# Patient Record
Sex: Male | Born: 1960 | Race: White | Hispanic: No | Marital: Married | Smoking: Never smoker
Health system: Southern US, Community
[De-identification: ages and names within clinical notes are randomized; demographics above are authoritative.]

## PROBLEM LIST (undated history)

## (undated) DIAGNOSIS — E7800 Pure hypercholesterolemia, unspecified: Secondary | ICD-10-CM

## (undated) DIAGNOSIS — I209 Angina pectoris, unspecified: Secondary | ICD-10-CM

## (undated) DIAGNOSIS — Z8619 Personal history of other infectious and parasitic diseases: Secondary | ICD-10-CM

## (undated) HISTORY — DX: Personal history of other infectious and parasitic diseases: Z86.19

---

## 2015-06-16 ENCOUNTER — Ambulatory Visit (INDEPENDENT_AMBULATORY_CARE_PROVIDER_SITE_OTHER): Payer: 59 | Admitting: Internal Medicine

## 2015-06-16 ENCOUNTER — Encounter: Payer: Self-pay | Admitting: Internal Medicine

## 2015-06-16 VITALS — BP 100/62 | HR 60 | Temp 97.7°F | Resp 18 | Ht >= 80 in | Wt 237.2 lb

## 2015-06-16 DIAGNOSIS — E7800 Pure hypercholesterolemia, unspecified: Secondary | ICD-10-CM | POA: Diagnosis not present

## 2015-06-16 DIAGNOSIS — Z0000 Encounter for general adult medical examination without abnormal findings: Secondary | ICD-10-CM

## 2015-06-16 DIAGNOSIS — Z13220 Encounter for screening for lipoid disorders: Secondary | ICD-10-CM | POA: Diagnosis not present

## 2015-06-16 DIAGNOSIS — Z23 Encounter for immunization: Secondary | ICD-10-CM

## 2015-06-16 DIAGNOSIS — Z1211 Encounter for screening for malignant neoplasm of colon: Secondary | ICD-10-CM | POA: Diagnosis not present

## 2015-06-16 DIAGNOSIS — Z125 Encounter for screening for malignant neoplasm of prostate: Secondary | ICD-10-CM | POA: Diagnosis not present

## 2015-06-16 DIAGNOSIS — Z9889 Other specified postprocedural states: Secondary | ICD-10-CM

## 2015-06-16 LAB — CBC WITH DIFFERENTIAL/PLATELET
BASOS ABS: 0 10*3/uL (ref 0.0–0.1)
BASOS PCT: 0.8 % (ref 0.0–3.0)
EOS ABS: 0.1 10*3/uL (ref 0.0–0.7)
Eosinophils Relative: 2.1 % (ref 0.0–5.0)
HEMATOCRIT: 43.1 % (ref 39.0–52.0)
Hemoglobin: 14.4 g/dL (ref 13.0–17.0)
LYMPHS ABS: 1.4 10*3/uL (ref 0.7–4.0)
LYMPHS PCT: 28.4 % (ref 12.0–46.0)
MCHC: 33.4 g/dL (ref 30.0–36.0)
MCV: 88.8 fl (ref 78.0–100.0)
MONOS PCT: 9 % (ref 3.0–12.0)
Monocytes Absolute: 0.4 10*3/uL (ref 0.1–1.0)
NEUTROS ABS: 2.9 10*3/uL (ref 1.4–7.7)
NEUTROS PCT: 59.7 % (ref 43.0–77.0)
PLATELETS: 218 10*3/uL (ref 150.0–400.0)
RBC: 4.86 Mil/uL (ref 4.22–5.81)
RDW: 12.4 % (ref 11.5–15.5)
WBC: 4.8 10*3/uL (ref 4.0–10.5)

## 2015-06-16 LAB — COMPREHENSIVE METABOLIC PANEL
ALT: 22 U/L (ref 0–53)
AST: 17 U/L (ref 0–37)
Albumin: 4.1 g/dL (ref 3.5–5.2)
Alkaline Phosphatase: 71 U/L (ref 39–117)
BUN: 18 mg/dL (ref 6–23)
CALCIUM: 8.9 mg/dL (ref 8.4–10.5)
CHLORIDE: 106 meq/L (ref 96–112)
CO2: 29 meq/L (ref 19–32)
Creatinine, Ser: 0.88 mg/dL (ref 0.40–1.50)
GFR: 95.36 mL/min (ref >60.00)
GLUCOSE: 99 mg/dL (ref 70–99)
Potassium: 4.4 mEq/L (ref 3.5–5.1)
Sodium: 141 mEq/L (ref 135–145)
Total Bilirubin: 0.7 mg/dL (ref 0.2–1.2)
Total Protein: 6.7 g/dL (ref 6.0–8.3)

## 2015-06-16 LAB — TSH: TSH: 2.03 u[IU]/mL (ref 0.35–4.50)

## 2015-06-16 LAB — LIPID PANEL
CHOL/HDL RATIO: 6
CHOLESTEROL: 212 mg/dL — AB (ref 0–200)
HDL: 33.7 mg/dL — AB (ref >39.00)
LDL CALC: 159 mg/dL — AB (ref 0–99)
NonHDL: 178.58
TRIGLYCERIDES: 97 mg/dL (ref 0.0–149.0)
VLDL: 19.4 mg/dL (ref 0.0–40.0)

## 2015-06-16 LAB — PSA: PSA: 1.46 ng/mL (ref 0.10–4.00)

## 2015-06-16 NOTE — Progress Notes (Signed)
Pre-visit discussion using our clinic review tool. No additional management support is needed unless otherwise documented below in the visit note.  

## 2015-06-16 NOTE — Progress Notes (Signed)
Patient ID: Jake Wagner, male   DOB: 02/06/1961, 55 y.o.   MRN: [id number]   Subjective:    Patient ID: Jake Wagner, male    DOB: 1960-07-26, 55 y.o.   MRN: [id number]  HPI  Patient here to establish care. He has not had a regular PCP.  He is accompanied by his wife.  History obtained from both of them.  Has been healthy.  Was previously diagnosed with pericarditis.  Was felt to be related to a virus.  S/p left meniscal tear 25-30 years ago.  Some occasional discomfort in his knee.  Overall stable.  No chest pain or tightness.  No sob.  No acid reflux.  No abdominal pain or cramping.  Bowels stable.  Exercises .  He is a Geophysicist/field seismologist.     Past Medical History  Diagnosis Date  . History of chicken pox    No past surgical history on file. Family History  Problem Relation Age of Onset  . Adopted: Yes   Social History   Social History  . Marital Status: Married    Spouse Name: N/A  . Number of Children: N/A  . Years of Education: N/A   Social History Main Topics  . Smoking status: Never Smoker   . Smokeless tobacco: Never Used  . Alcohol Use: 0.0 oz/week    0 Standard drinks or equivalent per week  . Drug Use: None  . Sexual Activity: Not Asked   Other Topics Concern  . None   Social History Narrative    No outpatient encounter prescriptions on file as of 06/16/2015.   No facility-administered encounter medications on file as of 06/16/2015.    Review of Systems  Constitutional: Negative for appetite change and unexpected weight change.  HENT: Negative for congestion and sinus pressure.   Eyes: Negative for pain and visual disturbance.  Respiratory: Negative for cough, chest tightness and shortness of breath.   Cardiovascular: Negative for chest pain, palpitations and leg swelling.  Gastrointestinal: Negative for nausea, vomiting, abdominal pain and diarrhea.  Genitourinary: Negative for dysuria and difficulty urinating.  Musculoskeletal: Negative for back pain  and joint swelling.  Skin: Negative for color change and rash.  Neurological: Negative for dizziness, light-headedness and headaches.  Hematological: Negative for adenopathy. Does not bruise/bleed easily.  Psychiatric/Behavioral: Negative for dysphoric mood and agitation.       Objective:    Physical Exam  Constitutional: He appears well-developed and well-nourished. No distress.  HENT:  Nose: Nose normal.  Mouth/Throat: Oropharynx is clear and moist.  Eyes: Conjunctivae are normal. Right eye exhibits no discharge. Left eye exhibits no discharge.  Neck: Neck supple. No thyromegaly present.  Cardiovascular: Normal rate and regular rhythm.   Pulmonary/Chest: Effort normal and breath sounds normal. No respiratory distress.  Abdominal: Soft. Bowel sounds are normal. There is no tenderness.  Musculoskeletal: He exhibits no edema or tenderness.  Lymphadenopathy:    He has no cervical adenopathy.  Skin: No rash noted. No erythema.  Psychiatric: He has a normal mood and affect. His behavior is normal.    BP 100/62 mmHg  Pulse 60  Temp(Src) 97.7 F (36.5 C) (Oral)  Resp 18  Ht 6\' 8"  (2.032 m)  Wt 237 lb 4 oz (107.616 kg)  BMI 26.06 kg/m2  SpO2 96% Wt Readings from Last 3 Encounters:  06/16/15 237 lb 4 oz (107.616 kg)         Assessment & Plan:   Problem List Items Addressed This Visit    Health  care maintenance    Will schedule him for a complete physical.  Check psa.  Check cholesterol.  Has never had colonoscopy.  Schedule appt with GI for colonoscopy.        History of knee surgery    Occasional pain, but knee overall is doing well.  Follow.        Hypercholesterolemia    Low cholesterol diet and continue exercise.  Check lipid panel.        Relevant Orders   CBC with Differential/Platelet (Completed)   Comprehensive metabolic panel (Completed)   TSH (Completed)   Lipid panel (Completed)    Other Visit Diagnoses    Colon cancer screening    -  Primary     Relevant Orders    Ambulatory referral to Gastroenterology    Screening cholesterol level        Prostate cancer screening        Relevant Orders    PSA (Completed)    Encounter for immunization            Einar Pheasant, MD

## 2015-06-17 ENCOUNTER — Encounter: Payer: Self-pay | Admitting: *Deleted

## 2015-06-22 ENCOUNTER — Encounter: Payer: Self-pay | Admitting: Internal Medicine

## 2015-06-22 DIAGNOSIS — E7800 Pure hypercholesterolemia, unspecified: Secondary | ICD-10-CM | POA: Insufficient documentation

## 2015-06-22 DIAGNOSIS — Z0000 Encounter for general adult medical examination without abnormal findings: Secondary | ICD-10-CM | POA: Insufficient documentation

## 2015-06-22 DIAGNOSIS — Z9889 Other specified postprocedural states: Secondary | ICD-10-CM | POA: Insufficient documentation

## 2015-06-22 NOTE — Assessment & Plan Note (Signed)
Low cholesterol diet and continue exercise.  Check lipid panel.

## 2015-06-22 NOTE — Assessment & Plan Note (Signed)
Will schedule him for a complete physical.  Check psa.  Check cholesterol.  Has never had colonoscopy.  Schedule appt with GI for colonoscopy.

## 2015-06-22 NOTE — Assessment & Plan Note (Signed)
Occasional pain, but knee overall is doing well.  Follow.

## 2015-08-11 LAB — HM COLONOSCOPY: HM Colonoscopy: 3

## 2015-08-19 ENCOUNTER — Encounter: Payer: Self-pay | Admitting: Internal Medicine

## 2015-08-19 DIAGNOSIS — Z86010 Personal history of colonic polyps: Secondary | ICD-10-CM | POA: Insufficient documentation

## 2015-10-14 ENCOUNTER — Ambulatory Visit (INDEPENDENT_AMBULATORY_CARE_PROVIDER_SITE_OTHER): Payer: 59 | Admitting: Internal Medicine

## 2015-10-14 ENCOUNTER — Encounter: Payer: Self-pay | Admitting: Internal Medicine

## 2015-10-14 VITALS — BP 90/60 | HR 56 | Temp 97.6°F | Resp 18 | Ht 79.0 in | Wt 233.8 lb

## 2015-10-14 DIAGNOSIS — E7800 Pure hypercholesterolemia, unspecified: Secondary | ICD-10-CM | POA: Diagnosis not present

## 2015-10-14 DIAGNOSIS — R0602 Shortness of breath: Secondary | ICD-10-CM

## 2015-10-14 DIAGNOSIS — Z0001 Encounter for general adult medical examination with abnormal findings: Secondary | ICD-10-CM

## 2015-10-14 DIAGNOSIS — Z86010 Personal history of colonic polyps: Secondary | ICD-10-CM

## 2015-10-14 DIAGNOSIS — I2510 Atherosclerotic heart disease of native coronary artery without angina pectoris: Secondary | ICD-10-CM | POA: Insufficient documentation

## 2015-10-14 DIAGNOSIS — R5383 Other fatigue: Secondary | ICD-10-CM | POA: Diagnosis not present

## 2015-10-14 DIAGNOSIS — R079 Chest pain, unspecified: Secondary | ICD-10-CM | POA: Diagnosis not present

## 2015-10-14 DIAGNOSIS — Z0000 Encounter for general adult medical examination without abnormal findings: Secondary | ICD-10-CM

## 2015-10-14 LAB — VITAMIN B12: Vitamin B-12: 378 pg/mL (ref 211–911)

## 2015-10-14 LAB — CBC WITH DIFFERENTIAL/PLATELET
Basophils Absolute: 0 10*3/uL (ref 0.0–0.1)
Basophils Relative: 0.7 % (ref 0.0–3.0)
EOS ABS: 0.2 10*3/uL (ref 0.0–0.7)
Eosinophils Relative: 3.4 % (ref 0.0–5.0)
HCT: 43.6 % (ref 39.0–52.0)
HEMOGLOBIN: 14.8 g/dL (ref 13.0–17.0)
LYMPHS ABS: 1.6 10*3/uL (ref 0.7–4.0)
Lymphocytes Relative: 30.5 % (ref 12.0–46.0)
MCHC: 33.9 g/dL (ref 30.0–36.0)
MCV: 88.1 fl (ref 78.0–100.0)
MONO ABS: 0.5 10*3/uL (ref 0.1–1.0)
Monocytes Relative: 9.4 % (ref 3.0–12.0)
NEUTROS PCT: 56 % (ref 43.0–77.0)
Neutro Abs: 3 10*3/uL (ref 1.4–7.7)
Platelets: 217 10*3/uL (ref 150.0–400.0)
RBC: 4.95 Mil/uL (ref 4.22–5.81)
RDW: 13.3 % (ref 11.5–15.5)
WBC: 5.4 10*3/uL (ref 4.0–10.5)

## 2015-10-14 LAB — COMPREHENSIVE METABOLIC PANEL
ALBUMIN: 4.2 g/dL (ref 3.5–5.2)
ALK PHOS: 78 U/L (ref 39–117)
ALT: 38 U/L (ref 0–53)
AST: 20 U/L (ref 0–37)
BILIRUBIN TOTAL: 0.7 mg/dL (ref 0.2–1.2)
BUN: 20 mg/dL (ref 6–23)
CO2: 30 mEq/L (ref 19–32)
CREATININE: 0.91 mg/dL (ref 0.40–1.50)
Calcium: 9.4 mg/dL (ref 8.4–10.5)
Chloride: 105 mEq/L (ref 96–112)
GFR: 91.63 mL/min (ref >60.00)
GLUCOSE: 95 mg/dL (ref 70–99)
Potassium: 4.4 mEq/L (ref 3.5–5.1)
SODIUM: 139 meq/L (ref 135–145)
TOTAL PROTEIN: 7 g/dL (ref 6.0–8.3)

## 2015-10-14 LAB — LIPID PANEL
CHOLESTEROL: 222 mg/dL — AB (ref 0–200)
HDL: 32.5 mg/dL — ABNORMAL LOW (ref >39.00)
LDL Cholesterol: 170 mg/dL — ABNORMAL HIGH (ref 0–99)
NONHDL: 189.22
Total CHOL/HDL Ratio: 7
Triglycerides: 94 mg/dL (ref 0.0–149.0)
VLDL: 18.8 mg/dL (ref 0.0–40.0)

## 2015-10-14 NOTE — Progress Notes (Signed)
Pre-visit discussion using our clinic review tool. No additional management support is needed unless otherwise documented below in the visit note.  

## 2015-10-14 NOTE — Patient Instructions (Signed)
Zantac (ranitidine) 150mg - take one tablet 30 minutes before breakfast.   

## 2015-10-14 NOTE — Progress Notes (Signed)
Patient ID: Jake Wagner, male   DOB: November 18, 1960, 55 y.o.   MRN: [id number]   Subjective:    Patient ID: Jake Wagner, male    DOB: [birth year], 55 y.o.   MRN: [id number]  HPI  Patient here for his physical exam.  He is accompanied by his wife.  History obtained from both of them.  He reports decreased energy.  Also reports that starting one week ago, he developed some left shoulder pain and some chest pressure.  Some constant sensation, but is intermittently worse.  The chest pressure appears to be intermittent.  Had worsening episode yesterday and took Gas X.  Helped.   No acid reflux.  No abdominal pain or cramping.  Bowels stable.  Has also noticed being more sob with stairs.  Eating and drinking.     Past Medical History  Diagnosis Date  . History of chicken pox    No past surgical history on file. Family History  Problem Relation Age of Onset  . Adopted: Yes   Social History   Social History  . Marital Status: Married    Spouse Name: N/A  . Number of Children: N/A  . Years of Education: N/A   Social History Main Topics  . Smoking status: Never Smoker   . Smokeless tobacco: Never Used  . Alcohol Use: 0.0 oz/week    0 Standard drinks or equivalent per week  . Drug Use: None  . Sexual Activity: Not Asked   Other Topics Concern  . None   Social History Narrative    No outpatient encounter prescriptions on file as of 10/14/2015.   No facility-administered encounter medications on file as of 10/14/2015.    Review of Systems  Constitutional: Negative for appetite change and unexpected weight change.  HENT: Negative for congestion and sinus pressure.   Eyes: Negative for pain and visual disturbance.  Respiratory: Positive for chest tightness. Negative for cough. Shortness of breath: has noticed with stairs.    Cardiovascular: Positive for chest pain. Negative for palpitations and leg swelling.  Gastrointestinal: Negative for nausea, vomiting, abdominal pain and  diarrhea.  Genitourinary: Negative for dysuria and difficulty urinating.  Musculoskeletal: Negative for back pain and joint swelling.  Skin: Negative for color change and rash.  Neurological: Negative for dizziness, light-headedness and headaches.  Hematological: Negative for adenopathy. Does not bruise/bleed easily.  Psychiatric/Behavioral: Negative for dysphoric mood and agitation.       Objective:     Blood pressure rechecked by me:  108/68  Physical Exam  Constitutional: He is oriented to person, place, and time. He appears well-developed and well-nourished. No distress.  HENT:  Head: Normocephalic and atraumatic.  Nose: Nose normal.  Mouth/Throat: Oropharynx is clear and moist. No oropharyngeal exudate.  Eyes: Conjunctivae are normal. Right eye exhibits no discharge. Left eye exhibits no discharge.  Neck: Neck supple. No thyromegaly present.  Cardiovascular: Normal rate and regular rhythm.   Pulmonary/Chest: Breath sounds normal. No respiratory distress. He has no wheezes.  Abdominal: Soft. Bowel sounds are normal. There is no tenderness.  Genitourinary:  Rectal exam:  No palpable prostate nodules.  Heme negative.   Musculoskeletal: He exhibits no edema or tenderness.  Lymphadenopathy:    He has no cervical adenopathy.  Neurological: He is alert and oriented to person, place, and time.  Skin: Skin is warm and dry. No rash noted. No erythema.  Psychiatric: He has a normal mood and affect. His behavior is normal.    BP 90/60 mmHg  Pulse  56  Temp(Src) 97.6 F (36.4 C) (Oral)  Resp 18  Ht _0  (2.007 m)  Wt 233 lb 12 oz (106.028 kg)  BMI 26.32 kg/m2  SpO2 97% Wt Readings from Last 3 Encounters:  10/14/15 233 lb 12 oz (106.028 kg)  06/16/15 237 lb 4 oz (107.616 kg)     Lab Results  Component Value Date   WBC 5.4 10/14/2015   HGB 14.8 10/14/2015   HCT 43.6 10/14/2015   PLT 217.0 10/14/2015   GLUCOSE 95 10/14/2015   CHOL 222* 10/14/2015   TRIG 94.0 10/14/2015     HDL 32.50* 10/14/2015   LDLCALC 170* 10/14/2015   ALT 38 10/14/2015   AST 20 10/14/2015   NA 139 10/14/2015   K 4.4 10/14/2015   CL 105 10/14/2015   CREATININE 0.91 10/14/2015   BUN 20 10/14/2015   CO2 30 10/14/2015   TSH 2.03 06/16/2015   PSA 1.46 06/16/2015       Assessment & Plan:   Problem List Items Addressed This Visit    Chest pain - Primary    Describes the chest tightness and shoulder discomfort as outlined.  No reproducible pain on exam.  Good rom.  No pain with movement of palpation.  EKG with SR and TWI in V1 and V2.  Given persistent intermittent symptoms and sob with stairs, I do feel further cardiac w/up warranted.  Refer to cardiology for evaluation.  Will also start zantac 138m q day.  Follow.       Relevant Orders   EKG 12-Lead (Completed)   Ambulatory referral to Cardiology   Fatigue    Increased fatigue.  Decreased energy.  Unclear etiology.  States sleeps well.  Recent tsh wnl.  Check met c, cbc and B12.  Will also pursue cardiac w/up as outlined.        Relevant Orders   Comprehensive metabolic panel (Completed)   CBC with Differential/Platelet (Completed)   Vitamin B12 (Completed)   Health care maintenance    Physical today 10/14/15.  PSA 06/16/15 1.46.  Colonoscopy 08/11/15 as outlined.       History of colonic polyps    Colonoscopy as outlined - 08/11/15.  States due f/u in 10 years.       Hypercholesterolemia    Cholesterol elevated last check.  Had discussed diet and exercise.  Recheck cholesterol levels today.  If persistent elevation, will need to start medication.        Relevant Orders   Lipid panel (Completed)    Other Visit Diagnoses    SOB (shortness of breath)        Relevant Orders    EKG 12-Lead (Completed)        SEinar Pheasant MD

## 2015-10-15 ENCOUNTER — Encounter: Payer: Self-pay | Admitting: *Deleted

## 2015-10-15 ENCOUNTER — Encounter: Payer: Self-pay | Admitting: Internal Medicine

## 2015-10-15 NOTE — Assessment & Plan Note (Signed)
Describes the chest tightness and shoulder discomfort as outlined.  No reproducible pain on exam.  Good rom.  No pain with movement of palpation.  EKG with SR and TWI in V1 and V2.  Given persistent intermittent symptoms and sob with stairs, I do feel further cardiac w/up warranted.  Refer to cardiology for evaluation.  Will also start zantac 150mg  q day.  Follow.

## 2015-10-15 NOTE — Assessment & Plan Note (Signed)
Colonoscopy as outlined - 08/11/15.  States due f/u in 10 years.

## 2015-10-15 NOTE — Assessment & Plan Note (Signed)
Physical today 10/14/15.  PSA 06/16/15 1.46.  Colonoscopy 08/11/15 as outlined.

## 2015-10-15 NOTE — Assessment & Plan Note (Signed)
Cholesterol elevated last check.  Had discussed diet and exercise.  Recheck cholesterol levels today.  If persistent elevation, will need to start medication.

## 2015-10-15 NOTE — Assessment & Plan Note (Signed)
Increased fatigue.  Decreased energy.  Unclear etiology.  States sleeps well.  Recent tsh wnl.  Check met c, cbc and B12.  Will also pursue cardiac w/up as outlined.

## 2015-10-16 ENCOUNTER — Other Ambulatory Visit: Payer: Self-pay | Admitting: Internal Medicine

## 2015-10-16 MED ORDER — ROSUVASTATIN CALCIUM 5 MG PO TABS: 5.0000 mg | ORAL_TABLET | Freq: Every day | ORAL | Status: DC

## 2015-12-07 ENCOUNTER — Other Ambulatory Visit: Payer: Self-pay

## 2015-12-07 MED ORDER — ROSUVASTATIN CALCIUM 5 MG PO TABS: 5.0000 mg | ORAL_TABLET | Freq: Every day | ORAL | Status: DC

## 2016-01-07 ENCOUNTER — Encounter
Admission: RE | Disposition: A | Discharge status: Home / Self Care | Payer: Self-pay | Source: Ambulatory Visit | Attending: Internal Medicine

## 2016-01-07 ENCOUNTER — Encounter: Payer: Self-pay | Admitting: *Deleted

## 2016-01-07 ENCOUNTER — Ambulatory Visit
Admission: RE | Admit: 2016-01-07 | Discharge: 2016-01-07 | Disposition: A | Discharge status: Home / Self Care | Payer: 59 | Source: Ambulatory Visit | Attending: Internal Medicine | Admitting: Internal Medicine

## 2016-01-07 DIAGNOSIS — Z79899 Other long term (current) drug therapy: Secondary | ICD-10-CM | POA: Diagnosis not present

## 2016-01-07 DIAGNOSIS — R5383 Other fatigue: Secondary | ICD-10-CM | POA: Diagnosis not present

## 2016-01-07 DIAGNOSIS — K219 Gastro-esophageal reflux disease without esophagitis: Secondary | ICD-10-CM | POA: Insufficient documentation

## 2016-01-07 DIAGNOSIS — R079 Chest pain, unspecified: Secondary | ICD-10-CM | POA: Diagnosis not present

## 2016-01-07 DIAGNOSIS — Z9889 Other specified postprocedural states: Secondary | ICD-10-CM | POA: Insufficient documentation

## 2016-01-07 DIAGNOSIS — I25110 Atherosclerotic heart disease of native coronary artery with unstable angina pectoris: Secondary | ICD-10-CM | POA: Diagnosis not present

## 2016-01-07 DIAGNOSIS — Z7982 Long term (current) use of aspirin: Secondary | ICD-10-CM | POA: Diagnosis not present

## 2016-01-07 DIAGNOSIS — I200 Unstable angina: Secondary | ICD-10-CM | POA: Diagnosis present

## 2016-01-07 DIAGNOSIS — Z881 Allergy status to other antibiotic agents status: Secondary | ICD-10-CM | POA: Diagnosis not present

## 2016-01-07 DIAGNOSIS — E785 Hyperlipidemia, unspecified: Secondary | ICD-10-CM | POA: Diagnosis not present

## 2016-01-07 HISTORY — PX: CARDIAC CATHETERIZATION: SHX172

## 2016-01-07 HISTORY — DX: Angina pectoris, unspecified: I20.9

## 2016-01-07 HISTORY — DX: Pure hypercholesterolemia, unspecified: E78.00

## 2016-01-07 SURGERY — LEFT HEART CATH AND CORONARY ANGIOGRAPHY
Anesthesia: Moderate Sedation | Laterality: Left

## 2016-01-07 SURGERY — LEFT HEART CATH AND CORONARY ANGIOGRAPHY
Anesthesia: Moderate Sedation

## 2016-01-07 MED ORDER — MIDAZOLAM HCL 2 MG/2ML IJ SOLN
INTRAMUSCULAR | Status: DC | PRN
  Administered 2016-01-07: 0.5 mg via INTRAVENOUS

## 2016-01-07 MED ORDER — SODIUM CHLORIDE 0.9% FLUSH: 3.0000 mL | Freq: Two times a day (BID) | INTRAVENOUS | Status: DC

## 2016-01-07 MED ORDER — MIDAZOLAM HCL 2 MG/2ML IJ SOLN
INTRAMUSCULAR | Status: AC
  Filled 2016-01-07: qty 2

## 2016-01-07 MED ORDER — SODIUM CHLORIDE 0.9 % WEIGHT BASED INFUSION: 1.0000 mL/kg/h | INTRAVENOUS | Status: DC

## 2016-01-07 MED ORDER — ASPIRIN 81 MG PO CHEW: 81.0000 mg | CHEWABLE_TABLET | ORAL | Status: DC

## 2016-01-07 MED ORDER — SODIUM CHLORIDE 0.9 % WEIGHT BASED INFUSION
3.0000 mL/kg/h | INTRAVENOUS | Status: DC
Start: 2016-01-08 — End: 2016-01-07

## 2016-01-07 MED ORDER — FENTANYL CITRATE (PF) 100 MCG/2ML IJ SOLN
INTRAMUSCULAR | Status: DC | PRN
  Administered 2016-01-07: 50 ug via INTRAVENOUS
  Administered 2016-01-07: 25 ug via INTRAVENOUS

## 2016-01-07 MED ORDER — ASPIRIN 81 MG PO CHEW
CHEWABLE_TABLET | ORAL | Status: AC
  Filled 2016-01-07: qty 4

## 2016-01-07 MED ORDER — TICAGRELOR 90 MG PO TABS
ORAL_TABLET | ORAL | Status: AC
  Filled 2016-01-07: qty 2

## 2016-01-07 MED ORDER — NITROGLYCERIN 5 MG/ML IV SOLN
INTRAVENOUS | Status: AC
  Filled 2016-01-07: qty 10

## 2016-01-07 MED ORDER — CLOPIDOGREL BISULFATE 75 MG PO TABS
ORAL_TABLET | ORAL | Status: AC
  Administered 2016-01-07: 600 mg via ORAL
  Filled 2016-01-07: qty 6

## 2016-01-07 MED ORDER — SODIUM CHLORIDE 0.9 % IV SOLN: 250.0000 mL | INTRAVENOUS | Status: DC | PRN

## 2016-01-07 MED ORDER — SODIUM CHLORIDE 0.9% FLUSH: 3.0000 mL | INTRAVENOUS | Status: DC | PRN

## 2016-01-07 MED ORDER — HEPARIN (PORCINE) IN NACL 2-0.9 UNIT/ML-% IJ SOLN
INTRAMUSCULAR | Status: AC
  Filled 2016-01-07: qty 500

## 2016-01-07 MED ORDER — FENTANYL CITRATE (PF) 100 MCG/2ML IJ SOLN
INTRAMUSCULAR | Status: AC
  Filled 2016-01-07: qty 2

## 2016-01-07 MED ORDER — CLOPIDOGREL BISULFATE 75 MG PO TABS
600.0000 mg | ORAL_TABLET | Freq: Once | ORAL | Status: AC
  Administered 2016-01-07: 600 mg via ORAL

## 2016-01-07 MED ORDER — BIVALIRUDIN 250 MG IV SOLR
INTRAVENOUS | Status: AC
  Filled 2016-01-07: qty 250

## 2016-01-07 SURGICAL SUPPLY — 11 items
CATH INFINITI 5FR ANG PIGTAIL (CATHETERS) ×2 IMPLANT
CATH INFINITI 5FR JL4 (CATHETERS) ×2 IMPLANT
CATH INFINITI JR4 5F (CATHETERS) ×2 IMPLANT
DEVICE CLOSURE MYNXGRIP 5F (Vascular Products) ×2 IMPLANT
DEVICE INFLAT 30 PLUS (MISCELLANEOUS) IMPLANT
KIT MANI 3VAL PERCEP (MISCELLANEOUS) ×2 IMPLANT
NEEDLE PERC 18GX7CM (NEEDLE) ×2 IMPLANT
PACK CARDIAC CATH (CUSTOM PROCEDURE TRAY) ×2 IMPLANT
SHEATH AVANTI 5FR X 11CM (SHEATH) ×2 IMPLANT
SHEATH AVANTI 6FR X 11CM (SHEATH) IMPLANT
WIRE EMERALD 3MM-J .035X150CM (WIRE) ×2 IMPLANT

## 2016-01-07 NOTE — OR Nursing (Signed)
Patient took prednisone 60mg , benadryl 50mg  and zantac 150 mg this am at [facility]  for possible contrast reaction. Patient has had 4 doses of these meds, starting at 3PM yesterday.

## 2016-01-11 ENCOUNTER — Other Ambulatory Visit: Payer: 59

## 2016-01-11 ENCOUNTER — Other Ambulatory Visit: Payer: Self-pay | Admitting: Internal Medicine

## 2016-01-11 DIAGNOSIS — E7800 Pure hypercholesterolemia, unspecified: Secondary | ICD-10-CM

## 2016-01-11 NOTE — Addendum Note (Signed)
Addended by: Frutoso Chase A on: 01/11/2016 11:00 AM   Modules accepted: Orders

## 2016-02-18 ENCOUNTER — Ambulatory Visit
Admission: RE | Admit: 2016-02-18 | Discharge: 2016-02-18 | Disposition: A | Discharge status: Home / Self Care | Payer: 59 | Source: Ambulatory Visit | Attending: Internal Medicine | Admitting: Internal Medicine

## 2016-02-18 ENCOUNTER — Ambulatory Visit (INDEPENDENT_AMBULATORY_CARE_PROVIDER_SITE_OTHER): Payer: 59 | Admitting: Internal Medicine

## 2016-02-18 ENCOUNTER — Encounter: Payer: Self-pay | Admitting: Internal Medicine

## 2016-02-18 ENCOUNTER — Other Ambulatory Visit (INDEPENDENT_AMBULATORY_CARE_PROVIDER_SITE_OTHER): Payer: 59

## 2016-02-18 DIAGNOSIS — I2510 Atherosclerotic heart disease of native coronary artery without angina pectoris: Secondary | ICD-10-CM | POA: Diagnosis not present

## 2016-02-18 DIAGNOSIS — R42 Dizziness and giddiness: Secondary | ICD-10-CM

## 2016-02-18 DIAGNOSIS — E7800 Pure hypercholesterolemia, unspecified: Secondary | ICD-10-CM

## 2016-02-18 DIAGNOSIS — M19011 Primary osteoarthritis, right shoulder: Secondary | ICD-10-CM | POA: Diagnosis not present

## 2016-02-18 DIAGNOSIS — M25511 Pain in right shoulder: Secondary | ICD-10-CM | POA: Insufficient documentation

## 2016-02-18 DIAGNOSIS — Z23 Encounter for immunization: Secondary | ICD-10-CM | POA: Diagnosis not present

## 2016-02-18 LAB — HEPATIC FUNCTION PANEL
ALK PHOS: 84 U/L (ref 39–117)
ALT: 43 U/L (ref 0–53)
AST: 25 U/L (ref 0–37)
Albumin: 4.1 g/dL (ref 3.5–5.2)
BILIRUBIN TOTAL: 0.6 mg/dL (ref 0.2–1.2)
Bilirubin, Direct: 0 mg/dL (ref 0.0–0.3)
Total Protein: 7.1 g/dL (ref 6.0–8.3)

## 2016-02-18 LAB — BASIC METABOLIC PANEL
BUN: 22 mg/dL (ref 6–23)
CALCIUM: 9 mg/dL (ref 8.4–10.5)
CO2: 31 mEq/L (ref 19–32)
Chloride: 106 mEq/L (ref 96–112)
Creatinine, Ser: 0.94 mg/dL (ref 0.40–1.50)
GFR: 88.15 mL/min (ref >60.00)
Glucose, Bld: 103 mg/dL — ABNORMAL HIGH (ref 70–99)
POTASSIUM: 4.5 meq/L (ref 3.5–5.1)
SODIUM: 139 meq/L (ref 135–145)

## 2016-02-18 LAB — LIPID PANEL
CHOL/HDL RATIO: 3
Cholesterol: 114 mg/dL (ref 0–200)
HDL: 34 mg/dL — ABNORMAL LOW (ref >39.00)
LDL CALC: 65 mg/dL (ref 0–99)
NonHDL: 79.9
Triglycerides: 73 mg/dL (ref 0.0–149.0)
VLDL: 14.6 mg/dL (ref 0.0–40.0)

## 2016-02-18 NOTE — Progress Notes (Signed)
Pre visit review using our clinic review tool, if applicable. No additional management support is needed unless otherwise documented below in the visit note. 

## 2016-02-18 NOTE — Progress Notes (Signed)
Patient ID: Jake Wagner, male   DOB: 1961/05/01, 55 y.o.   MRN: [id number]   Subjective:    Patient ID: Jake Wagner, male    DOB: 1960/08/25, 55 y.o.   MRN: [id number]  HPI  Patient here for a scheduled follow up.  With known CAD - 99% mid LAD lesion.  D/p PCI 01/19/16 - two stents placed mid LAD.  Followed by cardiology.  Last evaluated 01/21/16.  States he is doing well.  Went for his initial assessment for cardiac rehab.  No chest pain.  Breathing better.  Eating and drinking well.  Taking his medications.  Tolerating statin medication.  No nausea or vomiting.  No abdominal pain or cramping.  Groin hematoma completely resolved.  Does report some right shoulder pain.  Persistent.  Some increased discomfort with abduction of his arm and with reaching posteriorly.  Some light headedness when he stands up.     Past Medical History:  Diagnosis Date  . Anginal pain ([place])   . History of chicken pox   . Hypercholesteremia    Past Surgical History:  Procedure Laterality Date  . CARDIAC CATHETERIZATION Left 01/07/2016   Procedure: Left Heart Cath and Coronary Angiography;  Surgeon: Yolonda Kida, MD;  Location: [facility];  Service: Cardiovascular;  Laterality: Left;   Family History  Problem Relation Age of Onset  . Adopted: Yes   Social History   Social History  . Marital status: Married    Spouse name: N/A  . Number of children: N/A  . Years of education: N/A   Social History Main Topics  . Smoking status: Never Smoker  . Smokeless tobacco: Never Used  . Alcohol use 0.0 oz/week  . Drug use: Unknown  . Sexual activity: Not Asked   Other Topics Concern  . None   Social History Narrative  . None    Outpatient Encounter Prescriptions as of 02/18/2016  Medication Sig  . aspirin 81 MG EC tablet Take 81 mg by mouth daily.  . clopidogrel (PLAVIX) 75 MG tablet Take 75 mg by mouth daily.  . metoprolol succinate (TOPROL-XL) 25 MG 24 hr tablet Take 25 mg by mouth  daily.  . nitroGLYCERIN (NITROSTAT) 0.4 MG SL tablet Place under the tongue.  . rosuvastatin (CRESTOR) 5 MG tablet Take 1 tablet (5 mg total) by mouth daily. (Patient taking differently: Take 20 mg by mouth daily. )  . [DISCONTINUED] isosorbide mononitrate (IMDUR) 30 MG 24 hr tablet Take 30 mg by mouth daily.  . [DISCONTINUED] metoprolol tartrate (LOPRESSOR) 25 MG tablet Take 25 mg by mouth 2 (two) times daily.   No facility-administered encounter medications on file as of 02/18/2016.     Review of Systems  Constitutional: Negative for appetite change and unexpected weight change.  HENT: Negative for congestion and sinus pressure.   Respiratory: Negative for cough, chest tightness and shortness of breath.   Cardiovascular: Negative for chest pain, palpitations and leg swelling.  Gastrointestinal: Negative for abdominal pain, diarrhea, nausea and vomiting.  Genitourinary: Negative for difficulty urinating and dysuria.  Musculoskeletal: Negative for myalgias.       Right shoulder pain as outlined.    Skin: Negative for color change and rash.  Neurological: Positive for light-headedness. Negative for dizziness and headaches.  Psychiatric/Behavioral: Negative for agitation and dysphoric mood.       Objective:    Physical Exam  Constitutional: He appears well-developed and well-nourished. No distress.  HENT:  Nose: Nose normal.  Mouth/Throat: Oropharynx is  clear and moist.  Neck: Neck supple. No thyromegaly present.  Cardiovascular: Normal rate and regular rhythm.   Pulmonary/Chest: Effort normal and breath sounds normal. No respiratory distress.  Abdominal: Soft. Bowel sounds are normal. There is no tenderness.  Musculoskeletal: He exhibits no edema or tenderness.  Increased pain right shoulder - reaching posteriorly.   Lymphadenopathy:    He has no cervical adenopathy.  Skin: No rash noted. No erythema.  Psychiatric: He has a normal mood and affect. His behavior is normal.     BP 110/60   Pulse 75   Temp 98.2 F (36.8 C) (Oral)   Ht 6\' 7"  (2.007 m)   Wt 232 lb 6.4 oz (105.4 kg)   SpO2 96%   BMI 26.18 kg/m  Wt Readings from Last 3 Encounters:  02/18/16 232 lb 6.4 oz (105.4 kg)  01/07/16 225 lb (102.1 kg)  10/14/15 233 lb 12 oz (106 kg)     Lab Results  Component Value Date   WBC 5.4 10/14/2015   HGB 14.8 10/14/2015   HCT 43.6 10/14/2015   PLT 217.0 10/14/2015   GLUCOSE 103 (H) 02/18/2016   CHOL 114 02/18/2016   TRIG 73.0 02/18/2016   HDL 34.00 (L) 02/18/2016   LDLCALC 65 02/18/2016   ALT 43 02/18/2016   AST 25 02/18/2016   NA 139 02/18/2016   K 4.5 02/18/2016   CL 106 02/18/2016   CREATININE 0.94 02/18/2016   BUN 22 02/18/2016   CO2 31 02/18/2016   TSH 2.03 06/16/2015   PSA 1.46 06/16/2015       Assessment & Plan:   Problem List Items Addressed This Visit    CAD (coronary artery disease)    S/p stent x 2 LAD.  Currently doing well.  Planning to start rehab.  Continue aggressive risk factor modification.        Relevant Medications   metoprolol succinate (TOPROL-XL) 25 MG 24 hr tablet   nitroGLYCERIN (NITROSTAT) 0.4 MG SL tablet   Other Relevant Orders   Basic metabolic panel   Hypercholesterolemia    On crestor and tolerating.  Low cholesterol diet and exercise.  Follow lipid panel and liver function tests.        Relevant Medications   metoprolol succinate (TOPROL-XL) 25 MG 24 hr tablet   nitroGLYCERIN (NITROSTAT) 0.4 MG SL tablet   Other Relevant Orders   Hepatic function panel   Lipid panel   Light headedness    Blood pressure sitting 104/68-70.  Dropped mid to lower 90s standing.  Will decrease metoprolol to 25mg  1/2 tablet q day.  Follow pressures.  Follow metabolic panel.        Right shoulder pain    Persistent.  Will check xray.  Tylenol for pain.       Relevant Orders   DG Shoulder Right (Completed)    Other Visit Diagnoses   None.      Einar Pheasant, MD

## 2016-02-18 NOTE — Patient Instructions (Signed)
-  Decrease metoprolol to 1/2 tablet per day

## 2016-02-19 ENCOUNTER — Encounter: Payer: Self-pay | Admitting: Internal Medicine

## 2016-02-21 ENCOUNTER — Encounter: Payer: Self-pay | Admitting: Internal Medicine

## 2016-02-21 DIAGNOSIS — R42 Dizziness and giddiness: Secondary | ICD-10-CM | POA: Insufficient documentation

## 2016-02-21 NOTE — Assessment & Plan Note (Signed)
On crestor and tolerating.  Low cholesterol diet and exercise.  Follow lipid panel and liver function tests.   

## 2016-02-21 NOTE — Assessment & Plan Note (Addendum)
Persistent.  Will check xray.  Tylenol for pain.

## 2016-02-21 NOTE — Assessment & Plan Note (Signed)
S/p stent x 2 LAD.  Currently doing well.  Planning to start rehab.  Continue aggressive risk factor modification.

## 2016-02-21 NOTE — Assessment & Plan Note (Signed)
Blood pressure sitting 104/68-70.  Dropped mid to lower 90s standing.  Will decrease metoprolol to 25mg  1/2 tablet q day.  Follow pressures.  Follow metabolic panel.

## 2016-03-16 DIAGNOSIS — M7501 Adhesive capsulitis of right shoulder: Secondary | ICD-10-CM | POA: Insufficient documentation

## 2016-04-27 DIAGNOSIS — I5189 Other ill-defined heart diseases: Secondary | ICD-10-CM | POA: Insufficient documentation

## 2016-04-27 DIAGNOSIS — I77810 Thoracic aortic ectasia: Secondary | ICD-10-CM | POA: Insufficient documentation

## 2016-05-12 ENCOUNTER — Encounter: Payer: Self-pay | Admitting: Internal Medicine

## 2016-05-12 ENCOUNTER — Other Ambulatory Visit (INDEPENDENT_AMBULATORY_CARE_PROVIDER_SITE_OTHER): Payer: 59

## 2016-05-12 DIAGNOSIS — I2510 Atherosclerotic heart disease of native coronary artery without angina pectoris: Secondary | ICD-10-CM

## 2016-05-12 DIAGNOSIS — E7800 Pure hypercholesterolemia, unspecified: Secondary | ICD-10-CM

## 2016-05-12 LAB — LIPID PANEL
CHOL/HDL RATIO: 3
Cholesterol: 126 mg/dL (ref 0–200)
HDL: 38.7 mg/dL — AB (ref >39.00)
LDL CALC: 72 mg/dL (ref 0–99)
NONHDL: 87.15
Triglycerides: 78 mg/dL (ref 0.0–149.0)
VLDL: 15.6 mg/dL (ref 0.0–40.0)

## 2016-05-12 LAB — BASIC METABOLIC PANEL
BUN: 17 mg/dL (ref 6–23)
CALCIUM: 9.5 mg/dL (ref 8.4–10.5)
CO2: 32 meq/L (ref 19–32)
CREATININE: 0.91 mg/dL (ref 0.40–1.50)
Chloride: 104 mEq/L (ref 96–112)
GFR: 91.44 mL/min (ref >60.00)
Glucose, Bld: 106 mg/dL — ABNORMAL HIGH (ref 70–99)
Potassium: 4.6 mEq/L (ref 3.5–5.1)
SODIUM: 140 meq/L (ref 135–145)

## 2016-05-12 LAB — HEPATIC FUNCTION PANEL
ALK PHOS: 76 U/L (ref 39–117)
ALT: 36 U/L (ref 0–53)
AST: 21 U/L (ref 0–37)
Albumin: 4.4 g/dL (ref 3.5–5.2)
BILIRUBIN DIRECT: 0.1 mg/dL (ref 0.0–0.3)
BILIRUBIN TOTAL: 0.7 mg/dL (ref 0.2–1.2)
Total Protein: 7.1 g/dL (ref 6.0–8.3)

## 2016-05-16 ENCOUNTER — Encounter: Payer: Self-pay | Admitting: Internal Medicine

## 2016-05-16 ENCOUNTER — Ambulatory Visit (INDEPENDENT_AMBULATORY_CARE_PROVIDER_SITE_OTHER): Payer: 59 | Admitting: Internal Medicine

## 2016-05-16 ENCOUNTER — Other Ambulatory Visit: Payer: 59

## 2016-05-16 VITALS — BP 118/64 | HR 62 | Temp 97.9°F | Ht 79.0 in | Wt 225.8 lb

## 2016-05-16 DIAGNOSIS — R42 Dizziness and giddiness: Secondary | ICD-10-CM | POA: Diagnosis not present

## 2016-05-16 DIAGNOSIS — E7800 Pure hypercholesterolemia, unspecified: Secondary | ICD-10-CM | POA: Diagnosis not present

## 2016-05-16 DIAGNOSIS — Z125 Encounter for screening for malignant neoplasm of prostate: Secondary | ICD-10-CM

## 2016-05-16 DIAGNOSIS — I2510 Atherosclerotic heart disease of native coronary artery without angina pectoris: Secondary | ICD-10-CM

## 2016-05-16 DIAGNOSIS — K649 Unspecified hemorrhoids: Secondary | ICD-10-CM | POA: Diagnosis not present

## 2016-05-16 MED ORDER — HYDROCORTISONE ACE-PRAMOXINE 1-1 % RE CREA: 1.0000 "application " | TOPICAL_CREAM | Freq: Two times a day (BID) | RECTAL | 0 refills | Status: DC

## 2016-05-16 NOTE — Progress Notes (Signed)
Patient ID: Jake Wagner, male   DOB: 08-14-1960, 55 y.o.   MRN: [id number]   Subjective:    Patient ID: Jake Wagner, male    DOB: [birth year], 55 y.o.   MRN: [id number]  HPI  Patient here for a scheduled follow up.  He is accompanied by his wife.  History obtained from both of them.  Has a history of CAD (12/2015 LAD PCI), dilated aortic root, diastolic dysfunction and dyslipidemia.  Sees cardiology.  Going to cardiac rehab.  No chest pain.  No sob.  Cardiology stopped his metoprolol.  He has been off two weeks.  Overall he feels he is doing well.  Did have issues with frozen shoulder.  Went to physical therapy for this as well.  Doing better.  Eating and drinking well.  No nausea or vomiting.  Bowels stable.  Is having problems with hemorrhoids.  Flares intermittently.     Past Medical History:  Diagnosis Date  . Anginal pain ([place])   . History of chicken pox   . Hypercholesteremia    Past Surgical History:  Procedure Laterality Date  . CARDIAC CATHETERIZATION Left 01/07/2016   Procedure: Left Heart Cath and Coronary Angiography;  Surgeon: Yolonda Kida, MD;  Location: [facility];  Service: Cardiovascular;  Laterality: Left;   Family History  Problem Relation Age of Onset  . Adopted: Yes   Social History   Social History  . Marital status: Married    Spouse name: N/A  . Number of children: N/A  . Years of education: N/A   Social History Main Topics  . Smoking status: Never Smoker  . Smokeless tobacco: Never Used  . Alcohol use 0.0 oz/week  . Drug use: Unknown  . Sexual activity: Not Asked   Other Topics Concern  . None   Social History Narrative  . None    Outpatient Encounter Prescriptions as of 05/16/2016  Medication Sig  . aspirin 81 MG EC tablet Take 81 mg by mouth daily.  . clopidogrel (PLAVIX) 75 MG tablet Take 75 mg by mouth daily.  . nitroGLYCERIN (NITROSTAT) 0.4 MG SL tablet Place under the tongue.  . rosuvastatin (CRESTOR) 5 MG tablet  Take 1 tablet (5 mg total) by mouth daily. (Patient taking differently: Take 20 mg by mouth daily. )  . pramoxine-hydrocortisone (ANALPRAM-HC) 1-1 % rectal cream Place 1 application rectally 2 (two) times daily.  . [DISCONTINUED] metoprolol succinate (TOPROL-XL) 25 MG 24 hr tablet Take 25 mg by mouth daily.   No facility-administered encounter medications on file as of 05/16/2016.     Review of Systems  Constitutional: Negative for appetite change and unexpected weight change.  HENT: Negative for congestion and sinus pressure.   Respiratory: Negative for cough, chest tightness and shortness of breath.   Cardiovascular: Negative for chest pain, palpitations and leg swelling.  Gastrointestinal: Negative for abdominal pain, diarrhea, nausea and vomiting.  Genitourinary: Negative for difficulty urinating and dysuria.  Musculoskeletal: Negative for joint swelling.       Shoulder is doing better.   Skin: Negative for color change and rash.  Neurological: Negative for dizziness, light-headedness and headaches.  Psychiatric/Behavioral: Negative for agitation and dysphoric mood.       Objective:    Physical Exam  Constitutional: He appears well-developed and well-nourished. No distress.  HENT:  Nose: Nose normal.  Mouth/Throat: Oropharynx is clear and moist.  Neck: Neck supple. No thyromegaly present.  Cardiovascular: Normal rate and regular rhythm.   Pulmonary/Chest: Effort normal and  breath sounds normal. No respiratory distress.  Abdominal: Soft. Bowel sounds are normal. There is no tenderness.  Genitourinary:  Genitourinary Comments: Rectal - external irritation/hemorrhoid.  No thrombosed hemorrhoid.    Musculoskeletal: He exhibits no edema or tenderness.  Lymphadenopathy:    He has no cervical adenopathy.  Skin: No rash noted. No erythema.  Psychiatric: He has a normal mood and affect. His behavior is normal.    BP 118/64   Pulse 62   Temp 97.9 F (36.6 C) (Oral)   Ht 6\' 7"   (2.007 m)   Wt 225 lb 12.8 oz (102.4 kg)   SpO2 97%   BMI 25.44 kg/m  Wt Readings from Last 3 Encounters:  05/16/16 225 lb 12.8 oz (102.4 kg)  02/18/16 232 lb 6.4 oz (105.4 kg)  01/07/16 225 lb (102.1 kg)     Lab Results  Component Value Date   WBC 5.4 10/14/2015   HGB 14.8 10/14/2015   HCT 43.6 10/14/2015   PLT 217.0 10/14/2015   GLUCOSE 106 (H) 05/12/2016   CHOL 126 05/12/2016   TRIG 78.0 05/12/2016   HDL 38.70 (L) 05/12/2016   LDLCALC 72 05/12/2016   ALT 36 05/12/2016   AST 21 05/12/2016   NA 140 05/12/2016   K 4.6 05/12/2016   CL 104 05/12/2016   CREATININE 0.91 05/12/2016   BUN 17 05/12/2016   CO2 32 05/12/2016   TSH 2.03 06/16/2015   PSA 1.46 06/16/2015    Dg Shoulder Right  Result Date: 02/18/2016 CLINICAL DATA:  Pain.  Moving injury. EXAM: RIGHT SHOULDER - 2+ VIEW COMPARISON:  No prior. FINDINGS: Glenohumeral degenerative change. No evidence of fracture, dislocation, or separation. Apical pleural thickening noted consistent scarring. Punctate bony density noted in the right humeral head, most likely a bone island. IMPRESSION: Glenohumeral degenerative change.  No acute abnormality. Electronically Signed   By: Marcello Moores  Register   On: 02/18/2016 14:17       Assessment & Plan:   Problem List Items Addressed This Visit    CAD (coronary artery disease)    S/p stent x 2 LAD. Going to cardiac rehab.  Currently doing well.  Continue risk factor modification.        Hypercholesterolemia    On crestor and tolerating.  Low cholesterol diet and exercise.  Follow lipid panel and liver function tests.  LDL - 72 on recent check.        Relevant Orders   Lipid panel   Hepatic function panel   Basic metabolic panel   Light headedness    Off metoprolol now.  No significant issues with light headedness or dizziness.  Follow.         Other Visit Diagnoses    Hemorrhoids, unspecified hemorrhoid type    -  Primary   analpram.  follow.  notify me if persistent problems.     Prostate cancer screening       Relevant Orders   PSA       Einar Pheasant, MD

## 2016-05-16 NOTE — Progress Notes (Signed)
Pre visit review using our clinic review tool, if applicable. No additional management support is needed unless otherwise documented below in the visit note. 

## 2016-05-18 DIAGNOSIS — G8929 Other chronic pain: Secondary | ICD-10-CM | POA: Insufficient documentation

## 2016-05-20 ENCOUNTER — Ambulatory Visit: Payer: 59 | Admitting: Internal Medicine

## 2016-05-26 ENCOUNTER — Encounter: Payer: Self-pay | Admitting: Internal Medicine

## 2016-05-26 NOTE — Assessment & Plan Note (Signed)
Off metoprolol now.  No significant issues with light headedness or dizziness.  Follow.

## 2016-05-26 NOTE — Assessment & Plan Note (Signed)
S/p stent x 2 LAD. Going to cardiac rehab.  Currently doing well.  Continue risk factor modification.

## 2016-05-26 NOTE — Assessment & Plan Note (Signed)
On crestor and tolerating.  Low cholesterol diet and exercise.  Follow lipid panel and liver function tests.  LDL - 72 on recent check.

## 2016-10-14 ENCOUNTER — Other Ambulatory Visit (INDEPENDENT_AMBULATORY_CARE_PROVIDER_SITE_OTHER): Payer: 59

## 2016-10-14 DIAGNOSIS — Z125 Encounter for screening for malignant neoplasm of prostate: Secondary | ICD-10-CM

## 2016-10-14 DIAGNOSIS — E7800 Pure hypercholesterolemia, unspecified: Secondary | ICD-10-CM

## 2016-10-14 LAB — BASIC METABOLIC PANEL
BUN: 19 mg/dL (ref 6–23)
CHLORIDE: 104 meq/L (ref 96–112)
CO2: 30 mEq/L (ref 19–32)
CREATININE: 0.87 mg/dL (ref 0.40–1.50)
Calcium: 9.3 mg/dL (ref 8.4–10.5)
GFR: 96.16 mL/min (ref >60.00)
Glucose, Bld: 98 mg/dL (ref 70–99)
POTASSIUM: 4.7 meq/L (ref 3.5–5.1)
Sodium: 138 mEq/L (ref 135–145)

## 2016-10-14 LAB — PSA: PSA: 1.37 ng/mL (ref 0.10–4.00)

## 2016-10-14 LAB — LIPID PANEL
CHOL/HDL RATIO: 3
Cholesterol: 122 mg/dL (ref 0–200)
HDL: 39.4 mg/dL (ref >39.00)
LDL CALC: 69 mg/dL (ref 0–99)
NonHDL: 82.56
TRIGLYCERIDES: 67 mg/dL (ref 0.0–149.0)
VLDL: 13.4 mg/dL (ref 0.0–40.0)

## 2016-10-14 LAB — HEPATIC FUNCTION PANEL
ALBUMIN: 4.5 g/dL (ref 3.5–5.2)
ALK PHOS: 85 U/L (ref 39–117)
ALT: 27 U/L (ref 0–53)
AST: 21 U/L (ref 0–37)
BILIRUBIN DIRECT: 0.2 mg/dL (ref 0.0–0.3)
TOTAL PROTEIN: 6.9 g/dL (ref 6.0–8.3)
Total Bilirubin: 0.8 mg/dL (ref 0.2–1.2)

## 2016-10-16 ENCOUNTER — Encounter: Payer: Self-pay | Admitting: Internal Medicine

## 2016-10-18 ENCOUNTER — Encounter: Payer: Self-pay | Admitting: Internal Medicine

## 2016-10-18 ENCOUNTER — Ambulatory Visit (INDEPENDENT_AMBULATORY_CARE_PROVIDER_SITE_OTHER): Payer: 59 | Admitting: Internal Medicine

## 2016-10-18 VITALS — BP 110/62 | HR 97 | Temp 98.7°F | Resp 12 | Ht 79.0 in | Wt 220.2 lb

## 2016-10-18 DIAGNOSIS — I2510 Atherosclerotic heart disease of native coronary artery without angina pectoris: Secondary | ICD-10-CM

## 2016-10-18 DIAGNOSIS — E7800 Pure hypercholesterolemia, unspecified: Secondary | ICD-10-CM

## 2016-10-18 DIAGNOSIS — Z0000 Encounter for general adult medical examination without abnormal findings: Secondary | ICD-10-CM | POA: Diagnosis not present

## 2016-10-18 DIAGNOSIS — M25511 Pain in right shoulder: Secondary | ICD-10-CM | POA: Diagnosis not present

## 2016-10-18 DIAGNOSIS — I719 Aortic aneurysm of unspecified site, without rupture: Secondary | ICD-10-CM

## 2016-10-18 NOTE — Progress Notes (Signed)
Pre-visit discussion using our clinic review tool. No additional management support is needed unless otherwise documented below in the visit note.  

## 2016-10-18 NOTE — Assessment & Plan Note (Signed)
Physical today 10/18/16.  Colonoscopy 08/11/15.  PSA 1.37 - 10/14/16.

## 2016-10-18 NOTE — Progress Notes (Signed)
Patient ID: Jake Wagner, male   DOB: 02/19/1961, 56 y.o.   MRN: [id number]   Subjective:    Patient ID: Jake Wagner, male    DOB: 27-Jun-1960, 56 y.o.   MRN: [id number]  HPI  Patient here for his physical exam.  States he is doing well.  Is exercising.  Has lost weight.  No chest pain.  No sob.  No acid reflux.  No abdominal pain.  Bowels moving.  Still having some pain in his right shoulder.  Mostly with abduction.  Seeing ortho.  S/p injection last visit.  Helped.  Planning to f/u with ortho.     Past Medical History:  Diagnosis Date  . Anginal pain ([place])   . History of chicken pox   . Hypercholesteremia    Past Surgical History:  Procedure Laterality Date  . CARDIAC CATHETERIZATION Left 01/07/2016   Procedure: Left Heart Cath and Coronary Angiography;  Surgeon: Yolonda Kida, MD;  Location: [facility];  Service: Cardiovascular;  Laterality: Left;   Family History  Problem Relation Age of Onset  . Adopted: Yes   Social History   Social History  . Marital status: Married    Spouse name: N/A  . Number of children: N/A  . Years of education: N/A   Social History Main Topics  . Smoking status: Never Smoker  . Smokeless tobacco: Never Used  . Alcohol use 0.0 oz/week  . Drug use: Unknown  . Sexual activity: Not Asked   Other Topics Concern  . None   Social History Narrative  . None    Outpatient Encounter Prescriptions as of 10/18/2016  Medication Sig  . aspirin 81 MG EC tablet Take 81 mg by mouth daily.  . clopidogrel (PLAVIX) 75 MG tablet Take 75 mg by mouth daily.  . nitroGLYCERIN (NITROSTAT) 0.4 MG SL tablet Place under the tongue.  . pramoxine-hydrocortisone (ANALPRAM-HC) 1-1 % rectal cream Place 1 application rectally 2 (two) times daily.  . rosuvastatin (CRESTOR) 5 MG tablet Take 1 tablet (5 mg total) by mouth daily. (Patient taking differently: Take 20 mg by mouth daily. )   No facility-administered encounter medications on file as of  10/18/2016.     Review of Systems  Constitutional: Negative for appetite change and unexpected weight change.  HENT: Negative for congestion and sinus pressure.   Eyes: Negative for pain and visual disturbance.  Respiratory: Negative for cough, chest tightness and shortness of breath.   Cardiovascular: Negative for chest pain, palpitations and leg swelling.  Gastrointestinal: Negative for abdominal pain, diarrhea, nausea and vomiting.  Genitourinary: Negative for difficulty urinating and dysuria.  Musculoskeletal: Negative for back pain and joint swelling.       Shoulder pain as outlined.    Skin: Negative for color change and rash.  Neurological: Negative for dizziness, light-headedness and headaches.  Hematological: Negative for adenopathy. Does not bruise/bleed easily.  Psychiatric/Behavioral: Negative for agitation and dysphoric mood.       Objective:    Physical Exam  Constitutional: He is oriented to person, place, and time. He appears well-developed and well-nourished. No distress.  HENT:  Head: Normocephalic and atraumatic.  Nose: Nose normal.  Mouth/Throat: Oropharynx is clear and moist. No oropharyngeal exudate.  Eyes: Conjunctivae are normal. Right eye exhibits no discharge. Left eye exhibits no discharge.  Neck: Neck supple. No thyromegaly present.  Cardiovascular: Normal rate and regular rhythm.   Pulmonary/Chest: Breath sounds normal. No respiratory distress. He has no wheezes.  Abdominal: Soft. Bowel sounds  are normal. There is no tenderness.  Genitourinary:  Genitourinary Comments: Rectal:  No palpable prostate nodule.  Heme negative.    Musculoskeletal: He exhibits no edema or tenderness.  Lymphadenopathy:    He has no cervical adenopathy.  Neurological: He is alert and oriented to person, place, and time.  Skin: Skin is warm and dry. No rash noted. No erythema.  Psychiatric: He has a normal mood and affect. His behavior is normal.    BP 110/62 (BP Location:  Left Arm, Patient Position: Sitting, Cuff Size: Normal)   Pulse 97   Temp 98.7 F (37.1 C) (Oral)   Resp 12   Ht 6\' 7"  (2.007 m)   Wt 220 lb 3.2 oz (99.9 kg)   SpO2 98%   BMI 24.81 kg/m  Wt Readings from Last 3 Encounters:  10/18/16 220 lb 3.2 oz (99.9 kg)  05/16/16 225 lb 12.8 oz (102.4 kg)  02/18/16 232 lb 6.4 oz (105.4 kg)     Lab Results  Component Value Date   WBC 5.4 10/14/2015   HGB 14.8 10/14/2015   HCT 43.6 10/14/2015   PLT 217.0 10/14/2015   GLUCOSE 98 10/14/2016   CHOL 122 10/14/2016   TRIG 67.0 10/14/2016   HDL 39.40 10/14/2016   LDLCALC 69 10/14/2016   ALT 27 10/14/2016   AST 21 10/14/2016   NA 138 10/14/2016   K 4.7 10/14/2016   CL 104 10/14/2016   CREATININE 0.87 10/14/2016   BUN 19 10/14/2016   CO2 30 10/14/2016   TSH 2.03 06/16/2015   PSA 1.37 10/14/2016    Dg Shoulder Right  Result Date: 02/18/2016 CLINICAL DATA:  Pain.  Moving injury. EXAM: RIGHT SHOULDER - 2+ VIEW COMPARISON:  No prior. FINDINGS: Glenohumeral degenerative change. No evidence of fracture, dislocation, or separation. Apical pleural thickening noted consistent scarring. Punctate bony density noted in the right humeral head, most likely a bone island. IMPRESSION: Glenohumeral degenerative change.  No acute abnormality. Electronically Signed   By: Marcello Moores  Register   On: 02/18/2016 14:17       Assessment & Plan:   Problem List Items Addressed This Visit    Aortic aneurysm ([place])    Ascending aortic aneurysm.  Being followed by cardiology.  Planning for annual echo.        CAD (coronary artery disease)    S/p stent x 2 LAD.  Currently doing well.  Continue risk factor modification.        Health care maintenance    Physical today 10/18/16.  Colonoscopy 08/11/15.  PSA 1.37 - 10/14/16.        Hypercholesterolemia    On crestor.  Continue low cholesterol diet and exercise.  Follow lipid panel and liver function tests.   Lab Results  Component Value Date   CHOL 122 10/14/2016    HDL 39.40 10/14/2016   LDLCALC 69 10/14/2016   TRIG 67.0 10/14/2016   CHOLHDL 3 10/14/2016        Relevant Orders   Lipid panel   Hepatic function panel   TSH   CBC with Differential/Platelet   Basic metabolic panel   Right shoulder pain    Seeing ortho.  Physical therapy.  S/p injection.         Other Visit Diagnoses    Routine general medical examination at a health care facility    -  Primary       Einar Pheasant, MD

## 2016-10-22 ENCOUNTER — Encounter: Payer: Self-pay | Admitting: Internal Medicine

## 2016-10-22 DIAGNOSIS — I719 Aortic aneurysm of unspecified site, without rupture: Secondary | ICD-10-CM | POA: Insufficient documentation

## 2016-10-22 NOTE — Assessment & Plan Note (Signed)
On crestor.  Continue low cholesterol diet and exercise.  Follow lipid panel and liver function tests.   Lab Results  Component Value Date   CHOL 122 10/14/2016   HDL 39.40 10/14/2016   LDLCALC 69 10/14/2016   TRIG 67.0 10/14/2016   CHOLHDL 3 10/14/2016

## 2016-10-22 NOTE — Assessment & Plan Note (Signed)
Seeing ortho.  Physical therapy.  S/p injection.

## 2016-10-22 NOTE — Assessment & Plan Note (Signed)
Ascending aortic aneurysm.  Being followed by cardiology.  Planning for annual echo.

## 2016-10-22 NOTE — Assessment & Plan Note (Signed)
S/p stent x 2 LAD.  Currently doing well.  Continue risk factor modification.

## 2017-04-28 ENCOUNTER — Other Ambulatory Visit: Payer: 59

## 2017-05-02 ENCOUNTER — Ambulatory Visit: Payer: 59 | Admitting: Internal Medicine

## 2017-05-30 ENCOUNTER — Encounter: Payer: Self-pay | Admitting: Internal Medicine

## 2017-05-30 DIAGNOSIS — Z86006 Personal history of melanoma in-situ: Secondary | ICD-10-CM | POA: Insufficient documentation

## 2017-05-30 DIAGNOSIS — D039 Melanoma in situ, unspecified: Secondary | ICD-10-CM | POA: Insufficient documentation

## 2017-06-15 ENCOUNTER — Other Ambulatory Visit: Payer: 59

## 2017-06-15 DIAGNOSIS — Z85820 Personal history of malignant melanoma of skin: Secondary | ICD-10-CM | POA: Insufficient documentation

## 2017-06-16 ENCOUNTER — Other Ambulatory Visit (INDEPENDENT_AMBULATORY_CARE_PROVIDER_SITE_OTHER): Payer: 59

## 2017-06-16 DIAGNOSIS — E7800 Pure hypercholesterolemia, unspecified: Secondary | ICD-10-CM

## 2017-06-16 LAB — HEPATIC FUNCTION PANEL
ALT: 23 U/L (ref 0–53)
AST: 22 U/L (ref 0–37)
Albumin: 4.4 g/dL (ref 3.5–5.2)
Alkaline Phosphatase: 70 U/L (ref 39–117)
BILIRUBIN TOTAL: 0.9 mg/dL (ref 0.2–1.2)
Bilirubin, Direct: 0.2 mg/dL (ref 0.0–0.3)
TOTAL PROTEIN: 7 g/dL (ref 6.0–8.3)

## 2017-06-16 LAB — LIPID PANEL
CHOLESTEROL: 123 mg/dL (ref 0–200)
HDL: 38.6 mg/dL — AB (ref >39.00)
LDL CALC: 69 mg/dL (ref 0–99)
NonHDL: 84.15
TRIGLYCERIDES: 78 mg/dL (ref 0.0–149.0)
Total CHOL/HDL Ratio: 3
VLDL: 15.6 mg/dL (ref 0.0–40.0)

## 2017-06-16 LAB — CBC WITH DIFFERENTIAL/PLATELET
BASOS ABS: 0 10*3/uL (ref 0.0–0.1)
BASOS PCT: 1 % (ref 0.0–3.0)
EOS ABS: 0.2 10*3/uL (ref 0.0–0.7)
Eosinophils Relative: 3.2 % (ref 0.0–5.0)
HEMATOCRIT: 45 % (ref 39.0–52.0)
Hemoglobin: 15.2 g/dL (ref 13.0–17.0)
LYMPHS ABS: 1.3 10*3/uL (ref 0.7–4.0)
LYMPHS PCT: 28.2 % (ref 12.0–46.0)
MCHC: 33.7 g/dL (ref 30.0–36.0)
MCV: 90.3 fl (ref 78.0–100.0)
Monocytes Absolute: 0.4 10*3/uL (ref 0.1–1.0)
Monocytes Relative: 8.9 % (ref 3.0–12.0)
NEUTROS PCT: 58.7 % (ref 43.0–77.0)
Neutro Abs: 2.8 10*3/uL (ref 1.4–7.7)
PLATELETS: 203 10*3/uL (ref 150.0–400.0)
RBC: 4.98 Mil/uL (ref 4.22–5.81)
RDW: 12.6 % (ref 11.5–15.5)
WBC: 4.7 10*3/uL (ref 4.0–10.5)

## 2017-06-16 LAB — BASIC METABOLIC PANEL
BUN: 17 mg/dL (ref 6–23)
CALCIUM: 9.3 mg/dL (ref 8.4–10.5)
CHLORIDE: 103 meq/L (ref 96–112)
CO2: 29 meq/L (ref 19–32)
CREATININE: 0.83 mg/dL (ref 0.40–1.50)
GFR: 101.28 mL/min (ref >60.00)
Glucose, Bld: 102 mg/dL — ABNORMAL HIGH (ref 70–99)
Potassium: 4.2 mEq/L (ref 3.5–5.1)
Sodium: 138 mEq/L (ref 135–145)

## 2017-06-16 LAB — TSH: TSH: 4.42 u[IU]/mL (ref 0.35–4.50)

## 2017-06-19 ENCOUNTER — Encounter: Payer: Self-pay | Admitting: Internal Medicine

## 2017-06-22 ENCOUNTER — Encounter: Payer: Self-pay | Admitting: Internal Medicine

## 2017-06-22 ENCOUNTER — Ambulatory Visit (INDEPENDENT_AMBULATORY_CARE_PROVIDER_SITE_OTHER): Payer: 59 | Admitting: Internal Medicine

## 2017-06-22 VITALS — BP 108/62 | HR 61 | Temp 98.3°F | Resp 18 | Wt 228.6 lb

## 2017-06-22 DIAGNOSIS — I719 Aortic aneurysm of unspecified site, without rupture: Secondary | ICD-10-CM | POA: Diagnosis not present

## 2017-06-22 DIAGNOSIS — D039 Melanoma in situ, unspecified: Secondary | ICD-10-CM | POA: Diagnosis not present

## 2017-06-22 DIAGNOSIS — I2510 Atherosclerotic heart disease of native coronary artery without angina pectoris: Secondary | ICD-10-CM | POA: Diagnosis not present

## 2017-06-22 DIAGNOSIS — Z23 Encounter for immunization: Secondary | ICD-10-CM

## 2017-06-22 DIAGNOSIS — E7800 Pure hypercholesterolemia, unspecified: Secondary | ICD-10-CM | POA: Diagnosis not present

## 2017-06-22 DIAGNOSIS — Z125 Encounter for screening for malignant neoplasm of prostate: Secondary | ICD-10-CM

## 2017-06-22 MED ORDER — HYDROCORTISONE ACE-PRAMOXINE 2.5-1 % RE CREA: 1.0000 "application " | TOPICAL_CREAM | Freq: Two times a day (BID) | RECTAL | 0 refills | Status: AC | PRN

## 2017-06-22 MED ORDER — ROSUVASTATIN CALCIUM 20 MG PO TABS: ORAL_TABLET | ORAL | 3 refills | Status: DC

## 2017-06-22 NOTE — Progress Notes (Signed)
Patient ID: Jake Wagner, male   DOB: July 15, 1960, 57 y.o.   MRN: [id number]   Subjective:    Patient ID: Jake Wagner, male    DOB: 01-19-1961, 57 y.o.   MRN: [id number]  HPI  Patient here for a scheduled followup.  He is accompanied by his wife.  History obtained from both of them.  States he is doing well.  Sees cardiology.  Had f/u echo recently.  Stable.  Has f/u in one year per pt.  No chest pain.  No sob.  No acid reflux.  No abdominal pain.  Bowels moving.  Some issues with hemorrhoids.  Was not able to get the previous cream.  Wife states 2.5 dose is available.  Recently had removal of skin lesion from his neck.  Doing well.  Continues f/u with dermatology.    Past Medical History:  Diagnosis Date  . Anginal pain ([place])   . History of chicken pox   . Hypercholesteremia    Past Surgical History:  Procedure Laterality Date  . CARDIAC CATHETERIZATION Left 01/07/2016   Procedure: Left Heart Cath and Coronary Angiography;  Surgeon: Yolonda Kida, MD;  Location: [facility];  Service: Cardiovascular;  Laterality: Left;   Family History  Adopted: Yes   Social History   Socioeconomic History  . Marital status: Married    Spouse name: None  . Number of children: None  . Years of education: None  . Highest education level: None  Social Needs  . Financial resource strain: None  . Food insecurity - worry: None  . Food insecurity - inability: None  . Transportation needs - medical: None  . Transportation needs - non-medical: None  Occupational History  . None  Tobacco Use  . Smoking status: Never Smoker  . Smokeless tobacco: Never Used  Substance and Sexual Activity  . Alcohol use: Yes    Alcohol/week: 0.0 oz  . Drug use: None  . Sexual activity: None  Other Topics Concern  . None  Social History Narrative  . None    Outpatient Encounter Medications as of 06/22/2017  Medication Sig  . aspirin 81 MG EC tablet Take 81 mg by mouth daily.  . clopidogrel  (PLAVIX) 75 MG tablet Take 75 mg by mouth daily.  . hydrocortisone-pramoxine (ANALPRAM HC) 2.5-1 % rectal cream Place 1 application rectally 2 (two) times daily as needed for hemorrhoids or anal itching.  . rosuvastatin (CRESTOR) 20 MG tablet TAKE 1 TABLET (20 MG TOTAL) BY MOUTH NIGHTLY.  . [DISCONTINUED] pramoxine-hydrocortisone (ANALPRAM-HC) 1-1 % rectal cream Place 1 application rectally 2 (two) times daily.  . [DISCONTINUED] rosuvastatin (CRESTOR) 20 MG tablet TAKE 1 TABLET (20 MG TOTAL) BY MOUTH NIGHTLY.  . [DISCONTINUED] rosuvastatin (CRESTOR) 5 MG tablet Take 1 tablet (5 mg total) by mouth daily. (Patient taking differently: Take 20 mg by mouth daily. )   No facility-administered encounter medications on file as of 06/22/2017.     Review of Systems  Constitutional: Negative for appetite change and unexpected weight change.  HENT: Negative for congestion and sinus pressure.   Respiratory: Negative for cough, chest tightness and shortness of breath.   Cardiovascular: Negative for chest pain, palpitations and leg swelling.  Gastrointestinal: Negative for abdominal pain, diarrhea and nausea.  Genitourinary: Negative for difficulty urinating and dysuria.  Musculoskeletal: Negative for back pain and joint swelling.  Skin: Negative for color change and rash.  Neurological: Negative for dizziness, light-headedness and headaches.  Psychiatric/Behavioral: Negative for agitation and dysphoric mood.  Objective:    Physical Exam  Constitutional: He appears well-developed and well-nourished. No distress.  HENT:  Nose: Nose normal.  Mouth/Throat: Oropharynx is clear and moist.  Neck: Neck supple. No thyromegaly present.  Cardiovascular: Normal rate and regular rhythm.  Pulmonary/Chest: Effort normal and breath sounds normal. No respiratory distress.  Abdominal: Soft. Bowel sounds are normal. There is no tenderness.  Musculoskeletal: He exhibits no edema or tenderness.    Lymphadenopathy:    He has no cervical adenopathy.  Skin: No rash noted. No erythema.  Psychiatric: He has a normal mood and affect. His behavior is normal.    BP 108/62 (BP Location: Left Arm, Patient Position: Sitting, Cuff Size: Normal)   Pulse 61   Temp 98.3 F (36.8 C) (Oral)   Resp 18   Wt 228 lb 9.6 oz (103.7 kg)   SpO2 98%   BMI 25.75 kg/m  Wt Readings from Last 3 Encounters:  06/22/17 228 lb 9.6 oz (103.7 kg)  10/18/16 220 lb 3.2 oz (99.9 kg)  05/16/16 225 lb 12.8 oz (102.4 kg)     Lab Results  Component Value Date   WBC 4.7 06/16/2017   HGB 15.2 06/16/2017   HCT 45.0 06/16/2017   PLT 203.0 06/16/2017   GLUCOSE 102 (H) 06/16/2017   CHOL 123 06/16/2017   TRIG 78.0 06/16/2017   HDL 38.60 (L) 06/16/2017   LDLCALC 69 06/16/2017   ALT 23 06/16/2017   AST 22 06/16/2017   NA 138 06/16/2017   K 4.2 06/16/2017   CL 103 06/16/2017   CREATININE 0.83 06/16/2017   BUN 17 06/16/2017   CO2 29 06/16/2017   TSH 4.42 06/16/2017   PSA 1.37 10/14/2016    Dg Shoulder Right  Result Date: 02/18/2016 CLINICAL DATA:  Pain.  Moving injury. EXAM: RIGHT SHOULDER - 2+ VIEW COMPARISON:  No prior. FINDINGS: Glenohumeral degenerative change. No evidence of fracture, dislocation, or separation. Apical pleural thickening noted consistent scarring. Punctate bony density noted in the right humeral head, most likely a bone island. IMPRESSION: Glenohumeral degenerative change.  No acute abnormality. Electronically Signed   By: Marcello Moores  Register   On: 02/18/2016 14:17       Assessment & Plan:   Problem List Items Addressed This Visit    Aortic aneurysm ([place])    Ascending aortic aneurysm.  Being followed by cardiology.  Had recent echo.  Stable.  Planing for annual f/u.        Relevant Medications   rosuvastatin (CRESTOR) 20 MG tablet   CAD (coronary artery disease)    S/p stent x 2 LAD.  Followed by cardiology.  Continue risk factor modification.        Relevant Medications    rosuvastatin (CRESTOR) 20 MG tablet   Other Relevant Orders   Basic metabolic panel   Hypercholesterolemia    On crestor.  Low cholesterol diet and exercise.  Follow lipid panel and liver function tests.   Lab Results  Component Value Date   CHOL 123 06/16/2017   HDL 38.60 (L) 06/16/2017   LDLCALC 69 06/16/2017   TRIG 78.0 06/16/2017   CHOLHDL 3 06/16/2017        Relevant Medications   rosuvastatin (CRESTOR) 20 MG tablet   Other Relevant Orders   Hepatic function panel   Lipid panel   Melanoma in situ ([place])    Followed by dermatology.  Recently had skin lesion removed from neck.  Doing well.        Other Visit Diagnoses  Prostate cancer screening    -  Primary   Relevant Orders   PSA   Need for immunization against influenza       Relevant Orders   Flu Vaccine QUAD 36+ mos IM (Completed)       Einar Pheasant, MD

## 2017-06-25 ENCOUNTER — Encounter: Payer: Self-pay | Admitting: Internal Medicine

## 2017-06-25 NOTE — Assessment & Plan Note (Signed)
On crestor.  Low cholesterol diet and exercise.  Follow lipid panel and liver function tests.   Lab Results  Component Value Date   CHOL 123 06/16/2017   HDL 38.60 (L) 06/16/2017   LDLCALC 69 06/16/2017   TRIG 78.0 06/16/2017   CHOLHDL 3 06/16/2017

## 2017-06-25 NOTE — Assessment & Plan Note (Signed)
Ascending aortic aneurysm.  Being followed by cardiology.  Had recent echo.  Stable.  Planing for annual f/u.

## 2017-06-25 NOTE — Assessment & Plan Note (Signed)
S/p stent x 2 LAD.  Followed by cardiology.  Continue risk factor modification.

## 2017-06-25 NOTE — Assessment & Plan Note (Signed)
Followed by dermatology.  Recently had skin lesion removed from neck.  Doing well.

## 2018-01-05 ENCOUNTER — Other Ambulatory Visit (INDEPENDENT_AMBULATORY_CARE_PROVIDER_SITE_OTHER): Payer: 59

## 2018-01-05 DIAGNOSIS — I2510 Atherosclerotic heart disease of native coronary artery without angina pectoris: Secondary | ICD-10-CM

## 2018-01-05 DIAGNOSIS — Z125 Encounter for screening for malignant neoplasm of prostate: Secondary | ICD-10-CM

## 2018-01-05 DIAGNOSIS — E7800 Pure hypercholesterolemia, unspecified: Secondary | ICD-10-CM

## 2018-01-05 LAB — HEPATIC FUNCTION PANEL
ALBUMIN: 4.2 g/dL (ref 3.5–5.2)
ALT: 23 U/L (ref 0–53)
AST: 19 U/L (ref 0–37)
Alkaline Phosphatase: 74 U/L (ref 39–117)
Bilirubin, Direct: 0.2 mg/dL (ref 0.0–0.3)
TOTAL PROTEIN: 6.8 g/dL (ref 6.0–8.3)
Total Bilirubin: 0.8 mg/dL (ref 0.2–1.2)

## 2018-01-05 LAB — BASIC METABOLIC PANEL
BUN: 21 mg/dL (ref 6–23)
CO2: 28 mEq/L (ref 19–32)
Calcium: 9.3 mg/dL (ref 8.4–10.5)
Chloride: 103 mEq/L (ref 96–112)
Creatinine, Ser: 0.99 mg/dL (ref 0.40–1.50)
GFR: 82.48 mL/min (ref >60.00)
Glucose, Bld: 94 mg/dL (ref 70–99)
Potassium: 4.4 mEq/L (ref 3.5–5.1)
Sodium: 138 mEq/L (ref 135–145)

## 2018-01-05 LAB — LIPID PANEL
Cholesterol: 124 mg/dL (ref 0–200)
HDL: 37.8 mg/dL — AB (ref >39.00)
LDL Cholesterol: 70 mg/dL (ref 0–99)
NONHDL: 85.82
Total CHOL/HDL Ratio: 3
Triglycerides: 79 mg/dL (ref 0.0–149.0)
VLDL: 15.8 mg/dL (ref 0.0–40.0)

## 2018-01-05 LAB — PSA: PSA: 1.61 ng/mL (ref 0.10–4.00)

## 2018-01-07 ENCOUNTER — Encounter: Payer: Self-pay | Admitting: Internal Medicine

## 2018-01-09 ENCOUNTER — Ambulatory Visit (INDEPENDENT_AMBULATORY_CARE_PROVIDER_SITE_OTHER): Payer: 59 | Admitting: Internal Medicine

## 2018-01-09 ENCOUNTER — Encounter: Payer: Self-pay | Admitting: Internal Medicine

## 2018-01-09 VITALS — BP 102/64 | HR 65 | Temp 97.9°F | Resp 18 | Ht 79.0 in | Wt 227.2 lb

## 2018-01-09 DIAGNOSIS — I719 Aortic aneurysm of unspecified site, without rupture: Secondary | ICD-10-CM

## 2018-01-09 DIAGNOSIS — R079 Chest pain, unspecified: Secondary | ICD-10-CM | POA: Diagnosis not present

## 2018-01-09 DIAGNOSIS — E7800 Pure hypercholesterolemia, unspecified: Secondary | ICD-10-CM

## 2018-01-09 DIAGNOSIS — R0602 Shortness of breath: Secondary | ICD-10-CM | POA: Diagnosis not present

## 2018-01-09 DIAGNOSIS — D039 Melanoma in situ, unspecified: Secondary | ICD-10-CM

## 2018-01-09 DIAGNOSIS — I2510 Atherosclerotic heart disease of native coronary artery without angina pectoris: Secondary | ICD-10-CM

## 2018-01-09 DIAGNOSIS — Z0000 Encounter for general adult medical examination without abnormal findings: Secondary | ICD-10-CM | POA: Diagnosis not present

## 2018-01-09 DIAGNOSIS — R413 Other amnesia: Secondary | ICD-10-CM | POA: Diagnosis not present

## 2018-01-09 DIAGNOSIS — K649 Unspecified hemorrhoids: Secondary | ICD-10-CM

## 2018-01-09 LAB — CBC WITH DIFFERENTIAL/PLATELET
Basophils Absolute: 0.1 10*3/uL (ref 0.0–0.1)
Basophils Relative: 1.2 % (ref 0.0–3.0)
EOS PCT: 4.6 % (ref 0.0–5.0)
Eosinophils Absolute: 0.2 10*3/uL (ref 0.0–0.7)
HEMATOCRIT: 43.4 % (ref 39.0–52.0)
HEMOGLOBIN: 15.1 g/dL (ref 13.0–17.0)
LYMPHS PCT: 28.7 % (ref 12.0–46.0)
Lymphs Abs: 1.3 10*3/uL (ref 0.7–4.0)
MCHC: 34.7 g/dL (ref 30.0–36.0)
MCV: 88.2 fl (ref 78.0–100.0)
MONO ABS: 0.4 10*3/uL (ref 0.1–1.0)
MONOS PCT: 8.8 % (ref 3.0–12.0)
Neutro Abs: 2.6 10*3/uL (ref 1.4–7.7)
Neutrophils Relative %: 56.7 % (ref 43.0–77.0)
Platelets: 226 10*3/uL (ref 150.0–400.0)
RBC: 4.92 Mil/uL (ref 4.22–5.81)
RDW: 12.9 % (ref 11.5–15.5)
WBC: 4.6 10*3/uL (ref 4.0–10.5)

## 2018-01-09 LAB — TSH: TSH: 2.83 u[IU]/mL (ref 0.35–4.50)

## 2018-01-09 LAB — VITAMIN B12: Vitamin B-12: 429 pg/mL (ref 211–911)

## 2018-01-09 MED ORDER — HYDROCORTISONE 2.5 % RE CREA: 1.0000 "application " | TOPICAL_CREAM | Freq: Two times a day (BID) | RECTAL | 0 refills | Status: DC

## 2018-01-09 NOTE — Progress Notes (Signed)
Patient ID: Jake Wagner, male   DOB: Mar 03, 1961, 57 y.o.   MRN: [id number]   Subjective:    Patient ID: Jake Wagner, male    DOB: Mar 23, 1961, 57 y.o.   MRN: [id number]  HPI  Patient here for his physical exam.  He is accompanied by his wife.  History obtained from both of them. He reports that he is having intermittent issues with hemorrhoids.  No flared now.  Last flared lasted longer - one month.  No pain now. Bowels moving.  Trying to stay active.  No chest pain.  Breathing stable.  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  Was walking from third bas to home plate and fel out of breath.  No pain.  Some sob.  Had to sit down.  Ears felt clogged.  No headache.  States took it easy the rest of the day.  Ears still clogged the next day.  After - felt fine.  No further episodes.  No chest pain.  No sob.  No acid reflux.  No abdominal pain.  He also reports noticing some change with his memory.  Notices that when he walks in a room, he will forget why he came in the room.  No problems with work or finances, etc.  Increased stress.  Discussed with him today.  Does not feel needs any further intervention.      Past Medical History:  Diagnosis Date  . Anginal pain ([place])   . History of chicken pox   . Hypercholesteremia    Past Surgical History:  Procedure Laterality Date  . CARDIAC CATHETERIZATION Left 01/07/2016   Procedure: Left Heart Cath and Coronary Angiography;  Surgeon: Yolonda Kida, MD;  Location: [facility];  Service: Cardiovascular;  Laterality: Left;   Family History  Adopted: Yes   Social History   Socioeconomic History  . Marital status: Married    Spouse name: Not on file  . Number of children: Not on file  . Years of education: Not on file  . Highest education level: Not on file  Occupational History  . Not on file  Social Needs  . Financial resource strain: Not on file  . Food insecurity:    Worry:  Not on file    Inability: Not on file  . Transportation needs:    Medical: Not on file    Non-medical: Not on file  Tobacco Use  . Smoking status: Never Smoker  . Smokeless tobacco: Never Used  Substance and Sexual Activity  . Alcohol use: Yes    Alcohol/week: 0.0 standard drinks  . Drug use: Not on file  . Sexual activity: Not on file  Lifestyle  . Physical activity:    Days per week: Not on file    Minutes per session: Not on file  . Stress: Not on file  Relationships  . Social connections:    Talks on phone: Not on file    Gets together: Not on file    Attends religious service: Not on file    Active member of club or organization: Not on file    Attends meetings of clubs or organizations: Not on file    Relationship status: Not on file  Other Topics Concern  . Not on file  Social History Narrative  . Not on file    Outpatient Encounter Medications as of 01/09/2018  Medication Sig  . aspirin 81 MG EC tablet  Take 81 mg by mouth daily.  . clopidogrel (PLAVIX) 75 MG tablet Take 75 mg by mouth daily.  . hydrocortisone (ANUSOL-HC) 2.5 % rectal cream Place 1 application rectally 2 (two) times daily.  . hydrocortisone-pramoxine (ANALPRAM HC) 2.5-1 % rectal cream Place 1 application rectally 2 (two) times daily as needed for hemorrhoids or anal itching.  . rosuvastatin (CRESTOR) 20 MG tablet TAKE 1 TABLET (20 MG TOTAL) BY MOUTH NIGHTLY.   No facility-administered encounter medications on file as of 01/09/2018.     Review of Systems  Constitutional: Negative for appetite change and unexpected weight change.  HENT: Negative for congestion and sinus pressure.   Eyes: Negative for pain and visual disturbance.  Respiratory: Negative for cough and chest tightness.        Previous episode of sob as outlined.    Cardiovascular: Negative for chest pain, palpitations and leg swelling.  Gastrointestinal: Negative for abdominal pain, diarrhea and nausea.       Hemorrhoid issues as  outlined.  None now.    Genitourinary: Negative for difficulty urinating and dysuria.  Musculoskeletal: Negative for joint swelling and myalgias.  Skin: Negative for color change and rash.  Neurological: Negative for dizziness, light-headedness and headaches.  Hematological: Negative for adenopathy. Does not bruise/bleed easily.  Psychiatric/Behavioral: Negative for agitation and dysphoric mood.       Objective:    Physical Exam  Constitutional: He is oriented to person, place, and time. He appears well-developed and well-nourished. No distress.  HENT:  Head: Normocephalic and atraumatic.  Nose: Nose normal.  Mouth/Throat: Oropharynx is clear and moist. No oropharyngeal exudate.  Eyes: Conjunctivae are normal. Right eye exhibits no discharge. Left eye exhibits no discharge.  Neck: Neck supple. No thyromegaly present.  Cardiovascular: Normal rate and regular rhythm.  Pulmonary/Chest: Breath sounds normal. No respiratory distress. He has no wheezes.  Abdominal: Soft. Bowel sounds are normal. There is no tenderness.  Genitourinary:  Genitourinary Comments: Rectal exam:  No hemorrhoid noted on exam.  Heme negative.    Musculoskeletal: He exhibits no edema or tenderness.  Lymphadenopathy:    He has no cervical adenopathy.  Neurological: He is alert and oriented to person, place, and time.  Skin: No rash noted. No erythema.  Psychiatric: He has a normal mood and affect. His behavior is normal.    BP 102/64 (BP Location: Left Arm, Patient Position: Sitting, Cuff Size: Normal)   Pulse 65   Temp 97.9 F (36.6 C) (Oral)   Resp 18   Ht 6\' 7"  (2.007 m)   Wt 227 lb 3.2 oz (103.1 kg)   SpO2 97%   BMI 25.60 kg/m  Wt Readings from Last 3 Encounters:  01/09/18 227 lb 3.2 oz (103.1 kg)  06/22/17 228 lb 9.6 oz (103.7 kg)  10/18/16 220 lb 3.2 oz (99.9 kg)     Lab Results  Component Value Date   WBC 4.6 01/09/2018   HGB 15.1 01/09/2018   HCT 43.4 01/09/2018   PLT 226.0 01/09/2018    GLUCOSE 94 01/05/2018   CHOL 124 01/05/2018   TRIG 79.0 01/05/2018   HDL 37.80 (L) 01/05/2018   LDLCALC 70 01/05/2018   ALT 23 01/05/2018   AST 19 01/05/2018   NA 138 01/05/2018   K 4.4 01/05/2018   CL 103 01/05/2018   CREATININE 0.99 01/05/2018   BUN 21 01/05/2018   CO2 28 01/05/2018   TSH 2.83 01/09/2018   PSA 1.61 01/05/2018    Dg Shoulder Right  Result Date:  02/18/2016 CLINICAL DATA:  Pain.  Moving injury. EXAM: RIGHT SHOULDER - 2+ VIEW COMPARISON:  No prior. FINDINGS: Glenohumeral degenerative change. No evidence of fracture, dislocation, or separation. Apical pleural thickening noted consistent scarring. Punctate bony density noted in the right humeral head, most likely a bone island. IMPRESSION: Glenohumeral degenerative change.  No acute abnormality. Electronically Signed   By: Marcello Moores  Register   On: 02/18/2016 14:17       Assessment & Plan:   Problem List Items Addressed This Visit    Aortic aneurysm ([place])    Ascending aortic aneurysm.  Followed by cardiology.        CAD (coronary artery disease)    S/p stent x 2 LAD.  Followed by cardiology.  Continue risk factor modification.  Had the episode while working as outlined.  Unclear etiology.  Question if vaso vagal.  Was hot.  Apparently not drinking much.  Resolved and has had no further episodes. EKG - SR/SB with no acute ischemic changes.  Discussed further w/up.  Due f/u with cardiology.  Will call and see if can get earlier appt to confirm no further cardiac w/up warranted.         Relevant Orders   Ambulatory referral to Cardiology   Health care maintenance    Physical today 01/09/18.  Colonoscopy 08/11/15.  PSA 1.61 - 01/05/18.         Hemorrhoid    Problems intermittently with hemorrhoids.  No flare today.  Desires to hold on referral at this time, but states if flares again, desires surgery evaluation.        Hypercholesterolemia    On crestor.  Low cholesterol diet and exercise.  Follow lipid panel and liver  function tests.        Melanoma in situ [facility])    Followed by dermatology.       Memory change    Discussed possible etiologies for memory issues.  Discussed his increased stress.  Discussed could be related to more of a focus or concentration issue.  Will check routine labs.  Follow.        Relevant Orders   CBC with Differential/Platelet (Completed)   TSH (Completed)   Vitamin B12 (Completed)   RPR    Other Visit Diagnoses    Routine general medical examination at a health care facility    -  Primary   SOB (shortness of breath) on exertion       Had the episode as outlined while working.  Question vasovagal.  EKG - SR/SB with no acute ischemic changes.  Plan cardiology w/up as outlined.     Relevant Orders   EKG 12-Lead (Completed)   Ambulatory referral to Cardiology       Einar Pheasant, MD

## 2018-01-10 ENCOUNTER — Encounter: Payer: Self-pay | Admitting: Internal Medicine

## 2018-01-14 ENCOUNTER — Encounter: Payer: Self-pay | Admitting: Internal Medicine

## 2018-01-14 DIAGNOSIS — K649 Unspecified hemorrhoids: Secondary | ICD-10-CM | POA: Insufficient documentation

## 2018-01-14 DIAGNOSIS — R413 Other amnesia: Secondary | ICD-10-CM | POA: Insufficient documentation

## 2018-01-14 NOTE — Assessment & Plan Note (Signed)
Ascending aortic aneurysm.  Followed by cardiology.

## 2018-01-14 NOTE — Assessment & Plan Note (Signed)
Problems intermittently with hemorrhoids.  No flare today.  Desires to hold on referral at this time, but states if flares again, desires surgery evaluation.

## 2018-01-14 NOTE — Assessment & Plan Note (Signed)
Discussed possible etiologies for memory issues.  Discussed his increased stress.  Discussed could be related to more of a focus or concentration issue.  Will check routine labs.  Follow.

## 2018-01-14 NOTE — Assessment & Plan Note (Signed)
Followed by dermatology

## 2018-01-14 NOTE — Assessment & Plan Note (Signed)
Physical today 01/09/18.  Colonoscopy 08/11/15.  PSA 1.61 - 01/05/18.

## 2018-01-14 NOTE — Assessment & Plan Note (Signed)
On crestor.  Low cholesterol diet and exercise.  Follow lipid panel and liver function tests.   

## 2018-01-14 NOTE — Assessment & Plan Note (Addendum)
S/p stent x 2 LAD.  Followed by cardiology.  Continue risk factor modification.  Had the episode while working as outlined.  Unclear etiology.  Question if vaso vagal.  Was hot.  Apparently not drinking much.  Resolved and has had no further episodes. EKG - SR/SB with no acute ischemic changes.  Discussed further w/up.  Due f/u with cardiology.  Will call and see if can get earlier appt to confirm no further cardiac w/up warranted.

## 2018-01-23 LAB — RPR: RPR Ser Ql: NONREACTIVE

## 2018-04-18 ENCOUNTER — Ambulatory Visit (INDEPENDENT_AMBULATORY_CARE_PROVIDER_SITE_OTHER): Payer: 59 | Admitting: Internal Medicine

## 2018-04-18 DIAGNOSIS — D039 Melanoma in situ, unspecified: Secondary | ICD-10-CM | POA: Diagnosis not present

## 2018-04-18 DIAGNOSIS — E7800 Pure hypercholesterolemia, unspecified: Secondary | ICD-10-CM

## 2018-04-18 DIAGNOSIS — I719 Aortic aneurysm of unspecified site, without rupture: Secondary | ICD-10-CM

## 2018-04-18 DIAGNOSIS — I2510 Atherosclerotic heart disease of native coronary artery without angina pectoris: Secondary | ICD-10-CM | POA: Diagnosis not present

## 2018-04-18 NOTE — Progress Notes (Signed)
Subjective:    Patient ID: Jake Wagner, male    DOB: [birth year], 57 y.o.   MRN: [id number]  HPI  Patient here for a scheduled follow up.  He is accompanied by his wife.  States he is doing well. Feels good.  Stays active.  Exercising.  No chest pain.  No sob. No acid reflux.  No abdominal pain.  Bowels moving.  No urine change.  No syncope or near syncopal episodes.     Past Medical History:  Diagnosis Date  . Anginal pain ([place])   . History of chicken pox   . Hypercholesteremia    Past Surgical History:  Procedure Laterality Date  . CARDIAC CATHETERIZATION Left 01/07/2016   Procedure: Left Heart Cath and Coronary Angiography;  Surgeon: Yolonda Kida, MD;  Location: [facility];  Service: Cardiovascular;  Laterality: Left;   Family History  Adopted: Yes   Social History   Socioeconomic History  . Marital status: Married    Spouse name: Not on file  . Number of children: Not on file  . Years of education: Not on file  . Highest education level: Not on file  Occupational History  . Not on file  Social Needs  . Financial resource strain: Not on file  . Food insecurity:    Worry: Not on file    Inability: Not on file  . Transportation needs:    Medical: Not on file    Non-medical: Not on file  Tobacco Use  . Smoking status: Never Smoker  . Smokeless tobacco: Never Used  Substance and Sexual Activity  . Alcohol use: Yes    Alcohol/week: 0.0 standard drinks  . Drug use: Not on file  . Sexual activity: Not on file  Lifestyle  . Physical activity:    Days per week: Not on file    Minutes per session: Not on file  . Stress: Not on file  Relationships  . Social connections:    Talks on phone: Not on file    Gets together: Not on file    Attends religious service: Not on file    Active member of club or organization: Not on file    Attends meetings of clubs or organizations: Not on file    Relationship status: Not on file  Other Topics Concern  .  Not on file  Social History Narrative  . Not on file    Outpatient Encounter Medications as of 04/18/2018  Medication Sig  . aspirin 81 MG EC tablet Take 81 mg by mouth daily.  . clopidogrel (PLAVIX) 75 MG tablet Take 75 mg by mouth daily.  . hydrocortisone (ANUSOL-HC) 2.5 % rectal cream Place 1 application rectally 2 (two) times daily.  . hydrocortisone-pramoxine (ANALPRAM HC) 2.5-1 % rectal cream Place 1 application rectally 2 (two) times daily as needed for hemorrhoids or anal itching.  . nitroGLYCERIN (NITROSTAT) 0.4 MG SL tablet   . rosuvastatin (CRESTOR) 20 MG tablet TAKE 1 TABLET (20 MG TOTAL) BY MOUTH NIGHTLY.   No facility-administered encounter medications on file as of 04/18/2018.     Review of Systems  Constitutional: Negative for appetite change and unexpected weight change.  HENT: Negative for congestion and sinus pressure.   Respiratory: Negative for cough, chest tightness and shortness of breath.   Cardiovascular: Negative for chest pain, palpitations and leg swelling.  Gastrointestinal: Negative for abdominal pain, diarrhea, nausea and vomiting.  Genitourinary: Negative for difficulty urinating and dysuria.  Musculoskeletal: Negative for joint swelling  and myalgias.  Skin: Negative for color change and rash.  Neurological: Negative for dizziness, light-headedness and headaches.  Psychiatric/Behavioral: Negative for agitation and dysphoric mood.       Objective:    Physical Exam  Constitutional: He appears well-developed and well-nourished. No distress.  HENT:  Nose: Nose normal.  Mouth/Throat: Oropharynx is clear and moist.  Neck: Neck supple.  Cardiovascular: Normal rate and regular rhythm.  Pulmonary/Chest: Effort normal and breath sounds normal. No respiratory distress.  Abdominal: Soft. Bowel sounds are normal. There is no tenderness.  Musculoskeletal: He exhibits no edema or tenderness.  Lymphadenopathy:    He has no cervical adenopathy.  Skin: No  rash noted. No erythema.  Psychiatric: He has a normal mood and affect. His behavior is normal.    BP 118/62 (BP Location: Left Arm, Patient Position: Sitting, Cuff Size: Normal)   Pulse (!) 57   Temp 97.6 F (36.4 C) (Oral)   Resp 18   Wt 238 lb 6.4 oz (108.1 kg)   SpO2 98%   BMI 26.86 kg/m  Wt Readings from Last 3 Encounters:  04/18/18 238 lb 6.4 oz (108.1 kg)  01/09/18 227 lb 3.2 oz (103.1 kg)  06/22/17 228 lb 9.6 oz (103.7 kg)     Lab Results  Component Value Date   WBC 4.6 01/09/2018   HGB 15.1 01/09/2018   HCT 43.4 01/09/2018   PLT 226.0 01/09/2018   GLUCOSE 94 01/05/2018   CHOL 124 01/05/2018   TRIG 79.0 01/05/2018   HDL 37.80 (L) 01/05/2018   LDLCALC 70 01/05/2018   ALT 23 01/05/2018   AST 19 01/05/2018   NA 138 01/05/2018   K 4.4 01/05/2018   CL 103 01/05/2018   CREATININE 0.99 01/05/2018   BUN 21 01/05/2018   CO2 28 01/05/2018   TSH 2.83 01/09/2018   PSA 1.61 01/05/2018    Dg Shoulder Right  Result Date: 02/18/2016 CLINICAL DATA:  Pain.  Moving injury. EXAM: RIGHT SHOULDER - 2+ VIEW COMPARISON:  No prior. FINDINGS: Glenohumeral degenerative change. No evidence of fracture, dislocation, or separation. Apical pleural thickening noted consistent scarring. Punctate bony density noted in the right humeral head, most likely a bone island. IMPRESSION: Glenohumeral degenerative change.  No acute abnormality. Electronically Signed   By: Marcello Moores  Register   On: 02/18/2016 14:17       Assessment & Plan:   Problem List Items Addressed This Visit    Aortic aneurysm [facility])    Has been seeing cardiology.       Relevant Medications   nitroGLYCERIN (NITROSTAT) 0.4 MG SL tablet   CAD (coronary artery disease)    S/p stent x 2 LAD.  Followed by cardiology.  Continue risk factor modification.  No chest pain.  No sob.        Relevant Medications   nitroGLYCERIN (NITROSTAT) 0.4 MG SL tablet   Hypercholesterolemia    On crestor.  Low cholesterol diet and exercise.   Follow lipid panel and liver function tests.        Relevant Medications   nitroGLYCERIN (NITROSTAT) 0.4 MG SL tablet   Other Relevant Orders   Lipid panel   Hepatic function panel   Basic metabolic panel   Melanoma in situ [facility])    Followed by dermatology.           Einar Pheasant, MD

## 2018-04-21 ENCOUNTER — Encounter: Payer: Self-pay | Admitting: Internal Medicine

## 2018-04-21 NOTE — Assessment & Plan Note (Signed)
On crestor.  Low cholesterol diet and exercise.  Follow lipid panel and liver function tests.   

## 2018-04-21 NOTE — Assessment & Plan Note (Signed)
Has been seeing cardiology.

## 2018-04-21 NOTE — Assessment & Plan Note (Signed)
Followed by dermatology

## 2018-04-21 NOTE — Assessment & Plan Note (Signed)
S/p stent x 2 LAD.  Followed by cardiology.  Continue risk factor modification.  No chest pain.  No sob.

## 2018-05-07 ENCOUNTER — Other Ambulatory Visit (INDEPENDENT_AMBULATORY_CARE_PROVIDER_SITE_OTHER): Payer: 59

## 2018-05-07 DIAGNOSIS — E7800 Pure hypercholesterolemia, unspecified: Secondary | ICD-10-CM | POA: Diagnosis not present

## 2018-05-07 LAB — BASIC METABOLIC PANEL
BUN: 19 mg/dL (ref 6–23)
CO2: 29 meq/L (ref 19–32)
CREATININE: 0.92 mg/dL (ref 0.40–1.50)
Calcium: 9.4 mg/dL (ref 8.4–10.5)
Chloride: 104 mEq/L (ref 96–112)
GFR: 89.66 mL/min (ref >60.00)
GLUCOSE: 110 mg/dL — AB (ref 70–99)
Potassium: 4.2 mEq/L (ref 3.5–5.1)
Sodium: 139 mEq/L (ref 135–145)

## 2018-05-07 LAB — HEPATIC FUNCTION PANEL
ALBUMIN: 4.4 g/dL (ref 3.5–5.2)
ALK PHOS: 69 U/L (ref 39–117)
ALT: 21 U/L (ref 0–53)
AST: 19 U/L (ref 0–37)
Bilirubin, Direct: 0.1 mg/dL (ref 0.0–0.3)
TOTAL PROTEIN: 7.2 g/dL (ref 6.0–8.3)
Total Bilirubin: 0.7 mg/dL (ref 0.2–1.2)

## 2018-05-07 LAB — LIPID PANEL
Cholesterol: 124 mg/dL (ref 0–200)
HDL: 38.3 mg/dL — ABNORMAL LOW (ref >39.00)
LDL Cholesterol: 72 mg/dL (ref 0–99)
NonHDL: 85.25
Total CHOL/HDL Ratio: 3
Triglycerides: 64 mg/dL (ref 0.0–149.0)
VLDL: 12.8 mg/dL (ref 0.0–40.0)

## 2018-06-05 ENCOUNTER — Other Ambulatory Visit: Payer: Self-pay | Admitting: Internal Medicine

## 2018-06-05 ENCOUNTER — Telehealth: Payer: Self-pay | Admitting: Radiology

## 2018-06-05 DIAGNOSIS — R739 Hyperglycemia, unspecified: Secondary | ICD-10-CM

## 2018-06-05 NOTE — Telephone Encounter (Signed)
Pt coming in for labs tomorrow, please place future orders. Thank you.  

## 2018-06-05 NOTE — Progress Notes (Signed)
Orders placed for f/u labs.  

## 2018-06-05 NOTE — Telephone Encounter (Signed)
Orders placed for f/u labs.  

## 2018-06-06 ENCOUNTER — Encounter: Payer: Self-pay | Admitting: Internal Medicine

## 2018-06-06 ENCOUNTER — Other Ambulatory Visit (INDEPENDENT_AMBULATORY_CARE_PROVIDER_SITE_OTHER): Payer: 59

## 2018-06-06 DIAGNOSIS — R739 Hyperglycemia, unspecified: Secondary | ICD-10-CM

## 2018-06-06 LAB — HEMOGLOBIN A1C: Hgb A1c MFr Bld: 5.5 % (ref 4.6–6.5)

## 2018-06-07 LAB — GLUCOSE, FASTING: Glucose, Plasma: 92 mg/dL (ref 65–99)

## 2018-10-17 ENCOUNTER — Encounter: Payer: Self-pay | Admitting: Internal Medicine

## 2018-10-17 ENCOUNTER — Ambulatory Visit (INDEPENDENT_AMBULATORY_CARE_PROVIDER_SITE_OTHER): Payer: 59 | Admitting: Internal Medicine

## 2018-10-17 ENCOUNTER — Other Ambulatory Visit: Payer: Self-pay

## 2018-10-17 DIAGNOSIS — I2510 Atherosclerotic heart disease of native coronary artery without angina pectoris: Secondary | ICD-10-CM | POA: Diagnosis not present

## 2018-10-17 DIAGNOSIS — E7800 Pure hypercholesterolemia, unspecified: Secondary | ICD-10-CM | POA: Diagnosis not present

## 2018-10-17 DIAGNOSIS — I719 Aortic aneurysm of unspecified site, without rupture: Secondary | ICD-10-CM

## 2018-10-17 DIAGNOSIS — D039 Melanoma in situ, unspecified: Secondary | ICD-10-CM

## 2018-10-17 DIAGNOSIS — F439 Reaction to severe stress, unspecified: Secondary | ICD-10-CM

## 2018-10-17 DIAGNOSIS — R21 Rash and other nonspecific skin eruption: Secondary | ICD-10-CM

## 2018-10-17 DIAGNOSIS — R361 Hematospermia: Secondary | ICD-10-CM

## 2018-10-17 NOTE — Progress Notes (Addendum)
Patient ID: Jake Wagner, male   DOB: [birth year], 59 y.o.   MRN: [id number]   Virtual Visit via video Note  This visit type was conducted due to national recommendations for restrictions regarding the COVID-19 pandemic (e.g. social distancing).  This format is felt to be most appropriate for this patient at this time.  All issues noted in this document were discussed and addressed.  No physical exam was performed (except for noted visual exam findings with Video Visits).   I connected with Jake Wagner by a video enabled telemedicine application or telephone and verified that I am speaking with the correct person using two identifiers. Location patient: home Location provider: work  Persons participating in the virtual visit: patient, provider and his wife Jake Wagner - through part of the visit.    I discussed the limitations, risks, security and privacy concerns of performing an evaluation and management service by video and the availability of in person appointments. I The patient expressed understanding and agreed to proceed.   Reason for visit: scheduled follow up.   HPI: He reports increased stress in dealing with his wife's family's health issues.  This situation has kept the patient and his wife from spending as much time together.  Discussed with him today. He does not feel he needs any further intervention at this time.  Is exercising regularly.  No chest pain.  No sob.  No acid reflux.  No abdominal pain.  Bowels moving.  Sees cardiology for f/u CAD and aortic aneurysm.  Is s/p LAD PCI.  Has f/u planned with cardiology and f/u ECHO - 03/2019.  Taking crestor.  Tolerating.  No chest pain.  No sob.  No acid reflux.  No abdominal pain.  Bowels moving.  Not working now due to [place] restrictions.  No fever.  No cough or congestion.  Also concerned regarding possible ringworm - on his foot.  Resolved now.  Noticed blood in his semen.  He feels he is related to increased bike riding.   Intermittent.  Has not noticed recently.  No dysuria.  Urine change.    ROS: See pertinent positives and negatives per HPI.  Past Medical History:  Diagnosis Date   Anginal pain ([place])    History of chicken pox    Hypercholesteremia     Past Surgical History:  Procedure Laterality Date   CARDIAC CATHETERIZATION Left 01/07/2016   Procedure: Left Heart Cath and Coronary Angiography;  Surgeon: Yolonda Kida, MD;  Location: [facility];  Service: Cardiovascular;  Laterality: Left;    Family History  Adopted: Yes    SOCIAL HX: reviewed.    Current Outpatient Medications:    aspirin 81 MG EC tablet, Take 81 mg by mouth daily., Disp: , Rfl:    clopidogrel (PLAVIX) 75 MG tablet, Take 75 mg by mouth daily., Disp: , Rfl:    hydrocortisone (ANUSOL-HC) 2.5 % rectal cream, Place 1 application rectally 2 (two) times daily., Disp: 30 g, Rfl: 0   hydrocortisone-pramoxine (ANALPRAM HC) 2.5-1 % rectal cream, Place 1 application rectally 2 (two) times daily as needed for hemorrhoids or anal itching., Disp: 30 g, Rfl: 0   nitroGLYCERIN (NITROSTAT) 0.4 MG SL tablet, , Disp: , Rfl:    rosuvastatin (CRESTOR) 20 MG tablet, TAKE 1 TABLET (20 MG TOTAL) BY MOUTH NIGHTLY., Disp: 90 tablet, Rfl: 3  EXAM:  GENERAL: alert, oriented, appears well and in no acute distress  HEENT: atraumatic, conjunttiva clear, no obvious abnormalities on inspection of external nose  and ears  NECK: normal movements of the head and neck  LUNGS: on inspection no signs of respiratory distress, breathing rate appears normal, no obvious gross SOB, gasping or wheezing  CV: no obvious cyanosis  PSYCH/NEURO: pleasant and cooperative, no obvious depression or anxiety, speech and thought processing grossly intact  ASSESSMENT AND PLAN:  Discussed the following assessment and plan:  Aortic aneurysm without rupture, unspecified portion of aorta (HCC)  Coronary artery disease involving native coronary  artery of native heart without angina pectoris  Hypercholesterolemia - Plan: Hepatic function panel, Lipid panel, Basic metabolic panel  Melanoma in situ, unspecified site [facility])  Stress  Rash  Blood in semen - Plan: Urinalysis, Routine w reflex microscopic  Aortic aneurysm ([place]) Followed by cardiology.  Has done well. Has f/u echo planned 03/2019.    CAD (coronary artery disease) S/p stent x 2 LAD.  Followed by cardiology.  Continue risk factor modification.  No chest pain.  Exercising.    Hypercholesterolemia On crestor.  Low cholesterol diet and exercise.  Follow lipid panel and liver function tests.    Melanoma in situ [facility]) Followed by dermatology.    Stress Increased stress as outlined.  Discussed with him today.  Hold on any further intervention.  Follow.    Rash He is concerned over the possibility of ringworm.  Resolve now.  Discussed keep feet dry.  Changing socks.  Lotrimin as needed. Follow.    Blood in semen Noticed blood in semen.  No symptoms.  No dysuria.  No discharge.  He feels is related to increased bike riding.  Check urine.      I discussed the assessment and treatment plan with the patient. The patient was provided an opportunity to ask questions and all were answered. The patient agreed with the plan and demonstrated an understanding of the instructions.   The patient was advised to call back or seek an in-person evaluation if the symptoms worsen or if the condition fails to improve as anticipated.    Einar Pheasant, MD

## 2018-10-21 ENCOUNTER — Encounter: Payer: Self-pay | Admitting: Internal Medicine

## 2018-10-21 DIAGNOSIS — F439 Reaction to severe stress, unspecified: Secondary | ICD-10-CM | POA: Insufficient documentation

## 2018-10-21 DIAGNOSIS — R361 Hematospermia: Secondary | ICD-10-CM | POA: Insufficient documentation

## 2018-10-21 DIAGNOSIS — R21 Rash and other nonspecific skin eruption: Secondary | ICD-10-CM | POA: Insufficient documentation

## 2018-10-21 NOTE — Assessment & Plan Note (Signed)
Followed by dermatology

## 2018-10-21 NOTE — Addendum Note (Signed)
Addended by: Alisa Graff on: 10/21/2018 11:39 AM   Modules accepted: Orders, Level of Service

## 2018-10-21 NOTE — Assessment & Plan Note (Signed)
Noticed blood in semen.  No symptoms.  No dysuria.  No discharge.  He feels is related to increased bike riding.  Check urine.

## 2018-10-21 NOTE — Assessment & Plan Note (Signed)
S/p stent x 2 LAD.  Followed by cardiology.  Continue risk factor modification.  No chest pain.  Exercising.

## 2018-10-21 NOTE — Assessment & Plan Note (Signed)
On crestor.  Low cholesterol diet and exercise.  Follow lipid panel and liver function tests.   

## 2018-10-21 NOTE — Assessment & Plan Note (Signed)
Followed by cardiology.  Has done well. Has f/u echo planned 03/2019.

## 2018-10-21 NOTE — Assessment & Plan Note (Signed)
Increased stress as outlined.  Discussed with him today.  Hold on any further intervention.  Follow.

## 2018-10-21 NOTE — Assessment & Plan Note (Signed)
He is concerned over the possibility of ringworm.  Resolve now.  Discussed keep feet dry.  Changing socks.  Lotrimin as needed. Follow.

## 2019-03-21 DIAGNOSIS — I77810 Thoracic aortic ectasia: Secondary | ICD-10-CM | POA: Diagnosis not present

## 2019-03-21 DIAGNOSIS — E782 Mixed hyperlipidemia: Secondary | ICD-10-CM | POA: Diagnosis not present

## 2019-03-21 DIAGNOSIS — I2510 Atherosclerotic heart disease of native coronary artery without angina pectoris: Secondary | ICD-10-CM | POA: Diagnosis not present

## 2019-08-01 DIAGNOSIS — Z85820 Personal history of malignant melanoma of skin: Secondary | ICD-10-CM | POA: Diagnosis not present

## 2019-08-01 DIAGNOSIS — D1801 Hemangioma of skin and subcutaneous tissue: Secondary | ICD-10-CM | POA: Diagnosis not present

## 2019-08-01 DIAGNOSIS — D225 Melanocytic nevi of trunk: Secondary | ICD-10-CM | POA: Diagnosis not present

## 2019-08-01 DIAGNOSIS — L812 Freckles: Secondary | ICD-10-CM | POA: Diagnosis not present

## 2019-08-01 DIAGNOSIS — L821 Other seborrheic keratosis: Secondary | ICD-10-CM | POA: Diagnosis not present

## 2019-08-01 DIAGNOSIS — L718 Other rosacea: Secondary | ICD-10-CM | POA: Diagnosis not present

## 2019-08-01 DIAGNOSIS — D485 Neoplasm of uncertain behavior of skin: Secondary | ICD-10-CM | POA: Diagnosis not present

## 2019-08-01 DIAGNOSIS — Z85828 Personal history of other malignant neoplasm of skin: Secondary | ICD-10-CM | POA: Diagnosis not present

## 2019-08-01 DIAGNOSIS — Z1283 Encounter for screening for malignant neoplasm of skin: Secondary | ICD-10-CM | POA: Diagnosis not present

## 2019-08-01 DIAGNOSIS — L700 Acne vulgaris: Secondary | ICD-10-CM | POA: Diagnosis not present

## 2019-08-22 DIAGNOSIS — C4441 Basal cell carcinoma of skin of scalp and neck: Secondary | ICD-10-CM | POA: Diagnosis not present

## 2019-12-20 DIAGNOSIS — N401 Enlarged prostate with lower urinary tract symptoms: Secondary | ICD-10-CM | POA: Diagnosis not present

## 2019-12-20 DIAGNOSIS — N138 Other obstructive and reflux uropathy: Secondary | ICD-10-CM | POA: Diagnosis not present

## 2019-12-20 DIAGNOSIS — R361 Hematospermia: Secondary | ICD-10-CM | POA: Diagnosis not present

## 2020-01-02 DIAGNOSIS — N138 Other obstructive and reflux uropathy: Secondary | ICD-10-CM | POA: Diagnosis not present

## 2020-01-02 DIAGNOSIS — N429 Disorder of prostate, unspecified: Secondary | ICD-10-CM | POA: Diagnosis not present

## 2020-01-02 DIAGNOSIS — N401 Enlarged prostate with lower urinary tract symptoms: Secondary | ICD-10-CM | POA: Diagnosis not present

## 2020-01-30 DIAGNOSIS — D225 Melanocytic nevi of trunk: Secondary | ICD-10-CM | POA: Diagnosis not present

## 2020-01-30 DIAGNOSIS — L812 Freckles: Secondary | ICD-10-CM | POA: Diagnosis not present

## 2020-01-30 DIAGNOSIS — D1801 Hemangioma of skin and subcutaneous tissue: Secondary | ICD-10-CM | POA: Diagnosis not present

## 2020-01-30 DIAGNOSIS — Z85828 Personal history of other malignant neoplasm of skin: Secondary | ICD-10-CM | POA: Diagnosis not present

## 2020-01-30 DIAGNOSIS — L718 Other rosacea: Secondary | ICD-10-CM | POA: Diagnosis not present

## 2020-01-30 DIAGNOSIS — Z1283 Encounter for screening for malignant neoplasm of skin: Secondary | ICD-10-CM | POA: Diagnosis not present

## 2020-01-30 DIAGNOSIS — L821 Other seborrheic keratosis: Secondary | ICD-10-CM | POA: Diagnosis not present

## 2020-01-30 DIAGNOSIS — D485 Neoplasm of uncertain behavior of skin: Secondary | ICD-10-CM | POA: Diagnosis not present

## 2020-01-30 DIAGNOSIS — Z85820 Personal history of malignant melanoma of skin: Secondary | ICD-10-CM | POA: Diagnosis not present

## 2020-03-12 ENCOUNTER — Ambulatory Visit (INDEPENDENT_AMBULATORY_CARE_PROVIDER_SITE_OTHER): Payer: 59 | Admitting: Internal Medicine

## 2020-03-12 ENCOUNTER — Ambulatory Visit (INDEPENDENT_AMBULATORY_CARE_PROVIDER_SITE_OTHER): Payer: 59

## 2020-03-12 ENCOUNTER — Other Ambulatory Visit: Payer: Self-pay

## 2020-03-12 VITALS — BP 108/70 | HR 74 | Temp 98.1°F | Resp 16 | Ht 79.0 in | Wt 249.0 lb

## 2020-03-12 DIAGNOSIS — M25571 Pain in right ankle and joints of right foot: Secondary | ICD-10-CM | POA: Insufficient documentation

## 2020-03-12 DIAGNOSIS — I2510 Atherosclerotic heart disease of native coronary artery without angina pectoris: Secondary | ICD-10-CM

## 2020-03-12 DIAGNOSIS — Z23 Encounter for immunization: Secondary | ICD-10-CM | POA: Diagnosis not present

## 2020-03-12 DIAGNOSIS — F439 Reaction to severe stress, unspecified: Secondary | ICD-10-CM

## 2020-03-12 DIAGNOSIS — R69 Illness, unspecified: Secondary | ICD-10-CM | POA: Diagnosis not present

## 2020-03-12 DIAGNOSIS — E7800 Pure hypercholesterolemia, unspecified: Secondary | ICD-10-CM | POA: Diagnosis not present

## 2020-03-12 DIAGNOSIS — I719 Aortic aneurysm of unspecified site, without rupture: Secondary | ICD-10-CM

## 2020-03-12 DIAGNOSIS — D039 Melanoma in situ, unspecified: Secondary | ICD-10-CM | POA: Diagnosis not present

## 2020-03-12 DIAGNOSIS — R361 Hematospermia: Secondary | ICD-10-CM | POA: Diagnosis not present

## 2020-03-12 NOTE — Progress Notes (Signed)
Patient ID: Jake Wagner, male   DOB: [birth year], 59 y.o.   MRN: [id number]   Subjective:    Patient ID: Jake Wagner, male    DOB: 06/18/1960, 59 y.o.   MRN: [id number]  HPI This visit occurred during the SARS-CoV-2 public health emergency.  Safety protocols were in place, including screening questions prior to the visit, additional usage of staff PPE, and extensive cleaning of exam room while observing appropriate contact time as indicated for disinfecting solutions.  Patient here for for a scheduled follow up.  He is accompanied by his wife.  History obtained from both of them.  Trying to stay active.  Working.  No chest pain or sob with increased activity.  Right foot/ankle pain.  First noticed in 09/2019 after walking a long distance.  Persistent pain.  Eating.  No nausea or vomiting.  Bowels moving.  Persistent blood in his sperm.  Seeing urology.  W/up reviewed.  S/p MRI - prostatitis.  Discussed continued f/u with urology.    Past Medical History:  Diagnosis Date  . Anginal pain ([place])   . History of chicken pox   . Hypercholesteremia    Past Surgical History:  Procedure Laterality Date  . CARDIAC CATHETERIZATION Left 01/07/2016   Procedure: Left Heart Cath and Coronary Angiography;  Surgeon: Yolonda Kida, MD;  Location: [facility];  Service: Cardiovascular;  Laterality: Left;   Family History  Adopted: Yes   Social History   Socioeconomic History  . Marital status: Married    Spouse name: Not on file  . Number of children: Not on file  . Years of education: Not on file  . Highest education level: Not on file  Occupational History  . Not on file  Tobacco Use  . Smoking status: Never Smoker  . Smokeless tobacco: Never Used  Substance and Sexual Activity  . Alcohol use: Yes    Alcohol/week: 0.0 standard drinks  . Drug use: Not on file  . Sexual activity: Not on file  Other Topics Concern  . Not on file  Social History Narrative  . Not on file    Social Determinants of Health   Financial Resource Strain:   . Difficulty of Paying Living Expenses: Not on file  Food Insecurity:   . Worried About Charity fundraiser in the Last Year: Not on file  . Ran Out of Food in the Last Year: Not on file  Transportation Needs:   . Lack of Transportation (Medical): Not on file  . Lack of Transportation (Non-Medical): Not on file  Physical Activity:   . Days of Exercise per Week: Not on file  . Minutes of Exercise per Session: Not on file  Stress:   . Feeling of Stress : Not on file  Social Connections:   . Frequency of Communication with Friends and Family: Not on file  . Frequency of Social Gatherings with Friends and Family: Not on file  . Attends Religious Services: Not on file  . Active Member of Clubs or Organizations: Not on file  . Attends Archivist Meetings: Not on file  . Marital Status: Not on file    Outpatient Encounter Medications as of 03/12/2020  Medication Sig  . aspirin 81 MG EC tablet Take 81 mg by mouth daily.  . clopidogrel (PLAVIX) 75 MG tablet Take 75 mg by mouth daily.  . hydrocortisone (ANUSOL-HC) 2.5 % rectal cream Place 1 application rectally 2 (two) times daily.  . hydrocortisone-pramoxine (ANALPRAM HC)  2.5-1 % rectal cream Place 1 application rectally 2 (two) times daily as needed for hemorrhoids or anal itching.  . nitroGLYCERIN (NITROSTAT) 0.4 MG SL tablet   . rosuvastatin (CRESTOR) 20 MG tablet TAKE 1 TABLET (20 MG TOTAL) BY MOUTH NIGHTLY.   No facility-administered encounter medications on file as of 03/12/2020.    Review of Systems  Constitutional: Negative for appetite change and unexpected weight change.  HENT: Negative for congestion and sinus pressure.   Respiratory: Negative for cough, chest tightness and shortness of breath.   Cardiovascular: Negative for chest pain, palpitations and leg swelling.  Gastrointestinal: Negative for abdominal pain, diarrhea, nausea and vomiting.   Genitourinary: Negative for difficulty urinating and dysuria.  Musculoskeletal: Negative for joint swelling and myalgias.  Skin: Negative for color change and rash.  Neurological: Negative for dizziness, light-headedness and headaches.  Psychiatric/Behavioral: Negative for agitation and dysphoric mood.       Objective:    Physical Exam Vitals reviewed.  Constitutional:      General: He is not in acute distress.    Appearance: Normal appearance. He is well-developed.  HENT:     Head: Normocephalic and atraumatic.     Right Ear: External ear normal.     Left Ear: External ear normal.  Eyes:     General: No scleral icterus.       Right eye: No discharge.        Left eye: No discharge.     Conjunctiva/sclera: Conjunctivae normal.  Cardiovascular:     Rate and Rhythm: Normal rate and regular rhythm.  Pulmonary:     Effort: Pulmonary effort is normal. No respiratory distress.     Breath sounds: Normal breath sounds.  Abdominal:     General: Bowel sounds are normal.     Palpations: Abdomen is soft.     Tenderness: There is no abdominal tenderness.  Musculoskeletal:        General: No swelling or tenderness.     Cervical back: Neck supple. No tenderness.  Lymphadenopathy:     Cervical: No cervical adenopathy.  Skin:    Findings: No erythema or rash.  Neurological:     Mental Status: He is alert.  Psychiatric:        Mood and Affect: Mood normal.        Behavior: Behavior normal.     BP 108/70   Pulse 74   Temp 98.1 F (36.7 C) (Oral)   Resp 16   Ht 6\' 7"  (2.007 m)   Wt 249 lb (112.9 kg)   SpO2 98%   BMI 28.05 kg/m  Wt Readings from Last 3 Encounters:  03/12/20 249 lb (112.9 kg)  04/18/18 238 lb 6.4 oz (108.1 kg)  01/09/18 227 lb 3.2 oz (103.1 kg)     Lab Results  Component Value Date   WBC 5.0 03/12/2020   HGB 15.0 03/12/2020   HCT 43.4 03/12/2020   PLT 212.0 03/12/2020   GLUCOSE 84 03/12/2020   CHOL 141 03/12/2020   TRIG 88.0 03/12/2020   HDL 38.30  (L) 03/12/2020   LDLCALC 85 03/12/2020   ALT 36 03/12/2020   AST 23 03/12/2020   NA 138 03/12/2020   K 4.4 03/12/2020   CL 102 03/12/2020   CREATININE 0.94 03/12/2020   BUN 19 03/12/2020   CO2 29 03/12/2020   TSH 4.14 03/12/2020   PSA 1.80 03/12/2020   HGBA1C 5.5 06/06/2018    DG Shoulder Right  Result Date: 02/18/2016 CLINICAL DATA:  Pain.  Moving injury. EXAM: RIGHT SHOULDER - 2+ VIEW COMPARISON:  No prior. FINDINGS: Glenohumeral degenerative change. No evidence of fracture, dislocation, or separation. Apical pleural thickening noted consistent scarring. Punctate bony density noted in the right humeral head, most likely a bone island. IMPRESSION: Glenohumeral degenerative change.  No acute abnormality. Electronically Signed   By: Marcello Moores  Register   On: 02/18/2016 14:17       Assessment & Plan:   Problem List Items Addressed This Visit    Stress    Overall appears to be handling things well.  Follow.       Right ankle pain    Persistent pain.  Check xray. May need podiatry evaluation.       Relevant Orders   DG Ankle 2 Views Right (Completed)   Melanoma in situ ([place])    Followed by dermatology.       Hypercholesterolemia - Primary    On crestor.  Overdue labs.  Check lipid panel and liver function today.       Relevant Orders   CBC with Differential/Platelet (Completed)   Hepatic function panel (Completed)   Lipid panel (Completed)   TSH (Completed)   Basic metabolic panel (Completed)   CAD (coronary artery disease)    S/p stent LAD x 2.  Continue f/u with cardiology.  Continue risk factor modification.       Blood in semen    Persistent.  Being worked up by urology.  Discussed the need for f/u given persistent.       Relevant Orders   PSA (Completed)   Aortic aneurysm [facility])    Has f/u planned with cardiology next week.        Other Visit Diagnoses    Need for immunization against influenza       Relevant Orders   Flu Vaccine QUAD 36+ mos IM  (Completed)       Einar Pheasant, MD

## 2020-03-13 LAB — CBC WITH DIFFERENTIAL/PLATELET
Basophils Absolute: 0 10*3/uL (ref 0.0–0.1)
Basophils Relative: 0.6 % (ref 0.0–3.0)
Eosinophils Absolute: 0.2 10*3/uL (ref 0.0–0.7)
Eosinophils Relative: 3.4 % (ref 0.0–5.0)
HCT: 43.4 % (ref 39.0–52.0)
Hemoglobin: 15 g/dL (ref 13.0–17.0)
Lymphocytes Relative: 34.2 % (ref 12.0–46.0)
Lymphs Abs: 1.7 10*3/uL (ref 0.7–4.0)
MCHC: 34.5 g/dL (ref 30.0–36.0)
MCV: 89 fl (ref 78.0–100.0)
Monocytes Absolute: 0.5 10*3/uL (ref 0.1–1.0)
Monocytes Relative: 9.8 % (ref 3.0–12.0)
Neutro Abs: 2.6 10*3/uL (ref 1.4–7.7)
Neutrophils Relative %: 52 % (ref 43.0–77.0)
Platelets: 212 10*3/uL (ref 150.0–400.0)
RBC: 4.87 Mil/uL (ref 4.22–5.81)
RDW: 13 % (ref 11.5–15.5)
WBC: 5 10*3/uL (ref 4.0–10.5)

## 2020-03-13 LAB — BASIC METABOLIC PANEL
BUN: 19 mg/dL (ref 6–23)
CO2: 29 mEq/L (ref 19–32)
Calcium: 9.6 mg/dL (ref 8.4–10.5)
Chloride: 102 mEq/L (ref 96–112)
Creatinine, Ser: 0.94 mg/dL (ref 0.40–1.50)
GFR: 87.56 mL/min (ref >60.00)
Glucose, Bld: 84 mg/dL (ref 70–99)
Potassium: 4.4 mEq/L (ref 3.5–5.1)
Sodium: 138 mEq/L (ref 135–145)

## 2020-03-13 LAB — LIPID PANEL
Cholesterol: 141 mg/dL (ref 0–200)
HDL: 38.3 mg/dL — ABNORMAL LOW (ref >39.00)
LDL Cholesterol: 85 mg/dL (ref 0–99)
NonHDL: 102.54
Total CHOL/HDL Ratio: 4
Triglycerides: 88 mg/dL (ref 0.0–149.0)
VLDL: 17.6 mg/dL (ref 0.0–40.0)

## 2020-03-13 LAB — HEPATIC FUNCTION PANEL
ALT: 36 U/L (ref 0–53)
AST: 23 U/L (ref 0–37)
Albumin: 4.5 g/dL (ref 3.5–5.2)
Alkaline Phosphatase: 81 U/L (ref 39–117)
Bilirubin, Direct: 0.1 mg/dL (ref 0.0–0.3)
Total Bilirubin: 0.8 mg/dL (ref 0.2–1.2)
Total Protein: 7.3 g/dL (ref 6.0–8.3)

## 2020-03-13 LAB — PSA: PSA: 1.8 ng/mL (ref 0.10–4.00)

## 2020-03-13 LAB — TSH: TSH: 4.14 u[IU]/mL (ref 0.35–4.50)

## 2020-03-15 ENCOUNTER — Encounter: Payer: Self-pay | Admitting: Internal Medicine

## 2020-03-16 ENCOUNTER — Encounter: Payer: Self-pay | Admitting: Internal Medicine

## 2020-03-16 NOTE — Assessment & Plan Note (Signed)
S/p stent LAD x 2.  Continue f/u with cardiology.  Continue risk factor modification.

## 2020-03-16 NOTE — Assessment & Plan Note (Signed)
Overall appears to be handling things well.  Follow.  ?

## 2020-03-16 NOTE — Assessment & Plan Note (Signed)
On crestor.  Overdue labs.  Check lipid panel and liver function today.

## 2020-03-16 NOTE — Assessment & Plan Note (Signed)
Persistent pain.  Check xray. May need podiatry evaluation.

## 2020-03-16 NOTE — Assessment & Plan Note (Signed)
Persistent.  Being worked up by urology.  Discussed the need for f/u given persistent.

## 2020-03-16 NOTE — Assessment & Plan Note (Signed)
Followed by dermatology

## 2020-03-16 NOTE — Assessment & Plan Note (Signed)
Has f/u planned with cardiology next week.

## 2020-03-19 DIAGNOSIS — I2510 Atherosclerotic heart disease of native coronary artery without angina pectoris: Secondary | ICD-10-CM | POA: Diagnosis not present

## 2020-03-19 DIAGNOSIS — E782 Mixed hyperlipidemia: Secondary | ICD-10-CM | POA: Diagnosis not present

## 2020-03-19 DIAGNOSIS — I77810 Thoracic aortic ectasia: Secondary | ICD-10-CM | POA: Diagnosis not present

## 2020-06-10 ENCOUNTER — Telehealth: Payer: Self-pay | Admitting: Internal Medicine

## 2020-06-10 DIAGNOSIS — Z8616 Personal history of COVID-19: Secondary | ICD-10-CM

## 2020-06-10 NOTE — Telephone Encounter (Signed)
Pt wanted to have the covid antibody lab added to his labs on Friday

## 2020-06-11 ENCOUNTER — Telehealth: Payer: Self-pay | Admitting: *Deleted

## 2020-06-11 DIAGNOSIS — E7800 Pure hypercholesterolemia, unspecified: Secondary | ICD-10-CM

## 2020-06-11 DIAGNOSIS — R739 Hyperglycemia, unspecified: Secondary | ICD-10-CM

## 2020-06-11 NOTE — Telephone Encounter (Signed)
Please place future orders for lab appt.   Appt note says fasting labs. COVID antibody test is the only order found.

## 2020-06-11 NOTE — Telephone Encounter (Signed)
Test ordered.  Patient aware.

## 2020-06-11 NOTE — Addendum Note (Signed)
Addended by: Lars Masson on: 06/11/2020 08:52 AM   Modules accepted: Orders

## 2020-06-12 ENCOUNTER — Other Ambulatory Visit: Payer: Self-pay

## 2020-06-12 ENCOUNTER — Other Ambulatory Visit (INDEPENDENT_AMBULATORY_CARE_PROVIDER_SITE_OTHER): Payer: 59

## 2020-06-12 DIAGNOSIS — R739 Hyperglycemia, unspecified: Secondary | ICD-10-CM | POA: Diagnosis not present

## 2020-06-12 DIAGNOSIS — E7800 Pure hypercholesterolemia, unspecified: Secondary | ICD-10-CM

## 2020-06-12 DIAGNOSIS — Z8616 Personal history of COVID-19: Secondary | ICD-10-CM

## 2020-06-12 LAB — LIPID PANEL
Cholesterol: 131 mg/dL (ref 0–200)
HDL: 37.7 mg/dL — ABNORMAL LOW (ref >39.00)
LDL Cholesterol: 77 mg/dL (ref 0–99)
NonHDL: 93.55
Total CHOL/HDL Ratio: 3
Triglycerides: 84 mg/dL (ref 0.0–149.0)
VLDL: 16.8 mg/dL (ref 0.0–40.0)

## 2020-06-12 LAB — HEPATIC FUNCTION PANEL
ALT: 22 U/L (ref 0–53)
AST: 19 U/L (ref 0–37)
Albumin: 4.3 g/dL (ref 3.5–5.2)
Alkaline Phosphatase: 71 U/L (ref 39–117)
Bilirubin, Direct: 0.2 mg/dL (ref 0.0–0.3)
Total Bilirubin: 0.9 mg/dL (ref 0.2–1.2)
Total Protein: 6.7 g/dL (ref 6.0–8.3)

## 2020-06-12 LAB — BASIC METABOLIC PANEL
BUN: 21 mg/dL (ref 6–23)
CO2: 28 mEq/L (ref 19–32)
Calcium: 9.2 mg/dL (ref 8.4–10.5)
Chloride: 104 mEq/L (ref 96–112)
Creatinine, Ser: 0.89 mg/dL (ref 0.40–1.50)
GFR: 93.1 mL/min (ref >60.00)
Glucose, Bld: 106 mg/dL — ABNORMAL HIGH (ref 70–99)
Potassium: 4.4 mEq/L (ref 3.5–5.1)
Sodium: 137 mEq/L (ref 135–145)

## 2020-06-12 LAB — HEMOGLOBIN A1C: Hgb A1c MFr Bld: 5.6 % (ref 4.6–6.5)

## 2020-06-12 NOTE — Telephone Encounter (Signed)
Orders placed for labs

## 2020-06-18 LAB — SARS-COV-2 SEMI-QUANTITATIVE TOTAL ANTIBODY, SPIKE: SARS COV2 AB, Total Spike Semi QN: 411 U/mL — ABNORMAL HIGH (ref <0.8)

## 2020-07-14 ENCOUNTER — Ambulatory Visit (INDEPENDENT_AMBULATORY_CARE_PROVIDER_SITE_OTHER): Payer: 59 | Admitting: Internal Medicine

## 2020-07-14 ENCOUNTER — Other Ambulatory Visit: Payer: Self-pay

## 2020-07-14 ENCOUNTER — Encounter: Payer: Self-pay | Admitting: Internal Medicine

## 2020-07-14 DIAGNOSIS — E7800 Pure hypercholesterolemia, unspecified: Secondary | ICD-10-CM | POA: Diagnosis not present

## 2020-07-14 DIAGNOSIS — Z86006 Personal history of melanoma in-situ: Secondary | ICD-10-CM

## 2020-07-14 DIAGNOSIS — R361 Hematospermia: Secondary | ICD-10-CM

## 2020-07-14 DIAGNOSIS — I2510 Atherosclerotic heart disease of native coronary artery without angina pectoris: Secondary | ICD-10-CM | POA: Diagnosis not present

## 2020-07-14 DIAGNOSIS — Z86010 Personal history of colonic polyps: Secondary | ICD-10-CM | POA: Diagnosis not present

## 2020-07-14 DIAGNOSIS — M25571 Pain in right ankle and joints of right foot: Secondary | ICD-10-CM

## 2020-07-14 DIAGNOSIS — I719 Aortic aneurysm of unspecified site, without rupture: Secondary | ICD-10-CM

## 2020-07-14 NOTE — Progress Notes (Signed)
Patient ID: Jake Wagner, male   DOB: [birth year], 60 y.o.   MRN: [id number]   Subjective:    Patient ID: Jake Wagner, male    DOB: [birth year], 60 y.o.   MRN: [id number]  HPI This visit occurred during the SARS-CoV-2 public health emergency.  Safety protocols were in place, including screening questions prior to the visit, additional usage of staff PPE, and extensive cleaning of exam room while observing appropriate contact time as indicated for disinfecting solutions.  Patient here for a scheduled follow up.  Here to follow up regarding his cholesterol and persistent blood in his semen.  Saw cardiology 02/2020 for f/u CAD s/p PCI to pLAD in 12/2015.  Asymptomatic from cardiac stand point.  Denies any chest pain or sob.  Given persistent bleeding with ejaculation, plavix was stopped.  He remains on aspirin.  This has not made any difference in the bleeding.  Still noticing blood with ejaculation.  Saw urology.  Had MRI.  Told may have prostatitis.  Was given rx for finasteride.  Was concerned about taking secondary to low blood pressure.  No urinary urgency.  No dysuria or hesitancy.  Eating.  No nausea or vomiting reported.  Has started back riding his bike.  Did noticed some previous redness and swelling right fifth toe.  Still some changes, but is better now.     Past Medical History:  Diagnosis Date  . Anginal pain ([place])   . History of chicken pox   . Hypercholesteremia    Past Surgical History:  Procedure Laterality Date  . CARDIAC CATHETERIZATION Left 01/07/2016   Procedure: Left Heart Cath and Coronary Angiography;  Surgeon: Yolonda Kida, MD;  Location: [place] [facility];  Service: Cardiovascular;  Laterality: Left;   Family History  Adopted: Yes   Social History   Socioeconomic History  . Marital status: Married    Spouse name: Not on file  . Number of children: Not on file  . Years of education: Not on file  . Highest education level: Not on file  Occupational  History  . Not on file  Tobacco Use  . Smoking status: Never Smoker  . Smokeless tobacco: Never Used  Substance and Sexual Activity  . Alcohol use: Yes    Alcohol/week: 0.0 standard drinks  . Drug use: Not on file  . Sexual activity: Not on file  Other Topics Concern  . Not on file  Social History Narrative  . Not on file   Social Determinants of Health   Financial Resource Strain: Not on file  Food Insecurity: Not on file  Transportation Needs: Not on file  Physical Activity: Not on file  Stress: Not on file  Social Connections: Not on file    Outpatient Encounter Medications as of 07/14/2020  Medication Sig  . aspirin 81 MG EC tablet Take 81 mg by mouth daily.  . hydrocortisone (ANUSOL-HC) 2.5 % rectal cream Place 1 application rectally 2 (two) times daily.  . hydrocortisone-pramoxine (ANALPRAM HC) 2.5-1 % rectal cream Place 1 application rectally 2 (two) times daily as needed for hemorrhoids or anal itching.  . nitroGLYCERIN (NITROSTAT) 0.4 MG SL tablet   . rosuvastatin (CRESTOR) 20 MG tablet TAKE 1 TABLET (20 MG TOTAL) BY MOUTH NIGHTLY.  . [DISCONTINUED] clopidogrel (PLAVIX) 75 MG tablet Take 75 mg by mouth daily.   No facility-administered encounter medications on file as of 07/14/2020.    Review of Systems  Constitutional: Negative for appetite change and unexpected weight change.  HENT:  Negative for congestion and sinus pressure.   Respiratory: Negative for cough, chest tightness and shortness of breath.   Cardiovascular: Negative for chest pain, palpitations and leg swelling.  Gastrointestinal: Negative for abdominal pain, diarrhea, nausea and vomiting.  Genitourinary: Negative for difficulty urinating and dysuria.       Notices blood in his semen with ejaculation.   Musculoskeletal: Negative for joint swelling and myalgias.  Skin: Negative for color change and rash.  Neurological: Negative for dizziness, light-headedness and headaches.  Psychiatric/Behavioral:  Negative for agitation and dysphoric mood.       Objective:    Physical Exam Vitals reviewed.  Constitutional:      General: He is not in acute distress.    Appearance: Normal appearance. He is well-developed and well-nourished.  HENT:     Head: Normocephalic and atraumatic.     Right Ear: External ear normal.     Left Ear: External ear normal.  Eyes:     General: No scleral icterus.       Right eye: No discharge.        Left eye: No discharge.     Conjunctiva/sclera: Conjunctivae normal.  Cardiovascular:     Rate and Rhythm: Normal rate and regular rhythm.  Pulmonary:     Effort: Pulmonary effort is normal. No respiratory distress.     Breath sounds: Normal breath sounds.  Abdominal:     General: Bowel sounds are normal.     Palpations: Abdomen is soft.     Tenderness: There is no abdominal tenderness.  Musculoskeletal:        General: No swelling, tenderness or edema.     Cervical back: Neck supple. No tenderness.     Comments: Minimal redness - right fifth toe (appears similar to left fifth toe).  No significant tenderness to palpation.    Lymphadenopathy:     Cervical: No cervical adenopathy.  Skin:    Findings: No erythema or rash.  Neurological:     Mental Status: He is alert.  Psychiatric:        Mood and Affect: Mood and affect and mood normal.        Behavior: Behavior normal.     BP 108/70   Pulse 65   Temp (!) 97.5 F (36.4 C) (Oral)   Resp 16   Ht 6\' 7"  (2.007 m)   Wt 255 lb (115.7 kg)   SpO2 98%   BMI 28.73 kg/m  Wt Readings from Last 3 Encounters:  07/14/20 255 lb (115.7 kg)  03/12/20 249 lb (112.9 kg)  04/18/18 238 lb 6.4 oz (108.1 kg)     Lab Results  Component Value Date   WBC 5.0 03/12/2020   HGB 15.0 03/12/2020   HCT 43.4 03/12/2020   PLT 212.0 03/12/2020   GLUCOSE 106 (H) 06/12/2020   CHOL 131 06/12/2020   TRIG 84.0 06/12/2020   HDL 37.70 (L) 06/12/2020   LDLCALC 77 06/12/2020   ALT 22 06/12/2020   AST 19 06/12/2020   NA  137 06/12/2020   K 4.4 06/12/2020   CL 104 06/12/2020   CREATININE 0.89 06/12/2020   BUN 21 06/12/2020   CO2 28 06/12/2020   TSH 4.14 03/12/2020   PSA 1.80 03/12/2020   HGBA1C 5.6 06/12/2020    DG Shoulder Right  Result Date: 02/18/2016 CLINICAL DATA:  Pain.  Moving injury. EXAM: RIGHT SHOULDER - 2+ VIEW COMPARISON:  No prior. FINDINGS: Glenohumeral degenerative change. No evidence of fracture, dislocation, or separation. Apical pleural  thickening noted consistent scarring. Punctate bony density noted in the right humeral head, most likely a bone island. IMPRESSION: Glenohumeral degenerative change.  No acute abnormality. Electronically Signed   By: Marcello Moores  Register   On: 02/18/2016 14:17       Assessment & Plan:   Problem List Items Addressed This Visit    Aortic aneurysm [facility])    Saw cardiology.  Last check 02/2019.  Recommended q 2 year surveillance.       Blood in semen    Persistent.  Has tried stopping his bike riding.  Off plavix.  MRI prostate ok.  Saw urology.  Discussed possibility of prostatitis.  Was given rx for finasteride.  Has not taken secondary to low blood pressure.  Request f/u with urology - Dr Sherol Dade for reevaluation.        Relevant Orders   Ambulatory referral to Urology   CAD (coronary artery disease)    S/p stent LAD x 2.  Continue risk factor modification.  Continue f/u with cardiology.  Continue crestor.        History of colonic polyps    Colonoscopy 07/2015.  Recommended f/u in 10 years.       History of melanoma in situ    Followed by dermatology.       Hypercholesterolemia    Continue crestor.  Low cholesterol diet and exercise.  Follow lipid panel and liver function tests.        Right ankle pain    Better.  Follow.           Einar Pheasant, MD

## 2020-07-19 ENCOUNTER — Encounter: Payer: Self-pay | Admitting: Internal Medicine

## 2020-07-19 NOTE — Assessment & Plan Note (Signed)
Persistent.  Has tried stopping his bike riding.  Off plavix.  MRI prostate ok.  Saw urology.  Discussed possibility of prostatitis.  Was given rx for finasteride.  Has not taken secondary to low blood pressure.  Request f/u with urology - Dr Sherol Dade for reevaluation.

## 2020-07-19 NOTE — Assessment & Plan Note (Signed)
Followed by dermatology

## 2020-07-19 NOTE — Assessment & Plan Note (Signed)
Continue crestor.  Low cholesterol diet and exercise. Follow lipid panel and liver function tests.   

## 2020-07-19 NOTE — Assessment & Plan Note (Signed)
Saw cardiology.  Last check 02/2019.  Recommended q 2 year surveillance.   

## 2020-07-19 NOTE — Assessment & Plan Note (Signed)
S/p stent LAD x 2.  Continue risk factor modification.  Continue f/u with cardiology.  Continue crestor.  

## 2020-07-19 NOTE — Assessment & Plan Note (Signed)
Colonoscopy 07/2015.  Recommended f/u in 10 years.

## 2020-07-19 NOTE — Assessment & Plan Note (Signed)
Better.  Follow.  

## 2020-07-30 DIAGNOSIS — L812 Freckles: Secondary | ICD-10-CM | POA: Diagnosis not present

## 2020-07-30 DIAGNOSIS — Z1283 Encounter for screening for malignant neoplasm of skin: Secondary | ICD-10-CM | POA: Diagnosis not present

## 2020-07-30 DIAGNOSIS — L718 Other rosacea: Secondary | ICD-10-CM | POA: Diagnosis not present

## 2020-07-30 DIAGNOSIS — Z85828 Personal history of other malignant neoplasm of skin: Secondary | ICD-10-CM | POA: Diagnosis not present

## 2020-07-30 DIAGNOSIS — Z85820 Personal history of malignant melanoma of skin: Secondary | ICD-10-CM | POA: Diagnosis not present

## 2020-07-30 DIAGNOSIS — D225 Melanocytic nevi of trunk: Secondary | ICD-10-CM | POA: Diagnosis not present

## 2020-07-30 DIAGNOSIS — D485 Neoplasm of uncertain behavior of skin: Secondary | ICD-10-CM | POA: Diagnosis not present

## 2020-07-30 DIAGNOSIS — D1801 Hemangioma of skin and subcutaneous tissue: Secondary | ICD-10-CM | POA: Diagnosis not present

## 2020-07-30 DIAGNOSIS — L821 Other seborrheic keratosis: Secondary | ICD-10-CM | POA: Diagnosis not present

## 2020-09-09 ENCOUNTER — Telehealth: Payer: Self-pay | Admitting: Internal Medicine

## 2020-09-09 NOTE — Telephone Encounter (Signed)
lft vm to follow up on referral to [facility] urology. thanks

## 2020-10-07 DIAGNOSIS — R361 Hematospermia: Secondary | ICD-10-CM | POA: Diagnosis not present

## 2020-10-12 ENCOUNTER — Other Ambulatory Visit (INDEPENDENT_AMBULATORY_CARE_PROVIDER_SITE_OTHER): Payer: 59

## 2020-10-12 ENCOUNTER — Other Ambulatory Visit: Payer: Self-pay

## 2020-10-12 DIAGNOSIS — E7800 Pure hypercholesterolemia, unspecified: Secondary | ICD-10-CM | POA: Diagnosis not present

## 2020-10-12 LAB — BASIC METABOLIC PANEL
BUN: 23 mg/dL (ref 6–23)
CO2: 28 mEq/L (ref 19–32)
Calcium: 9.2 mg/dL (ref 8.4–10.5)
Chloride: 104 mEq/L (ref 96–112)
Creatinine, Ser: 0.91 mg/dL (ref 0.40–1.50)
GFR: 91.35 mL/min (ref >60.00)
Glucose, Bld: 98 mg/dL (ref 70–99)
Potassium: 5 mEq/L (ref 3.5–5.1)
Sodium: 138 mEq/L (ref 135–145)

## 2020-10-12 LAB — LIPID PANEL
Cholesterol: 143 mg/dL (ref 0–200)
HDL: 40.4 mg/dL (ref >39.00)
LDL Cholesterol: 86 mg/dL (ref 0–99)
NonHDL: 102.22
Total CHOL/HDL Ratio: 4
Triglycerides: 83 mg/dL (ref 0.0–149.0)
VLDL: 16.6 mg/dL (ref 0.0–40.0)

## 2020-10-12 LAB — HEPATIC FUNCTION PANEL
ALT: 25 U/L (ref 0–53)
AST: 20 U/L (ref 0–37)
Albumin: 4.5 g/dL (ref 3.5–5.2)
Alkaline Phosphatase: 87 U/L (ref 39–117)
Bilirubin, Direct: 0.2 mg/dL (ref 0.0–0.3)
Total Bilirubin: 0.9 mg/dL (ref 0.2–1.2)
Total Protein: 7.2 g/dL (ref 6.0–8.3)

## 2020-10-13 ENCOUNTER — Other Ambulatory Visit: Payer: Self-pay | Admitting: Internal Medicine

## 2020-10-13 DIAGNOSIS — E875 Hyperkalemia: Secondary | ICD-10-CM

## 2020-10-13 NOTE — Progress Notes (Signed)
Order placed for f/u lab.   

## 2020-10-15 DIAGNOSIS — R361 Hematospermia: Secondary | ICD-10-CM | POA: Diagnosis not present

## 2020-10-15 DIAGNOSIS — N411 Chronic prostatitis: Secondary | ICD-10-CM | POA: Diagnosis not present

## 2021-01-20 ENCOUNTER — Encounter: Payer: Self-pay | Admitting: Internal Medicine

## 2021-01-25 ENCOUNTER — Other Ambulatory Visit: Payer: Self-pay

## 2021-01-25 ENCOUNTER — Ambulatory Visit (INDEPENDENT_AMBULATORY_CARE_PROVIDER_SITE_OTHER): Payer: 59 | Admitting: Internal Medicine

## 2021-01-25 ENCOUNTER — Encounter: Payer: Self-pay | Admitting: Internal Medicine

## 2021-01-25 VITALS — BP 114/70 | HR 57 | Temp 97.8°F | Resp 16 | Ht 79.0 in | Wt 243.2 lb

## 2021-01-25 DIAGNOSIS — E7800 Pure hypercholesterolemia, unspecified: Secondary | ICD-10-CM | POA: Diagnosis not present

## 2021-01-25 DIAGNOSIS — R361 Hematospermia: Secondary | ICD-10-CM | POA: Diagnosis not present

## 2021-01-25 DIAGNOSIS — Z86006 Personal history of melanoma in-situ: Secondary | ICD-10-CM | POA: Diagnosis not present

## 2021-01-25 DIAGNOSIS — I719 Aortic aneurysm of unspecified site, without rupture: Secondary | ICD-10-CM

## 2021-01-25 DIAGNOSIS — Z23 Encounter for immunization: Secondary | ICD-10-CM | POA: Diagnosis not present

## 2021-01-25 DIAGNOSIS — Z86010 Personal history of colonic polyps: Secondary | ICD-10-CM

## 2021-01-25 DIAGNOSIS — Z125 Encounter for screening for malignant neoplasm of prostate: Secondary | ICD-10-CM

## 2021-01-25 DIAGNOSIS — I2510 Atherosclerotic heart disease of native coronary artery without angina pectoris: Secondary | ICD-10-CM

## 2021-01-25 DIAGNOSIS — N529 Male erectile dysfunction, unspecified: Secondary | ICD-10-CM

## 2021-01-25 DIAGNOSIS — R739 Hyperglycemia, unspecified: Secondary | ICD-10-CM | POA: Diagnosis not present

## 2021-01-25 DIAGNOSIS — Z0000 Encounter for general adult medical examination without abnormal findings: Secondary | ICD-10-CM

## 2021-01-25 LAB — CBC WITH DIFFERENTIAL/PLATELET
Basophils Absolute: 0 10*3/uL (ref 0.0–0.1)
Basophils Relative: 0.8 % (ref 0.0–3.0)
Eosinophils Absolute: 0.1 10*3/uL (ref 0.0–0.7)
Eosinophils Relative: 3 % (ref 0.0–5.0)
HCT: 44.3 % (ref 39.0–52.0)
Hemoglobin: 14.9 g/dL (ref 13.0–17.0)
Lymphocytes Relative: 30.7 % (ref 12.0–46.0)
Lymphs Abs: 1.3 10*3/uL (ref 0.7–4.0)
MCHC: 33.6 g/dL (ref 30.0–36.0)
MCV: 89.4 fl (ref 78.0–100.0)
Monocytes Absolute: 0.5 10*3/uL (ref 0.1–1.0)
Monocytes Relative: 10.8 % (ref 3.0–12.0)
Neutro Abs: 2.3 10*3/uL (ref 1.4–7.7)
Neutrophils Relative %: 54.7 % (ref 43.0–77.0)
Platelets: 199 10*3/uL (ref 150.0–400.0)
RBC: 4.95 Mil/uL (ref 4.22–5.81)
RDW: 12.8 % (ref 11.5–15.5)
WBC: 4.2 10*3/uL (ref 4.0–10.5)

## 2021-01-25 LAB — TSH: TSH: 4.29 u[IU]/mL (ref 0.35–5.50)

## 2021-01-25 LAB — BASIC METABOLIC PANEL
BUN: 25 mg/dL — ABNORMAL HIGH (ref 6–23)
CO2: 28 mEq/L (ref 19–32)
Calcium: 9.3 mg/dL (ref 8.4–10.5)
Chloride: 103 mEq/L (ref 96–112)
Creatinine, Ser: 0.93 mg/dL (ref 0.40–1.50)
GFR: 88.81 mL/min (ref >60.00)
Glucose, Bld: 94 mg/dL (ref 70–99)
Potassium: 4.6 mEq/L (ref 3.5–5.1)
Sodium: 137 mEq/L (ref 135–145)

## 2021-01-25 LAB — PSA: PSA: 1.6 ng/mL (ref 0.10–4.00)

## 2021-01-25 LAB — HEPATIC FUNCTION PANEL
ALT: 31 U/L (ref 0–53)
AST: 25 U/L (ref 0–37)
Albumin: 4.3 g/dL (ref 3.5–5.2)
Alkaline Phosphatase: 83 U/L (ref 39–117)
Bilirubin, Direct: 0.1 mg/dL (ref 0.0–0.3)
Total Bilirubin: 0.7 mg/dL (ref 0.2–1.2)
Total Protein: 7.1 g/dL (ref 6.0–8.3)

## 2021-01-25 LAB — LIPID PANEL
Cholesterol: 138 mg/dL (ref 0–200)
HDL: 39.4 mg/dL (ref >39.00)
LDL Cholesterol: 84 mg/dL (ref 0–99)
NonHDL: 98.75
Total CHOL/HDL Ratio: 4
Triglycerides: 76 mg/dL (ref 0.0–149.0)
VLDL: 15.2 mg/dL (ref 0.0–40.0)

## 2021-01-25 LAB — HEMOGLOBIN A1C: Hgb A1c MFr Bld: 5.7 % (ref 4.6–6.5)

## 2021-01-25 NOTE — Progress Notes (Signed)
Patient ID: Jake Wagner, male   DOB: 02/23/1961, 60 y.o.   MRN: [id number]   Subjective:    Patient ID: Enos Aikins, male    DOB: [birth year], 60 y.o.   MRN: [id number]  This visit occurred during the SARS-CoV-2 public health emergency.  Safety protocols were in place, including screening questions prior to the visit, additional usage of staff PPE, and extensive cleaning of exam room while observing appropriate contact time as indicated for disinfecting solutions.   Patient here for physical exam.  Chief Complaint  Patient presents with   Annual Exam   .   HPI Here for physical exam.  He is accompanied by his wife.  History obtained from both of them.  He is doing well.  Feels good.  Is staying physically active. No chest pain or sob with increased activity or exertion.  No acid reflux reported.  No abdominal pain.  Bowels moving.  Still noticing occasional blood in semen.  Has seen urology.  Elected not to start finasteride.  Problems with obtaining and sustaining erections.  Had interest in taking cialis.     Past Medical History:  Diagnosis Date   Anginal pain ([place])    History of chicken pox    Hypercholesteremia    Past Surgical History:  Procedure Laterality Date   CARDIAC CATHETERIZATION Left 01/07/2016   Procedure: Left Heart Cath and Coronary Angiography;  Surgeon: Yolonda Kida, MD;  Location: [facility];  Service: Cardiovascular;  Laterality: Left;   Family History  Adopted: Yes   Social History   Socioeconomic History   Marital status: Married    Spouse name: Not on file   Number of children: Not on file   Years of education: Not on file   Highest education level: Not on file  Occupational History   Not on file  Tobacco Use   Smoking status: Never   Smokeless tobacco: Never  Substance and Sexual Activity   Alcohol use: Yes    Alcohol/week: 0.0 standard drinks   Drug use: Not on file   Sexual activity: Not on file  Other Topics Concern    Not on file  Social History Narrative   Not on file   Social Determinants of Health   Financial Resource Strain: Not on file  Food Insecurity: Not on file  Transportation Needs: Not on file  Physical Activity: Not on file  Stress: Not on file  Social Connections: Not on file    Review of Systems  Constitutional:  Negative for appetite change and unexpected weight change.  HENT:  Negative for congestion, sinus pressure and sore throat.   Eyes:  Negative for pain and visual disturbance.  Respiratory:  Negative for cough, chest tightness and shortness of breath.   Cardiovascular:  Negative for chest pain, palpitations and leg swelling.  Gastrointestinal:  Negative for abdominal pain, diarrhea, nausea and vomiting.  Genitourinary:  Negative for difficulty urinating and dysuria.  Musculoskeletal:  Negative for joint swelling and myalgias.  Skin:  Negative for color change and rash.  Neurological:  Negative for dizziness, light-headedness and headaches.  Hematological:  Negative for adenopathy. Does not bruise/bleed easily.  Psychiatric/Behavioral:  Negative for agitation and dysphoric mood.       Objective:     BP 114/70   Pulse (!) 57   Temp 97.8 F (36.6 C)   Resp 16   Ht '6\' 7"'$  (2.007 m)   Wt 243 lb 3.2 oz (110.3 kg)   SpO2 98%  BMI 27.40 kg/m  Wt Readings from Last 3 Encounters:  01/25/21 243 lb 3.2 oz (110.3 kg)  07/14/20 255 lb (115.7 kg)  03/12/20 249 lb (112.9 kg)    Physical Exam Constitutional:      General: He is not in acute distress.    Appearance: Normal appearance. He is well-developed.  HENT:     Head: Normocephalic and atraumatic.     Right Ear: External ear normal.     Left Ear: External ear normal.  Eyes:     General: No scleral icterus.       Right eye: No discharge.        Left eye: No discharge.     Conjunctiva/sclera: Conjunctivae normal.  Neck:     Thyroid: No thyromegaly.  Cardiovascular:     Rate and Rhythm: Normal rate and regular  rhythm.  Pulmonary:     Effort: No respiratory distress.     Breath sounds: Normal breath sounds. No wheezing.  Abdominal:     General: Bowel sounds are normal.     Palpations: Abdomen is soft.     Tenderness: There is no abdominal tenderness.  Musculoskeletal:        General: No swelling or tenderness.     Cervical back: Neck supple. No tenderness.  Lymphadenopathy:     Cervical: No cervical adenopathy.  Skin:    Findings: No erythema or rash.  Neurological:     Mental Status: He is alert and oriented to person, place, and time.  Psychiatric:        Mood and Affect: Mood normal.        Behavior: Behavior normal.     Outpatient Encounter Medications as of 01/25/2021  Medication Sig   aspirin 81 MG EC tablet Take 81 mg by mouth daily.   hydrocortisone (ANUSOL-HC) 2.5 % rectal cream Place 1 application rectally 2 (two) times daily.   hydrocortisone-pramoxine (ANALPRAM HC) 2.5-1 % rectal cream Place 1 application rectally 2 (two) times daily as needed for hemorrhoids or anal itching.   nitroGLYCERIN (NITROSTAT) 0.4 MG SL tablet    [DISCONTINUED] rosuvastatin (CRESTOR) 20 MG tablet TAKE 1 TABLET (20 MG TOTAL) BY MOUTH NIGHTLY.   No facility-administered encounter medications on file as of 01/25/2021.     Lab Results  Component Value Date   WBC 4.2 01/25/2021   HGB 14.9 01/25/2021   HCT 44.3 01/25/2021   PLT 199.0 01/25/2021   GLUCOSE 94 01/25/2021   CHOL 138 01/25/2021   TRIG 76.0 01/25/2021   HDL 39.40 01/25/2021   LDLCALC 84 01/25/2021   ALT 31 01/25/2021   AST 25 01/25/2021   NA 137 01/25/2021   K 4.6 01/25/2021   CL 103 01/25/2021   CREATININE 0.93 01/25/2021   BUN 25 (H) 01/25/2021   CO2 28 01/25/2021   TSH 4.29 01/25/2021   PSA 1.60 01/25/2021   HGBA1C 5.7 01/25/2021    DG Shoulder Right  Result Date: 02/18/2016 CLINICAL DATA:  Pain.  Moving injury. EXAM: RIGHT SHOULDER - 2+ VIEW COMPARISON:  No prior. FINDINGS: Glenohumeral degenerative change. No evidence  of fracture, dislocation, or separation. Apical pleural thickening noted consistent scarring. Punctate bony density noted in the right humeral head, most likely a bone island. IMPRESSION: Glenohumeral degenerative change.  No acute abnormality. Electronically Signed   By: Marcello Moores  Register   On: 02/18/2016 14:17       Assessment & Plan:   Problem List Items Addressed This Visit     Aortic aneurysm (  [place])    Saw cardiology.  Last check 02/2019.  Recommended q 2 year surveillance.        Blood in semen    Still noticing intermittently.  Off plavix.  MRI prostate ok.  Saw urology.  Discussed possibility of prostatitis.  Was given rx for finasteride.  Elects not to take at this time.        CAD (coronary artery disease)    S/p stent LAD x 2.  Continue risk factor modification.  Continue f/u with cardiology.  Continue crestor.        Erectile dysfunction    D/w cardiology - taking cialis.        Health care maintenance    Physical today 01/25/21.  Colonoscopy 07/2015.  Recommended f/u in 10 years.  Check psa.       History of colonic polyps    Colonoscopy 07/2015.  Recommended f/u colonoscopy in 10 years.        History of melanoma in situ    Followed by dermatology.       Hypercholesterolemia    Continue crestor.  Low cholesterol diet and exercise.  Follow lipid panel and liver function tests.        Relevant Orders   CBC with Differential/Platelet (Completed)   Lipid panel (Completed)   Basic metabolic panel (Completed)   Hepatic function panel (Completed)   TSH (Completed)   Other Visit Diagnoses     Routine general medical examination at a health care facility    -  Primary   Hyperglycemia       Relevant Orders   Hemoglobin A1c (Completed)   Prostate cancer screening       Relevant Orders   PSA (Completed)   Need for immunization against influenza       Relevant Orders   Flu Vaccine QUAD 28moIM (Fluarix, Fluzone & Alfiuria Quad PF) (Completed)        CEinar Pheasant MD

## 2021-01-27 ENCOUNTER — Telehealth: Payer: Self-pay

## 2021-01-27 MED ORDER — ROSUVASTATIN CALCIUM 40 MG PO TABS: 40.0000 mg | ORAL_TABLET | Freq: Every day | ORAL | 3 refills | Status: AC

## 2021-01-27 NOTE — Telephone Encounter (Signed)
New rx for crestor 40 mg sent into pt pharmacy

## 2021-01-29 ENCOUNTER — Encounter: Payer: Self-pay | Admitting: Internal Medicine

## 2021-01-30 ENCOUNTER — Encounter: Payer: Self-pay | Admitting: Internal Medicine

## 2021-01-30 DIAGNOSIS — N529 Male erectile dysfunction, unspecified: Secondary | ICD-10-CM | POA: Insufficient documentation

## 2021-01-30 NOTE — Assessment & Plan Note (Signed)
S/p stent LAD x 2.  Continue risk factor modification.  Continue f/u with cardiology.  Continue crestor.  

## 2021-01-30 NOTE — Assessment & Plan Note (Signed)
Followed by dermatology

## 2021-01-30 NOTE — Assessment & Plan Note (Signed)
Still noticing intermittently.  Off plavix.  MRI prostate ok.  Saw urology.  Discussed possibility of prostatitis.  Was given rx for finasteride.  Elects not to take at this time.   

## 2021-01-30 NOTE — Assessment & Plan Note (Signed)
Colonoscopy 07/2015.  Recommended f/u colonoscopy in 10 years.   

## 2021-01-30 NOTE — Assessment & Plan Note (Signed)
Physical today 01/25/21.  Colonoscopy 07/2015.  Recommended f/u in 10 years.  Check psa.

## 2021-01-30 NOTE — Assessment & Plan Note (Signed)
D/w cardiology - taking cialis.

## 2021-01-30 NOTE — Assessment & Plan Note (Signed)
Continue crestor.  Low cholesterol diet and exercise. Follow lipid panel and liver function tests.   

## 2021-01-30 NOTE — Assessment & Plan Note (Signed)
Saw cardiology.  Last check 02/2019.  Recommended q 2 year surveillance.   

## 2021-02-02 NOTE — Telephone Encounter (Signed)
See me before calling.  Dr Linus Galas (cardiology - [facility]) [phone] is his cardiologist. Please contact his office and notify pt interested in cialis.  Not on nitrates.  Please confirm if cardiology ok with him starting.

## 2021-02-05 NOTE — Telephone Encounter (Signed)
Attempted to reach Dr Ronnald Ramp office. Placed on hold and then call was disconnected. Will attempt again

## 2021-02-11 NOTE — Telephone Encounter (Signed)
Left message on nurse line for them to call back to discuss below.

## 2021-02-12 NOTE — Telephone Encounter (Signed)
Bellra from [facility] returned Trisha's phone call. See note below.

## 2021-02-15 NOTE — Telephone Encounter (Signed)
Spoke with patient to let him know that we are still working on this. Spoke with nurse for Dr Ronnald Ramp office- message was sent to provider asking for ok to use cialis. Nurse is supposed to be giving me a call back.

## 2021-02-18 DIAGNOSIS — D1801 Hemangioma of skin and subcutaneous tissue: Secondary | ICD-10-CM | POA: Diagnosis not present

## 2021-02-18 DIAGNOSIS — L820 Inflamed seborrheic keratosis: Secondary | ICD-10-CM | POA: Diagnosis not present

## 2021-02-18 DIAGNOSIS — L812 Freckles: Secondary | ICD-10-CM | POA: Diagnosis not present

## 2021-02-18 DIAGNOSIS — L718 Other rosacea: Secondary | ICD-10-CM | POA: Diagnosis not present

## 2021-02-18 DIAGNOSIS — Z85820 Personal history of malignant melanoma of skin: Secondary | ICD-10-CM | POA: Diagnosis not present

## 2021-02-18 DIAGNOSIS — D225 Melanocytic nevi of trunk: Secondary | ICD-10-CM | POA: Diagnosis not present

## 2021-02-18 DIAGNOSIS — Z85828 Personal history of other malignant neoplasm of skin: Secondary | ICD-10-CM | POA: Diagnosis not present

## 2021-02-18 DIAGNOSIS — Z1283 Encounter for screening for malignant neoplasm of skin: Secondary | ICD-10-CM | POA: Diagnosis not present

## 2021-02-18 DIAGNOSIS — B078 Other viral warts: Secondary | ICD-10-CM | POA: Diagnosis not present

## 2021-02-18 DIAGNOSIS — D2339 Other benign neoplasm of skin of other parts of face: Secondary | ICD-10-CM | POA: Diagnosis not present

## 2021-02-26 NOTE — Telephone Encounter (Signed)
[facility] calling back and states it is okay for Patient to take Cialis

## 2021-02-27 MED ORDER — TADALAFIL 5 MG PO TABS: ORAL_TABLET | ORAL | 1 refills | Status: DC

## 2021-02-27 NOTE — Telephone Encounter (Signed)
Rx sent in for cialis.  My chart message sent.

## 2021-03-25 DIAGNOSIS — I77810 Thoracic aortic ectasia: Secondary | ICD-10-CM | POA: Diagnosis not present

## 2021-03-25 DIAGNOSIS — I2510 Atherosclerotic heart disease of native coronary artery without angina pectoris: Secondary | ICD-10-CM | POA: Diagnosis not present

## 2021-03-25 DIAGNOSIS — I5189 Other ill-defined heart diseases: Secondary | ICD-10-CM | POA: Diagnosis not present

## 2021-03-25 DIAGNOSIS — E782 Mixed hyperlipidemia: Secondary | ICD-10-CM | POA: Diagnosis not present

## 2021-05-20 ENCOUNTER — Telehealth (INDEPENDENT_AMBULATORY_CARE_PROVIDER_SITE_OTHER): Payer: 59 | Admitting: Adult Health

## 2021-05-20 ENCOUNTER — Encounter: Payer: Self-pay | Admitting: Adult Health

## 2021-05-20 VITALS — Temp 99.3°F | Ht 79.02 in | Wt 240.0 lb

## 2021-05-20 DIAGNOSIS — R051 Acute cough: Secondary | ICD-10-CM | POA: Diagnosis not present

## 2021-05-20 DIAGNOSIS — J069 Acute upper respiratory infection, unspecified: Secondary | ICD-10-CM | POA: Diagnosis not present

## 2021-05-20 DIAGNOSIS — R0981 Nasal congestion: Secondary | ICD-10-CM | POA: Diagnosis not present

## 2021-05-20 MED ORDER — AZITHROMYCIN 250 MG PO TABS: ORAL_TABLET | ORAL | 0 refills | Status: AC

## 2021-05-20 MED ORDER — BENZONATATE 100 MG PO CAPS: 100.0000 mg | ORAL_CAPSULE | Freq: Three times a day (TID) | ORAL | 0 refills | Status: DC | PRN

## 2021-05-20 NOTE — Patient Instructions (Signed)
Advised in person evaluation at anytime is advised if any symptoms do not improve, worsen or change at any given time.  Red Flags discussed. The patient was given clear instructions to go to ER or return to medical center if any red flags develop, symptoms do not improve, worsen or new problems develop. They verbalized understanding.   Cough, Adult A cough helps to clear your throat and lungs. A cough may be a sign of an illness or another medical condition. An acute cough may only last 2-3 weeks, while a chronic cough may last 8 or more weeks. Many things can cause a cough. They include: Germs (viruses or bacteria) that attack the airway. Breathing in things that bother (irritate) your lungs. Allergies. Asthma. Mucus that runs down the back of your throat (postnasal drip). Smoking. Acid backing up from the stomach into the tube that moves food from the mouth to the stomach (gastroesophageal reflux). Some medicines. Lung problems. Other medical conditions, such as heart failure or a blood clot in the lung (pulmonary embolism). Follow these instructions at home: Medicines Take over-the-counter and prescription medicines only as told by your doctor. Talk with your doctor before you take medicines that stop a cough (cough suppressants). Lifestyle  Do not smoke, and try not to be around smoke. Do not use any products that contain nicotine or tobacco, such as cigarettes, e-cigarettes, and chewing tobacco. If you need help quitting, ask your doctor. Drink enough fluid to keep your pee (urine) pale yellow. Avoid caffeine. Do not drink alcohol if your doctor tells you not to drink. General instructions  Watch for any changes in your cough. Tell your doctor about them. Always cover your mouth when you cough. Stay away from things that make you cough, such as perfume, candles, campfire smoke, or cleaning products. If the air is dry, use a cool mist vaporizer or humidifier in your home. If  your cough is worse at night, try using extra pillows to raise your head up higher while you sleep. Rest as needed. Keep all follow-up visits as told by your doctor. This is important. Contact a doctor if: You have new symptoms. You cough up pus. Your cough does not get better after 2-3 weeks, or your cough gets worse. Cough medicine does not help your cough and you are not sleeping well. You have pain that gets worse or pain that is not helped with medicine. You have a fever. You are losing weight and you do not know why. You have night sweats. Get help right away if: You cough up blood. You have trouble breathing. Your heartbeat is very fast. These symptoms may be an emergency. Do not wait to see if the symptoms will go away. Get medical help right away. Call your local emergency services (911 in the [place] Do not drive yourself to the hospital. Summary A cough helps to clear your throat and lungs. Many things can cause a cough. Take over-the-counter and prescription medicines only as told by your doctor. Always cover your mouth when you cough. Contact a doctor if you have new symptoms or you have a cough that does not get better or gets worse. This information is not intended to replace advice given to you by your health care provider. Make sure you discuss any questions you have with your health care provider. Document Revised: 07/05/2019 Document Reviewed: 06/04/2018 Elsevier Patient Education  [place] What is this medication? BENZONATATE (ben ZOE na tate) is used to relieve  cough. It works by calming your cough reflex. It belongs to a group of medications called cough suppressants. This medicine may be used for other purposes; ask your health care provider or pharmacist if you have questions. COMMON BRAND NAME(S): Tessalon Perles, Zonatuss What should I tell my care team before I take this medication? They need to know if you have any of these  conditions: Kidney or liver disease An unusual or allergic reaction to benzonatate, anesthetics, other medications, foods, dyes, or preservatives Pregnant or trying to get pregnant Breast-feeding How should I use this medication? Take this medication by mouth with a glass of water. Follow the directions on the prescription label. Avoid breaking, chewing, or sucking the capsule, as this can cause serious side effects. Take your medication at regular intervals. Do not take your medication more often than directed. Talk to your care team about the use of this medication in children. While this medication may be prescribed for children as young as 10 years old for selected conditions, precautions do apply. Overdosage: If you think you have taken too much of this medicine contact a poison control center or emergency room at once. NOTE: This medicine is only for you. Do not share this medicine with others. What if I miss a dose? If you miss a dose, take it as soon as you can. If it is almost time for your next dose, take only that dose. Do not take double or extra doses. What may interact with this medication? Do not take this medication with any of the following: MAOIs like Carbex, Eldepryl, Marplan, Nardil, and Parnate This list may not describe all possible interactions. Give your health care provider a list of all the medicines, herbs, non-prescription drugs, or dietary supplements you use. Also tell them if you smoke, drink alcohol, or use illegal drugs. Some items may interact with your medicine. What should I watch for while using this medication? Tell your care team if your symptoms do not improve or if they get worse. If you have a high fever, skin rash, or headache, see your care team. You may get drowsy or dizzy. Do not drive, use machinery, or do anything that needs mental alertness until you know how this medication affects you. Do not sit or stand up quickly, especially if you are an older  patient. This reduces the risk of dizzy or fainting spells. What side effects may I notice from receiving this medication? Side effects that you should report to your care team as soon as possible: Allergic reactions--skin rash, itching, hives, swelling of the face, lips, tongue, or throat Confusion Hallucinations Side effects that usually do not require medical attention (report to your care team if they continue or are bothersome): Burning or tingling of the tongue, mouth, throat, or face Dizziness Drowsiness This list may not describe all possible side effects. Call your doctor for medical advice about side effects. You may report side effects to FDA at 1-800-FDA-1088. Where should I keep my medication? Keep out of the reach of children. Store at room temperature between 15 and 30 degrees C (59 and 86 degrees F). Keep tightly closed. Protect from light and moisture. Throw away any unused medication after the expiration date. NOTE: This sheet is a summary. It may not cover all possible information. If you have questions about this medicine, talk to your doctor, pharmacist, or health care provider.  2022 Elsevier/Gold Standard (2020-08-26 00:00:00) Azithromycin Tablets What is this medication? AZITHROMYCIN (az ith roe MYE sin) treats  infections caused by bacteria. It belongs to a group of medications called antibiotics. It will not treat colds, the flu, or infections caused by viruses. This medicine may be used for other purposes; ask your health care provider or pharmacist if you have questions. COMMON BRAND NAME(S): Zithromax, Zithromax Tri-Pak, Zithromax Z-Pak What should I tell my care team before I take this medication? They need to know if you have any of these conditions: History of blood diseases, like leukemia History of irregular heartbeat Kidney disease Liver disease Myasthenia gravis An unusual or allergic reaction to azithromycin, erythromycin, other macrolide antibiotics,  foods, dyes, or preservatives Pregnant or trying to get pregnant Breast-feeding How should I use this medication? Take this medication by mouth with a full glass of water. Follow the directions on the prescription label. The tablets can be taken with food or on an empty stomach. If the medication upsets your stomach, take it with food. Take your medication at regular intervals. Do not take your medication more often than directed. Take all of your medication as directed even if you think you are better. Do not skip doses or stop your medication early. Talk to your care team regarding the use of this medication in children. While this medication may be prescribed for children as young as 6 months for selected conditions, precautions do apply. Overdosage: If you think you have taken too much of this medicine contact a poison control center or emergency room at once. NOTE: This medicine is only for you. Do not share this medicine with others. What if I miss a dose? If you miss a dose, take it as soon as you can. If it is almost time for your next dose, take only that dose. Do not take double or extra doses. What may interact with this medication? Do not take this medication with any of the following: Cisapride Dronedarone Pimozide Thioridazine This medication may also interact with the following: Antacids that contain aluminum or magnesium Birth control pills Colchicine Cyclosporine Digoxin Ergot alkaloids like dihydroergotamine, ergotamine Nelfinavir Other medications that prolong the QT interval (an abnormal heart rhythm) Phenytoin Warfarin This list may not describe all possible interactions. Give your health care provider a list of all the medicines, herbs, non-prescription drugs, or dietary supplements you use. Also tell them if you smoke, drink alcohol, or use illegal drugs. Some items may interact with your medicine. What should I watch for while using this medication? Tell your care  team if your symptoms do not start to get better or if they get worse. This medication may cause serious skin reactions. They can happen weeks to months after starting the medication. Contact your care team right away if you notice fevers or flu-like symptoms with a rash. The rash may be red or purple and then turn into blisters or peeling of the skin. Or, you might notice a red rash with swelling of the face, lips or lymph nodes in your neck or under your arms. Do not treat diarrhea with over the counter products. Contact your care team if you have diarrhea that lasts more than 2 days or if it is severe and watery. This medication can make you more sensitive to the sun. Keep out of the sun. If you cannot avoid being in the sun, wear protective clothing and use sunscreen. Do not use sun lamps or tanning beds/booths. What side effects may I notice from receiving this medication? Side effects that you should report to your care team as soon as  possible: Allergic reactions or angioedema--skin rash, itching, hives, swelling of the face, eyes, lips, tongue, arms, or legs, trouble swallowing or breathing Heart rhythm changes--fast or irregular heartbeat, dizziness, feeling faint or lightheaded, chest pain, trouble breathing Liver injury--right upper belly pain, loss of appetite, nausea, light-colored stool, dark yellow or brown urine, yellowing skin or eyes, unusual weakness or fatigue Rash, fever, and swollen lymph nodes Redness, blistering, peeling, or loosening of the skin, including inside the mouth Severe diarrhea, fever Unusual vaginal discharge, itching, or odor Side effects that usually do not require medical attention (report to your care team if they continue or are bothersome): Diarrhea Nausea Stomach pain Vomiting This list may not describe all possible side effects. Call your doctor for medical advice about side effects. You may report side effects to FDA at 1-800-FDA-1088. Where should I  keep my medication? Keep out of the reach of children and pets. Store at room temperature between 15 and 30 degrees C (59 and 86 degrees F). Throw away any unused medication after the expiration date. NOTE: This sheet is a summary. It may not cover all possible information. If you have questions about this medicine, talk to your doctor, pharmacist, or health care provider.  2022 Elsevier/Gold Standard (2020-05-11 00:00:00) Upper Respiratory Infection, Adult An upper respiratory infection (URI) affects the nose, throat, and upper airways that lead to the lungs. The most common type of URI is often called the common cold. URIs usually get better on their own, without medical treatment. What are the causes? A URI is caused by a germ (virus). You may catch these germs by: Breathing in droplets from an infected person's cough or sneeze. Touching something that has the germ on it (is contaminated) and then touching your mouth, nose, or eyes. What increases the risk? You are more likely to get a URI if: You are very young or very old. You have close contact with others, such as at work, school, or a health care facility. You smoke. You have long-term (chronic) heart or lung disease. You have a weakened disease-fighting system (immune system). You have nasal allergies or asthma. You have a lot of stress. You have poor nutrition. What are the signs or symptoms? Runny or stuffy (congested) nose. Cough. Sneezing. Sore throat. Headache. Feeling tired (fatigue). Fever. Not wanting to eat as much as usual. Pain in your forehead, behind your eyes, and over your cheekbones (sinus pain). Muscle aches. Redness or irritation of the eyes. Pressure in the ears or face. How is this treated? URIs usually get better on their own within 7-10 days. Medicines cannot cure URIs, but your doctor may recommend certain medicines to help relieve symptoms, such as: Over-the-counter cold medicines. Medicines to  reduce coughing (cough suppressants). Coughing is a type of defense against infection that helps to clear the nose, throat, windpipe, and lungs (respiratory system). Take these medicines only as told by your doctor. Medicines to lower your fever. Follow these instructions at home: Activity Rest as needed. If you have a fever, stay home from work or school until your fever is gone, or until your doctor says you may return to work or school. You should stay home until you cannot spread the infection anymore (you are not contagious). Your doctor may have you wear a face mask so you have less risk of spreading the infection. Relieving symptoms Rinse your mouth often with salt water. To make salt water, dissolve -1 tsp (3-6 g) of salt in 1 cup (237 mL) of  warm water. Use a cool-mist humidifier to add moisture to the air. This can help you breathe more easily. Eating and drinking  Drink enough fluid to keep your pee (urine) pale yellow. Eat soups and other clear broths. General instructions  Take over-the-counter and prescription medicines only as told by your doctor. Do not smoke or use any products that contain nicotine or tobacco. If you need help quitting, ask your doctor. Avoid being where people are smoking (avoid secondhand smoke). Stay up to date on all your shots (immunizations), and get the flu shot every year. Keep all follow-up visits. How to prevent the spread of infection to others  Wash your hands with soap and water for at least 20 seconds. If you cannot use soap and water, use hand sanitizer. Avoid touching your mouth, face, eyes, or nose. Cough or sneeze into a tissue or your sleeve or elbow. Do not cough or sneeze into your hand or into the air. Contact a doctor if: You are getting worse, not better. You have any of these: A fever or chills. Brown or red mucus in your nose. Yellow or brown fluid (discharge)coming from your nose. Pain in your face, especially when you  bend forward. Swollen neck glands. Pain when you swallow. White areas in the back of your throat. Get help right away if: You have shortness of breath that gets worse. You have very bad or constant: Headache. Ear pain. Pain in your forehead, behind your eyes, and over your cheekbones (sinus pain). Chest pain. You have long-lasting (chronic) lung disease along with any of these: Making high-pitched whistling sounds when you breathe, most often when you breathe out (wheezing). Long-lasting cough (more than 14 days). Coughing up blood. A change in your usual mucus. You have a stiff neck. You have changes in your: Vision. Hearing. Thinking. Mood. These symptoms may be an emergency. Get help right away. Call 911. Do not wait to see if the symptoms will go away. Do not drive yourself to the hospital. Summary An upper respiratory infection (URI) is caused by a germ (virus). The most common type of URI is often called the common cold. URIs usually get better within 7-10 days. Take over-the-counter and prescription medicines only as told by your doctor. This information is not intended to replace advice given to you by your health care provider. Make sure you discuss any questions you have with your health care provider. Document Revised: 12/16/2020 Document Reviewed: 12/16/2020 Elsevier Patient Education  [place].

## 2021-05-20 NOTE — Progress Notes (Signed)
Virtual Visit via Telephone Note  I connected with Jake Wagner on 05/20/21 at  4:00 PM EST by telephone and verified that I am speaking with the correct person using two identifiers.  Location: Patient: at home Provider: Provider: Provider's office at  [facility], [place].      I discussed the limitations, risks, security and privacy concerns of performing an evaluation and management service by telephone and the availability of in person appointments. I also discussed with the patient that there may be a patient responsible charge related to this service. The patient expressed understanding and agreed to proceed.   History of Present Illness: Patient is having  sinus congestion, head congestion and pressure. Started on 05/16/21/ Tested for covid on the 20th and was negative.  Denies any ear pain.  100.8 temperature taking tylenol every 8 hours fever for 4 days. No known exposure.   Denies any history of pneumonia or bronchitis in the past. Mild chest congestion  cough congested, yellow green mucous.    Observations/Objective:   Patient is alert and oriented and responsive to questions Engages in conversation with provider. Speaks in full sentences without any pauses without any shortness of breath or distress.    Assessment and Plan:  Upper respiratory tract infection, unspecified type   Meds ordered this encounter  Medications   benzonatate (TESSALON) 100 MG capsule    Sig: Take 1 capsule (100 mg total) by mouth 3 (three) times daily as needed for cough.    Dispense:  20 capsule    Refill:  0   azithromycin (ZITHROMAX) 250 MG tablet    Sig: Take 2 tablets on day 1, then 1 tablet daily on days 2 through 5    Dispense:  6 tablet    Refill:  0    Mucinex per package instructions.  Nasal saline per package instructions.  Given fever duration we will go ahead and treat with antibiotics and give something for cough Offered chest x ray and  rsv,flu, covid, strep parking lot testing and he declined politely said " he is more comfortable at home ?   Follow Up Instructions:   Advised in person evaluation at anytime is advised if any symptoms do not improve, worsen or change at any given time.  Red Flags discussed. The patient was given clear instructions to go to ER or return to medical center if any red flags develop, symptoms do not improve, worsen or new problems develop. They verbalized understanding.  I discussed the assessment and treatment plan with the patient. The patient was provided an opportunity to ask questions and all were answered. The patient agreed with the plan and demonstrated an understanding of the instructions.   The patient was advised to call back or seek an in-person evaluation if the symptoms worsen or if the condition fails to improve as anticipated.  Advised in person evaluation at anytime is advised if any symptoms do not improve, worsen or change at any given time.  Red Flags discussed. The patient was given clear instructions to go to ER or return to medical center if any red flags develop, symptoms do not improve, worsen or new problems develop. They verbalized understanding.  Return in about 4 days (around 05/24/2021), or if symptoms worsen or fail to improve, for at any time for any worsening symptoms, Go to Emergency room/ urgent care if worse.  I provided 22 minutes of non-face-to-face time during this encounter.   Marcille Buffy, FNP

## 2021-05-25 ENCOUNTER — Telehealth: Payer: Self-pay | Admitting: Internal Medicine

## 2021-05-25 NOTE — Telephone Encounter (Signed)
Placed call to pt to see what symptoms he was still having. LMTCB

## 2021-05-25 NOTE — Telephone Encounter (Signed)
Pt was seen on 12/22 at 4:00 with [facility]. Pt was advised if he finished his medication and was still feeling unwell to let us know. Pt has finished medication and is still not feeling any better.

## 2021-06-03 DIAGNOSIS — I2510 Atherosclerotic heart disease of native coronary artery without angina pectoris: Secondary | ICD-10-CM | POA: Diagnosis not present

## 2021-06-03 DIAGNOSIS — I77810 Thoracic aortic ectasia: Secondary | ICD-10-CM | POA: Diagnosis not present

## 2021-06-03 NOTE — Telephone Encounter (Signed)
error 

## 2021-07-12 ENCOUNTER — Other Ambulatory Visit: Payer: Self-pay

## 2021-07-12 ENCOUNTER — Telehealth (INDEPENDENT_AMBULATORY_CARE_PROVIDER_SITE_OTHER): Payer: 59 | Admitting: Family Medicine

## 2021-07-12 VITALS — Ht 79.0 in | Wt 243.0 lb

## 2021-07-12 DIAGNOSIS — J989 Respiratory disorder, unspecified: Secondary | ICD-10-CM | POA: Diagnosis not present

## 2021-07-12 DIAGNOSIS — U071 COVID-19: Secondary | ICD-10-CM | POA: Diagnosis not present

## 2021-07-12 MED ORDER — MOLNUPIRAVIR EUA 200MG CAPSULE: 4.0000 | ORAL_CAPSULE | Freq: Two times a day (BID) | ORAL | 0 refills | Status: AC

## 2021-07-12 NOTE — Progress Notes (Signed)
Virtual Visit via video Note  This visit type was conducted due to national recommendations for restrictions regarding the COVID-19 pandemic (e.g. social distancing).  This format is felt to be most appropriate for this patient at this time.  All issues noted in this document were discussed and addressed.  No physical exam was performed (except for noted visual exam findings with Video Visits).   I connected with Jake Wagner today at  2:15 PM EST by a video enabled telemedicine application and verified that I am speaking with the correct person using two identifiers. Location patient: home Location provider: work  Persons participating in the virtual visit: patient, provider, Nimai Burbach (wife)  I discussed the limitations, risks, security and privacy concerns of performing an evaluation and management service by telephone and the availability of in person appointments. I also discussed with the patient that there may be a patient responsible charge related to this service. The patient expressed understanding and agreed to proceed.  Reason for visit: same day visit.   HPI: Cough: Patient notes onset of symptoms on 07/10/2021.  He has had some cough and some mild chest congestion.  He said chills and achiness.  Tmax of 102 F.  Since then its been between 100-101 F.  He had sore throat initially though that has resolved.  No postnasal drip.  No taste or smell disturbances.  Minimal exertional shortness of breath.  No COVID exposure.  No flu exposure.  Has been taking Tylenol to help with his fevers.   ROS: See pertinent positives and negatives per HPI.  Past Medical History:  Diagnosis Date   Anginal pain ([place])    History of chicken pox    Hypercholesteremia     Past Surgical History:  Procedure Laterality Date   CARDIAC CATHETERIZATION Left 01/07/2016   Procedure: Left Heart Cath and Coronary Angiography;  Surgeon: Yolonda Kida, MD;  Location: [facility];   Service: Cardiovascular;  Laterality: Left;    Family History  Adopted: Yes    SOCIAL HX: nonsmoker   Current Outpatient Medications:    aspirin 81 MG EC tablet, Take 81 mg by mouth daily., Disp: , Rfl:    benzonatate (TESSALON) 100 MG capsule, Take 1 capsule (100 mg total) by mouth 3 (three) times daily as needed for cough., Disp: 20 capsule, Rfl: 0   hydrocortisone (ANUSOL-HC) 2.5 % rectal cream, Place 1 application rectally 2 (two) times daily., Disp: 30 g, Rfl: 0   hydrocortisone-pramoxine (ANALPRAM HC) 2.5-1 % rectal cream, Place 1 application rectally 2 (two) times daily as needed for hemorrhoids or anal itching., Disp: 30 g, Rfl: 0   nitroGLYCERIN (NITROSTAT) 0.4 MG SL tablet, , Disp: , Rfl:    rosuvastatin (CRESTOR) 40 MG tablet, Take 1 tablet (40 mg total) by mouth daily., Disp: 90 tablet, Rfl: 3   tadalafil (CIALIS) 5 MG tablet, Take one tablet prn erectile dysfunction.  May repeat in 72 hours as directed., Disp: 10 tablet, Rfl: 1  EXAM:  VITALS per patient if applicable:  GENERAL: alert, oriented, appears well and in no acute distress  HEENT: atraumatic, conjunttiva clear, no obvious abnormalities on inspection of external nose and ears  NECK: normal movements of the head and neck  LUNGS: on inspection no signs of respiratory distress, breathing rate appears normal, no obvious gross SOB, gasping or wheezing  CV: no obvious cyanosis  MS: moves all visible extremities without noticeable abnormality  PSYCH/NEURO: pleasant and cooperative, no obvious depression or anxiety,  speech and thought processing grossly intact  ASSESSMENT AND PLAN:  Discussed the following assessment and plan:  Problem List Items Addressed This Visit     Respiratory illness    Recent onset respiratory illness concerning for COVID or flu though other viral illness could be a possible cause.  The patient will do a home COVID test.  We will reach back out to him later this afternoon to determine  the result.  If it is positive we will discuss COVID treatment options at that time.  If it is negative he will come to the office to have a COVID PCR test and a flu test.  He can continue Tylenol over-the-counter as needed for fevers.  Discussed if he develops significant sinus congestion he could try an antihistamine or Flonase.  Also discussed he could try Mucinex if he developed significant congestion.  Advised to seek medical attention for increasing shortness of breath, cough productive of blood, or chest pain.       Return if symptoms worsen or fail to improve.   I discussed the assessment and treatment plan with the patient. The patient was provided an opportunity to ask questions and all were answered. The patient agreed with the plan and demonstrated an understanding of the instructions.   The patient was advised to call back or seek an in-person evaluation if the symptoms worsen or if the condition fails to improve as anticipated.   Tommi Rumps, MD

## 2021-07-12 NOTE — Assessment & Plan Note (Signed)
Recent onset respiratory illness concerning for COVID or flu though other viral illness could be a possible cause.  The patient will do a home COVID test.  We will reach back out to him later this afternoon to determine the result.  If it is positive we will discuss COVID treatment options at that time.  If it is negative he will come to the office to have a COVID PCR test and a flu test.  He can continue Tylenol over-the-counter as needed for fevers.  Discussed if he develops significant sinus congestion he could try an antihistamine or Flonase.  Also discussed he could try Mucinex if he developed significant congestion.  Advised to seek medical attention for increasing shortness of breath, cough productive of blood, or chest pain.

## 2021-07-12 NOTE — Progress Notes (Signed)
The patient's home COVID test was positive.  I discussed quarantine guidelines.  Discussed staying quarantined for at least 5 days after his symptom onset which would be through 07/15/2021.  Discussed if his symptoms are significantly improved and he has been afebrile for at least 24 hours he can come off of quarantine and wear a mask for the next 5 days.  If his symptoms are not improving or he is continuing to have fevers he will remain quarantine for a full 10 days after onset of symptoms.  Discussed treatment options and I opted to treat with molnupiravir.  Discussed risk of GI side effects and dizziness.  Advised to contact us with any side effect issues.  Discussed the purpose of this medication is to reduce the risk of progression to severe illness requiring hospitalization or resulting in death.  Discussed that symptoms may improve after starting on the medication.  He was encouraged to contact us if his symptoms are not improving.  Discussed seeking medical attention for shortness of breath, chest pain, or cough productive of blood.  His wife wondered if he needed an antibiotic given that he was coughing up yellow/green mucus.  I discussed that this is most likely related to his viral illness and that we typically do not see a bacterial secondary infection this early in the course of illness.  Discussed if things worsen at all they could certainly contact us for reevaluation.

## 2021-07-27 ENCOUNTER — Other Ambulatory Visit: Payer: Self-pay

## 2021-07-27 ENCOUNTER — Encounter: Payer: Self-pay | Admitting: Internal Medicine

## 2021-07-27 ENCOUNTER — Ambulatory Visit (INDEPENDENT_AMBULATORY_CARE_PROVIDER_SITE_OTHER): Payer: 59

## 2021-07-27 ENCOUNTER — Ambulatory Visit (INDEPENDENT_AMBULATORY_CARE_PROVIDER_SITE_OTHER): Payer: 59 | Admitting: Internal Medicine

## 2021-07-27 VITALS — BP 104/68 | HR 59 | Temp 97.9°F | Resp 18 | Ht 79.0 in | Wt 246.0 lb

## 2021-07-27 DIAGNOSIS — Z86010 Personal history of colonic polyps: Secondary | ICD-10-CM | POA: Diagnosis not present

## 2021-07-27 DIAGNOSIS — M25512 Pain in left shoulder: Secondary | ICD-10-CM | POA: Diagnosis not present

## 2021-07-27 DIAGNOSIS — Z8616 Personal history of COVID-19: Secondary | ICD-10-CM

## 2021-07-27 DIAGNOSIS — E875 Hyperkalemia: Secondary | ICD-10-CM

## 2021-07-27 DIAGNOSIS — I2510 Atherosclerotic heart disease of native coronary artery without angina pectoris: Secondary | ICD-10-CM

## 2021-07-27 DIAGNOSIS — I719 Aortic aneurysm of unspecified site, without rupture: Secondary | ICD-10-CM | POA: Diagnosis not present

## 2021-07-27 DIAGNOSIS — E7800 Pure hypercholesterolemia, unspecified: Secondary | ICD-10-CM | POA: Diagnosis not present

## 2021-07-27 DIAGNOSIS — R739 Hyperglycemia, unspecified: Secondary | ICD-10-CM | POA: Diagnosis not present

## 2021-07-27 LAB — CBC WITH DIFFERENTIAL/PLATELET
Basophils Absolute: 0 10*3/uL (ref 0.0–0.1)
Basophils Relative: 0.7 % (ref 0.0–3.0)
Eosinophils Absolute: 0.1 10*3/uL (ref 0.0–0.7)
Eosinophils Relative: 2.5 % (ref 0.0–5.0)
HCT: 43.3 % (ref 39.0–52.0)
Hemoglobin: 14.6 g/dL (ref 13.0–17.0)
Lymphocytes Relative: 29.2 % (ref 12.0–46.0)
Lymphs Abs: 1.6 10*3/uL (ref 0.7–4.0)
MCHC: 33.8 g/dL (ref 30.0–36.0)
MCV: 89 fl (ref 78.0–100.0)
Monocytes Absolute: 0.5 10*3/uL (ref 0.1–1.0)
Monocytes Relative: 10 % (ref 3.0–12.0)
Neutro Abs: 3.1 10*3/uL (ref 1.4–7.7)
Neutrophils Relative %: 57.6 % (ref 43.0–77.0)
Platelets: 258 10*3/uL (ref 150.0–400.0)
RBC: 4.86 Mil/uL (ref 4.22–5.81)
RDW: 13.2 % (ref 11.5–15.5)
WBC: 5.4 10*3/uL (ref 4.0–10.5)

## 2021-07-27 LAB — BASIC METABOLIC PANEL
BUN: 23 mg/dL (ref 6–23)
CO2: 30 mEq/L (ref 19–32)
Calcium: 9.6 mg/dL (ref 8.4–10.5)
Chloride: 104 mEq/L (ref 96–112)
Creatinine, Ser: 0.93 mg/dL (ref 0.40–1.50)
GFR: 88.5 mL/min (ref >60.00)
Glucose, Bld: 95 mg/dL (ref 70–99)
Potassium: 4.6 mEq/L (ref 3.5–5.1)
Sodium: 138 mEq/L (ref 135–145)

## 2021-07-27 LAB — HEPATIC FUNCTION PANEL
ALT: 30 U/L (ref 0–53)
AST: 21 U/L (ref 0–37)
Albumin: 4.4 g/dL (ref 3.5–5.2)
Alkaline Phosphatase: 87 U/L (ref 39–117)
Bilirubin, Direct: 0.1 mg/dL (ref 0.0–0.3)
Total Bilirubin: 0.6 mg/dL (ref 0.2–1.2)
Total Protein: 7.2 g/dL (ref 6.0–8.3)

## 2021-07-27 LAB — LIPID PANEL
Cholesterol: 123 mg/dL (ref 0–200)
HDL: 35.7 mg/dL — ABNORMAL LOW (ref >39.00)
LDL Cholesterol: 70 mg/dL (ref 0–99)
NonHDL: 87.37
Total CHOL/HDL Ratio: 3
Triglycerides: 88 mg/dL (ref 0.0–149.0)
VLDL: 17.6 mg/dL (ref 0.0–40.0)

## 2021-07-27 LAB — HEMOGLOBIN A1C: Hgb A1c MFr Bld: 5.7 % (ref 4.6–6.5)

## 2021-07-27 NOTE — Progress Notes (Signed)
Subjective:    Patient ID: Jake Wagner, male    DOB: [birth year], 61 y.o.   MRN: [id number]  This visit occurred during the SARS-CoV-2 public health emergency.  Safety protocols were in place, including screening questions prior to the visit, additional usage of staff PPE, and extensive cleaning of exam room while observing appropriate contact time as indicated for disinfecting solutions.   Patient here for a scheduled follow up.   Chief Complaint  Patient presents with   Hyperlipidemia   Shoulder Pain    Left shoulder X 2 weeks   .   HPI Accompanied by his wife.  History obtained from both of them.  Recently diagnosed with covid 07/12/21.  Treated with molnupiravir.  Feeling better.  Feels like he is getting back to his baseline.  No chest pain or sob reported.  No abdominal pain or bowel change reported.  Followed by cardiology for CAD.  Is s/p PCI to LAd 12/2015.  Last evaluated 03/25/21 - stable.  Continue statin, aspirin, crestor and q 2 year surveillance of mildly dilated ascending aorta.  Does report left shoulder pain.  Persistent.  Hurts to abduct arms and reaching posteriorly.  Has been present for months.     Past Medical History:  Diagnosis Date   Anginal pain ([place])    History of chicken pox    Hypercholesteremia    Past Surgical History:  Procedure Laterality Date   CARDIAC CATHETERIZATION Left 01/07/2016   Procedure: Left Heart Cath and Coronary Angiography;  Surgeon: Yolonda Kida, MD;  Location: [facility];  Service: Cardiovascular;  Laterality: Left;   Family History  Adopted: Yes   Social History   Socioeconomic History   Marital status: Married    Spouse name: Not on file   Number of children: Not on file   Years of education: Not on file   Highest education level: Not on file  Occupational History   Not on file  Tobacco Use   Smoking status: Never   Smokeless tobacco: Never  Substance and Sexual Activity   Alcohol use: Yes     Alcohol/week: 0.0 standard drinks   Drug use: Not on file   Sexual activity: Not on file  Other Topics Concern   Not on file  Social History Narrative   Not on file   Social Determinants of Health   Financial Resource Strain: Not on file  Food Insecurity: Not on file  Transportation Needs: Not on file  Physical Activity: Not on file  Stress: Not on file  Social Connections: Not on file     Review of Systems  Constitutional:  Negative for appetite change and unexpected weight change.  HENT:  Negative for congestion and sinus pressure.   Respiratory:  Negative for cough, chest tightness and shortness of breath.   Cardiovascular:  Negative for chest pain, palpitations and leg swelling.  Gastrointestinal:  Negative for abdominal pain, diarrhea, nausea and vomiting.  Genitourinary:  Negative for difficulty urinating and dysuria.  Musculoskeletal:  Negative for joint swelling and myalgias.       Left shoulder pain as outlined.   Skin:  Negative for color change and rash.  Neurological:  Negative for dizziness, light-headedness and headaches.  Psychiatric/Behavioral:  Negative for agitation and dysphoric mood.       Objective:     BP 104/68    Pulse (!) 59    Temp 97.9 F (36.6 C) (Oral)    Resp 18    Ht  6\' 7"  (2.007 m)    Wt 246 lb (111.6 kg)    SpO2 97%    BMI 27.71 kg/m  Wt Readings from Last 3 Encounters:  07/27/21 246 lb (111.6 kg)  07/12/21 243 lb (110.2 kg)  05/20/21 240 lb (108.9 kg)    Physical Exam Constitutional:      General: He is not in acute distress.    Appearance: Normal appearance. He is well-developed.  HENT:     Head: Normocephalic and atraumatic.     Right Ear: External ear normal.     Left Ear: External ear normal.  Eyes:     General: No scleral icterus.       Right eye: No discharge.        Left eye: No discharge.  Cardiovascular:     Rate and Rhythm: Normal rate and regular rhythm.  Pulmonary:     Effort: Pulmonary effort is normal. No  respiratory distress.     Breath sounds: Normal breath sounds.  Abdominal:     General: Bowel sounds are normal.     Palpations: Abdomen is soft.     Tenderness: There is no abdominal tenderness.  Musculoskeletal:        General: No swelling or tenderness.     Cervical back: Neck supple. No tenderness.  Lymphadenopathy:     Cervical: No cervical adenopathy.  Skin:    Findings: No erythema or rash.  Neurological:     Mental Status: He is alert.  Psychiatric:        Mood and Affect: Mood normal.        Behavior: Behavior normal.     Outpatient Encounter Medications as of 07/27/2021  Medication Sig   aspirin 81 MG EC tablet Take 81 mg by mouth daily.   hydrocortisone (ANUSOL-HC) 2.5 % rectal cream Place 1 application rectally 2 (two) times daily.   hydrocortisone-pramoxine (ANALPRAM HC) 2.5-1 % rectal cream Place 1 application rectally 2 (two) times daily as needed for hemorrhoids or anal itching.   nitroGLYCERIN (NITROSTAT) 0.4 MG SL tablet    rosuvastatin (CRESTOR) 40 MG tablet Take 1 tablet (40 mg total) by mouth daily.   tadalafil (CIALIS) 5 MG tablet Take one tablet prn erectile dysfunction.  May repeat in 72 hours as directed.   [DISCONTINUED] benzonatate (TESSALON) 100 MG capsule Take 1 capsule (100 mg total) by mouth 3 (three) times daily as needed for cough. (Patient not taking: Reported on 07/27/2021)   No facility-administered encounter medications on file as of 07/27/2021.     Lab Results  Component Value Date   WBC 5.4 07/27/2021   HGB 14.6 07/27/2021   HCT 43.3 07/27/2021   PLT 258.0 07/27/2021   GLUCOSE 95 07/27/2021   CHOL 123 07/27/2021   TRIG 88.0 07/27/2021   HDL 35.70 (L) 07/27/2021   LDLCALC 70 07/27/2021   ALT 30 07/27/2021   AST 21 07/27/2021   NA 138 07/27/2021   K 4.6 07/27/2021   CL 104 07/27/2021   CREATININE 0.93 07/27/2021   BUN 23 07/27/2021   CO2 30 07/27/2021   TSH 4.29 01/25/2021   PSA 1.60 01/25/2021   HGBA1C 5.7 07/27/2021    DG  Shoulder Right  Result Date: 02/18/2016 CLINICAL DATA:  Pain.  Moving injury. EXAM: RIGHT SHOULDER - 2+ VIEW COMPARISON:  No prior. FINDINGS: Glenohumeral degenerative change. No evidence of fracture, dislocation, or separation. Apical pleural thickening noted consistent scarring. Punctate bony density noted in the right humeral head, most likely  a bone island. IMPRESSION: Glenohumeral degenerative change.  No acute abnormality. Electronically Signed   By: Marcello Moores  Register   On: 02/18/2016 14:17       Assessment & Plan:   Problem List Items Addressed This Visit     Aortic aneurysm [facility])    Saw cardiology.  Last check 02/2021.  Recommended q 2 year surveillance.        CAD (coronary artery disease)    S/p stent LAD x 2.  Continue risk factor modification.  Continue f/u with cardiology.  Continue crestor.        History of colonic polyps    Colonoscopy 07/2015.  Recommended f/u colonoscopy in 10 years.        History of COVID-19    Took molnupiravir.  Doing better.  Back to baseline.  Follow.       Hypercholesterolemia - Primary    Continue crestor.  Low cholesterol diet and exercise.  Follow lipid panel and liver function tests.        Relevant Orders   Lipid Profile (Completed)   CBC w/Diff (Completed)   Hepatic function panel (Completed)   Left shoulder pain    Persistent pain - left shoulder.  Check xray.  Further w/up pending.       Relevant Orders   DG Shoulder Left (Completed)   Other Visit Diagnoses     Hyperglycemia       Relevant Orders   HgB A1c (Completed)   Hyperkalemia       Relevant Orders   Basic Metabolic Panel (BMET) (Completed)        Einar Pheasant, MD

## 2021-07-30 ENCOUNTER — Telehealth: Payer: Self-pay

## 2021-07-30 ENCOUNTER — Other Ambulatory Visit: Payer: Self-pay

## 2021-07-30 DIAGNOSIS — M25512 Pain in left shoulder: Secondary | ICD-10-CM

## 2021-07-30 NOTE — Telephone Encounter (Signed)
-----   Message from Einar Pheasant, MD sent at 07/29/2021  3:35 AM EST ----- ?Notify Jake Wagner that his shoulder xray reveals no acute fracture.  Does have a small spur.  Given persistent pain and limited rom, I would like to refer him to ortho for further evaluation and treatment.  Can see if he has a preference of which ortho md he wants to see.  If has no preference, please refer to Dr Roland Rack - [facility] ?

## 2021-08-01 ENCOUNTER — Encounter: Payer: Self-pay | Admitting: Internal Medicine

## 2021-08-01 DIAGNOSIS — Z8616 Personal history of COVID-19: Secondary | ICD-10-CM | POA: Insufficient documentation

## 2021-08-01 NOTE — Assessment & Plan Note (Signed)
Persistent pain - left shoulder.  Check xray.  Further w/up pending.  ?

## 2021-08-01 NOTE — Assessment & Plan Note (Signed)
S/p stent LAD x 2.  Continue risk factor modification.  Continue f/u with cardiology.  Continue crestor.  

## 2021-08-01 NOTE — Assessment & Plan Note (Signed)
Continue crestor.  Low cholesterol diet and exercise. Follow lipid panel and liver function tests.   

## 2021-08-01 NOTE — Assessment & Plan Note (Signed)
Colonoscopy 07/2015.  Recommended f/u colonoscopy in 10 years.   

## 2021-08-01 NOTE — Assessment & Plan Note (Signed)
Saw cardiology.  Last check 02/2021.  Recommended q 2 year surveillance.   ?

## 2021-08-01 NOTE — Assessment & Plan Note (Signed)
Took molnupiravir.  Doing better.  Back to baseline.  Follow.  ?

## 2021-08-09 DIAGNOSIS — M7502 Adhesive capsulitis of left shoulder: Secondary | ICD-10-CM | POA: Diagnosis not present

## 2021-08-09 DIAGNOSIS — M7542 Impingement syndrome of left shoulder: Secondary | ICD-10-CM | POA: Diagnosis not present

## 2021-08-09 DIAGNOSIS — M25512 Pain in left shoulder: Secondary | ICD-10-CM | POA: Diagnosis not present

## 2021-08-19 DIAGNOSIS — L821 Other seborrheic keratosis: Secondary | ICD-10-CM | POA: Diagnosis not present

## 2021-08-19 DIAGNOSIS — D2339 Other benign neoplasm of skin of other parts of face: Secondary | ICD-10-CM | POA: Diagnosis not present

## 2021-08-19 DIAGNOSIS — Z85828 Personal history of other malignant neoplasm of skin: Secondary | ICD-10-CM | POA: Diagnosis not present

## 2021-08-19 DIAGNOSIS — D225 Melanocytic nevi of trunk: Secondary | ICD-10-CM | POA: Diagnosis not present

## 2021-08-19 DIAGNOSIS — Z1283 Encounter for screening for malignant neoplasm of skin: Secondary | ICD-10-CM | POA: Diagnosis not present

## 2021-08-19 DIAGNOSIS — L812 Freckles: Secondary | ICD-10-CM | POA: Diagnosis not present

## 2021-08-19 DIAGNOSIS — Z85820 Personal history of malignant melanoma of skin: Secondary | ICD-10-CM | POA: Diagnosis not present

## 2021-08-19 DIAGNOSIS — L718 Other rosacea: Secondary | ICD-10-CM | POA: Diagnosis not present

## 2021-08-19 DIAGNOSIS — D1801 Hemangioma of skin and subcutaneous tissue: Secondary | ICD-10-CM | POA: Diagnosis not present

## 2021-09-24 DIAGNOSIS — Z85828 Personal history of other malignant neoplasm of skin: Secondary | ICD-10-CM | POA: Diagnosis not present

## 2021-09-24 DIAGNOSIS — B353 Tinea pedis: Secondary | ICD-10-CM | POA: Diagnosis not present

## 2021-09-24 DIAGNOSIS — Z85820 Personal history of malignant melanoma of skin: Secondary | ICD-10-CM | POA: Diagnosis not present

## 2022-01-27 ENCOUNTER — Ambulatory Visit (INDEPENDENT_AMBULATORY_CARE_PROVIDER_SITE_OTHER): Payer: No Typology Code available for payment source | Admitting: Internal Medicine

## 2022-01-27 ENCOUNTER — Encounter: Payer: Self-pay | Admitting: Internal Medicine

## 2022-01-27 VITALS — BP 110/70 | HR 56 | Temp 97.9°F | Resp 14 | Ht 79.0 in | Wt 244.8 lb

## 2022-01-27 DIAGNOSIS — N529 Male erectile dysfunction, unspecified: Secondary | ICD-10-CM

## 2022-01-27 DIAGNOSIS — I2510 Atherosclerotic heart disease of native coronary artery without angina pectoris: Secondary | ICD-10-CM

## 2022-01-27 DIAGNOSIS — E7800 Pure hypercholesterolemia, unspecified: Secondary | ICD-10-CM | POA: Diagnosis not present

## 2022-01-27 DIAGNOSIS — R739 Hyperglycemia, unspecified: Secondary | ICD-10-CM | POA: Diagnosis not present

## 2022-01-27 DIAGNOSIS — K649 Unspecified hemorrhoids: Secondary | ICD-10-CM

## 2022-01-27 DIAGNOSIS — Z860100 Personal history of colon polyps, unspecified: Secondary | ICD-10-CM

## 2022-01-27 DIAGNOSIS — E875 Hyperkalemia: Secondary | ICD-10-CM

## 2022-01-27 DIAGNOSIS — Z114 Encounter for screening for human immunodeficiency virus [HIV]: Secondary | ICD-10-CM

## 2022-01-27 DIAGNOSIS — M25512 Pain in left shoulder: Secondary | ICD-10-CM

## 2022-01-27 DIAGNOSIS — I719 Aortic aneurysm of unspecified site, without rupture: Secondary | ICD-10-CM

## 2022-01-27 DIAGNOSIS — Z0000 Encounter for general adult medical examination without abnormal findings: Secondary | ICD-10-CM

## 2022-01-27 DIAGNOSIS — Z1159 Encounter for screening for other viral diseases: Secondary | ICD-10-CM

## 2022-01-27 LAB — BASIC METABOLIC PANEL
BUN: 21 mg/dL (ref 6–23)
CO2: 27 mEq/L (ref 19–32)
Calcium: 9.2 mg/dL (ref 8.4–10.5)
Chloride: 104 mEq/L (ref 96–112)
Creatinine, Ser: 0.94 mg/dL (ref 0.40–1.50)
GFR: 87.07 mL/min (ref >60.00)
Glucose, Bld: 101 mg/dL — ABNORMAL HIGH (ref 70–99)
Potassium: 4.6 mEq/L (ref 3.5–5.1)
Sodium: 138 mEq/L (ref 135–145)

## 2022-01-27 LAB — HEPATIC FUNCTION PANEL
ALT: 26 U/L (ref 0–53)
AST: 26 U/L (ref 0–37)
Albumin: 4.4 g/dL (ref 3.5–5.2)
Alkaline Phosphatase: 75 U/L (ref 39–117)
Bilirubin, Direct: 0.2 mg/dL (ref 0.0–0.3)
Total Bilirubin: 0.8 mg/dL (ref 0.2–1.2)
Total Protein: 7.1 g/dL (ref 6.0–8.3)

## 2022-01-27 LAB — CBC WITH DIFFERENTIAL/PLATELET
Basophils Absolute: 0.1 10*3/uL (ref 0.0–0.1)
Basophils Relative: 1.2 % (ref 0.0–3.0)
Eosinophils Absolute: 0.1 10*3/uL (ref 0.0–0.7)
Eosinophils Relative: 1.3 % (ref 0.0–5.0)
HCT: 43.9 % (ref 39.0–52.0)
Hemoglobin: 15 g/dL (ref 13.0–17.0)
Lymphocytes Relative: 32.7 % (ref 12.0–46.0)
Lymphs Abs: 1.4 10*3/uL (ref 0.7–4.0)
MCHC: 34.2 g/dL (ref 30.0–36.0)
MCV: 89.4 fl (ref 78.0–100.0)
Monocytes Absolute: 0.4 10*3/uL (ref 0.1–1.0)
Monocytes Relative: 10.1 % (ref 3.0–12.0)
Neutro Abs: 2.3 10*3/uL (ref 1.4–7.7)
Neutrophils Relative %: 54.7 % (ref 43.0–77.0)
Platelets: 197 10*3/uL (ref 150.0–400.0)
RBC: 4.91 Mil/uL (ref 4.22–5.81)
RDW: 13.1 % (ref 11.5–15.5)
WBC: 4.3 10*3/uL (ref 4.0–10.5)

## 2022-01-27 LAB — LIPID PANEL
Cholesterol: 122 mg/dL (ref 0–200)
HDL: 39.2 mg/dL (ref >39.00)
LDL Cholesterol: 72 mg/dL (ref 0–99)
NonHDL: 82.52
Total CHOL/HDL Ratio: 3
Triglycerides: 55 mg/dL (ref 0.0–149.0)
VLDL: 11 mg/dL (ref 0.0–40.0)

## 2022-01-27 LAB — HEMOGLOBIN A1C: Hgb A1c MFr Bld: 5.7 % (ref 4.6–6.5)

## 2022-01-27 MED ORDER — HYDROCORTISONE 2.5 % EX CREA
TOPICAL_CREAM | Freq: Two times a day (BID) | CUTANEOUS | 0 refills | Status: AC
Start: 2022-01-27 — End: ?

## 2022-01-27 MED ORDER — TADALAFIL 5 MG PO TABS
ORAL_TABLET | ORAL | 1 refills | Status: DC
Start: 2022-01-27 — End: 2022-07-29

## 2022-01-27 NOTE — Progress Notes (Signed)
Patient ID: Jake Wagner, male   DOB: 03-26-1961, 61 y.o.   MRN: [id number]   Subjective:    Patient ID: Jake Wagner, male    DOB: Jun 01, 1960, 61 y.o.   MRN: [id number]   Patient here for  Chief Complaint  Patient presents with   Annual Exam    Denies any concerns or pain. RF on Cialis sent to [facility] & hydrocortisone-pramoxine sent to [facility]    .   HPI Here for his physical. He is accompanied by his wife.  Reports he is doing relatively well.  Staying active.  Work has been busy.  No chest pain or sob reported.  No abdominal pain.  Bowels moving.  Recently had problems with hemorrhoid.  No problems now.  Discussed referral to surgery for evaluation.  Saw ortho for his left shoulder.  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  S/p injection.  Shoulder is doing better.     Past Medical History:  Diagnosis Date   Anginal pain ([place])    History of chicken pox    Hypercholesteremia    Past Surgical History:  Procedure Laterality Date   CARDIAC CATHETERIZATION Left 01/07/2016   Procedure: Left Heart Cath and Coronary Angiography;  Surgeon: Yolonda Kida, MD;  Location: [facility];  Service: Cardiovascular;  Laterality: Left;   Family History  Adopted: Yes   Social History   Socioeconomic History   Marital status: Married    Spouse name: Not on file   Number of children: Not on file   Years of education: Not on file   Highest education level: Not on file  Occupational History   Not on file  Tobacco Use   Smoking status: Never   Smokeless tobacco: Never  Substance and Sexual Activity   Alcohol use: Yes    Alcohol/week: 0.0 standard drinks of alcohol   Drug use: Not on file   Sexual activity: Not on file  Other Topics Concern   Not on file  Social History Narrative   Not on file   Social Determinants of Health   Financial Resource Strain: Not on file  Food Insecurity: Not on file  Transportation Needs: Not on  file  Physical Activity: Not on file  Stress: Not on file  Social Connections: Not on file     Review of Systems  Constitutional:  Negative for appetite change and unexpected weight change.  HENT:  Negative for congestion, sinus pressure and sore throat.   Eyes:  Negative for pain and visual disturbance.  Respiratory:  Negative for cough, chest tightness and shortness of breath.   Cardiovascular:  Negative for chest pain and palpitations.  Gastrointestinal:  Negative for abdominal pain, diarrhea, nausea and vomiting.  Genitourinary:  Negative for difficulty urinating and dysuria.  Musculoskeletal:  Negative for joint swelling and myalgias.  Skin:  Negative for color change and rash.  Neurological:  Negative for dizziness, light-headedness and headaches.  Hematological:  Negative for adenopathy. Does not bruise/bleed easily.  Psychiatric/Behavioral:  Negative for agitation and dysphoric mood.        Objective:     BP 110/70 (BP Location: Left Arm, Patient Position: Sitting, Cuff Size: Normal)   Pulse (!) 56   Temp 97.9 F (36.6 C) (Oral)   Resp 14   Ht '6\' 7"'$  (2.007 m)   Wt 244 lb 12.8 oz (111 kg)   SpO2 97%   BMI 27.58 kg/m  Wt Readings from Last 3 Encounters:  01/27/22 244 lb 12.8 oz (111 kg)  07/27/21 246 lb (111.6 kg)  07/12/21 243 lb (110.2 kg)    Physical Exam Constitutional:      General: He is not in acute distress.    Appearance: Normal appearance. He is well-developed.  HENT:     Head: Normocephalic and atraumatic.     Right Ear: External ear normal.     Left Ear: External ear normal.  Eyes:     General: No scleral icterus.       Right eye: No discharge.        Left eye: No discharge.     Conjunctiva/sclera: Conjunctivae normal.  Neck:     Thyroid: No thyromegaly.  Cardiovascular:     Rate and Rhythm: Normal rate and regular rhythm.  Pulmonary:     Effort: No respiratory distress.     Breath sounds: Normal breath sounds. No wheezing.  Abdominal:      General: Bowel sounds are normal.     Palpations: Abdomen is soft.     Tenderness: There is no abdominal tenderness.  Genitourinary:    Comments: Rectal exam:  no thrombosed hemorrhoid.  Heme negative.  No palpable abnormality.  Musculoskeletal:        General: No swelling or tenderness.     Cervical back: Neck supple. No tenderness.  Lymphadenopathy:     Cervical: No cervical adenopathy.  Skin:    Findings: No erythema or rash.  Neurological:     Mental Status: He is alert and oriented to person, place, and time.  Psychiatric:        Mood and Affect: Mood normal.        Behavior: Behavior normal.      Outpatient Encounter Medications as of 01/27/2022  Medication Sig   aspirin 81 MG EC tablet Take 81 mg by mouth daily.   hydrocortisone 2.5 % cream Apply topically 2 (two) times daily.   hydrocortisone-pramoxine (ANALPRAM HC) 2.5-1 % rectal cream Place 1 application rectally 2 (two) times daily as needed for hemorrhoids or anal itching.   nitroGLYCERIN (NITROSTAT) 0.4 MG SL tablet    rosuvastatin (CRESTOR) 40 MG tablet Take 1 tablet (40 mg total) by mouth daily.   [DISCONTINUED] tadalafil (CIALIS) 5 MG tablet Take one tablet prn erectile dysfunction.  May repeat in 72 hours as directed.   tadalafil (CIALIS) 5 MG tablet Take one tablet prn erectile dysfunction.  May repeat in 72 hours as directed.   [DISCONTINUED] hydrocortisone (ANUSOL-HC) 2.5 % rectal cream Place 1 application rectally 2 (two) times daily. (Patient not taking: Reported on 01/27/2022)   No facility-administered encounter medications on file as of 01/27/2022.     Lab Results  Component Value Date   WBC 4.3 01/27/2022   HGB 15.0 01/27/2022   HCT 43.9 01/27/2022   PLT 197.0 01/27/2022   GLUCOSE 101 (H) 01/27/2022   CHOL 122 01/27/2022   TRIG 55.0 01/27/2022   HDL 39.20 01/27/2022   LDLCALC 72 01/27/2022   ALT 26 01/27/2022   AST 26 01/27/2022   NA 138 01/27/2022   K 4.6 01/27/2022   CL 104 01/27/2022    CREATININE 0.94 01/27/2022   BUN 21 01/27/2022   CO2 27 01/27/2022   TSH 4.29 01/25/2021   PSA 1.60 01/25/2021   HGBA1C 5.7 01/27/2022    DG Shoulder Right  Result Date: 02/18/2016 CLINICAL DATA:  Pain.  Moving injury. EXAM: RIGHT SHOULDER - 2+ VIEW COMPARISON:  No prior. FINDINGS: Glenohumeral degenerative change. No  evidence of fracture, dislocation, or separation. Apical pleural thickening noted consistent scarring. Punctate bony density noted in the right humeral head, most likely a bone island. IMPRESSION: Glenohumeral degenerative change.  No acute abnormality. Electronically Signed   By: Marcello Moores  Register   On: 02/18/2016 14:17       Assessment & Plan:   Problem List Items Addressed This Visit     Aortic aneurysm [facility])    Saw cardiology.  Last check 02/2019.  Recommended q 2 year surveillance.        Relevant Medications   tadalafil (CIALIS) 5 MG tablet   CAD (coronary artery disease)    S/p stent LAD x 2.  Continue risk factor modification.  Continue f/u with cardiology.  Continue crestor.       Relevant Medications   tadalafil (CIALIS) 5 MG tablet   Erectile dysfunction    Discussed with cardiology.  Taking cialis.       Health care maintenance    Physical today 01/27/22.  Colonoscopy 07/2015.  Recommended f/u in 10 years.  PSA 01/25/21 - 1.6.  Recheck psa.       Hemorrhoid    Has problems intermittently with hemorrhoids.  Recent flare.  Discussed.  No problems now.  Will notify me if desires any further intervention or referral.       Relevant Medications   tadalafil (CIALIS) 5 MG tablet   History of colonic polyps    Colonoscopy 07/2015.  Recommended f/u colonoscopy in 10 years.        Hypercholesterolemia    Continue crestor.  Low cholesterol diet and exercise.  Follow lipid panel and liver function tests.        Relevant Medications   tadalafil (CIALIS) 5 MG tablet   Other Relevant Orders   Basic metabolic panel (Completed)   CBC with  Differential/Platelet (Completed)   Lipid panel (Completed)   Hepatic function panel (Completed)   Left shoulder pain    Recent evaluation by ortho as outlined.  S/p injection.  Doing better.  Follow.       Other Visit Diagnoses     Routine general medical examination at a health care facility    -  Primary   Hyperglycemia       Relevant Orders   Hemoglobin A1c (Completed)   Hyperkalemia       Need for hepatitis C screening test       Relevant Orders   Hepatitis C Antibody (Completed)   Screening for HIV (human immunodeficiency virus)       Relevant Orders   HIV antibody (with reflex) (Completed)        Einar Pheasant, MD

## 2022-01-28 LAB — HEPATITIS C ANTIBODY: Hepatitis C Ab: NONREACTIVE

## 2022-01-28 LAB — HIV ANTIBODY (ROUTINE TESTING W REFLEX): HIV 1&2 Ab, 4th Generation: NONREACTIVE

## 2022-02-01 ENCOUNTER — Encounter: Payer: Self-pay | Admitting: Internal Medicine

## 2022-02-01 ENCOUNTER — Other Ambulatory Visit: Payer: Self-pay | Admitting: Internal Medicine

## 2022-02-01 DIAGNOSIS — Z125 Encounter for screening for malignant neoplasm of prostate: Secondary | ICD-10-CM

## 2022-02-01 NOTE — Progress Notes (Signed)
Order placed for psa.

## 2022-02-01 NOTE — Assessment & Plan Note (Signed)
Saw cardiology.  Last check 02/2019.  Recommended q 2 year surveillance.

## 2022-02-01 NOTE — Assessment & Plan Note (Signed)
Physical today 01/27/22.  Colonoscopy 07/2015.  Recommended f/u in 10 years.  PSA 01/25/21 - 1.6.  Recheck psa.

## 2022-02-01 NOTE — Assessment & Plan Note (Signed)
S/p stent LAD x 2.  Continue risk factor modification.  Continue f/u with cardiology.  Continue crestor.  

## 2022-02-01 NOTE — Assessment & Plan Note (Signed)
Colonoscopy 07/2015.  Recommended f/u colonoscopy in 10 years.   

## 2022-02-01 NOTE — Assessment & Plan Note (Signed)
Recent evaluation by ortho as outlined.  S/p injection.  Doing better.  Follow.

## 2022-02-01 NOTE — Assessment & Plan Note (Signed)
Has problems intermittently with hemorrhoids.  Recent flare.  Discussed.  No problems now.  Will notify me if desires any further intervention or referral.

## 2022-02-01 NOTE — Assessment & Plan Note (Signed)
Discussed with cardiology.  Taking cialis.

## 2022-02-01 NOTE — Assessment & Plan Note (Signed)
Continue crestor.  Low cholesterol diet and exercise. Follow lipid panel and liver function tests.   

## 2022-04-29 ENCOUNTER — Encounter: Payer: Self-pay | Admitting: Internal Medicine

## 2022-04-29 DIAGNOSIS — C44221 Squamous cell carcinoma of skin of unspecified ear and external auricular canal: Secondary | ICD-10-CM | POA: Insufficient documentation

## 2022-05-16 IMAGING — DX DG SHOULDER 2+V*L*
3 series · 3 of 3 positions shown · non-contrast
Comparison: None.

CLINICAL DATA: Persistent left shoulder pain and limited range of
motion, especially above 90 degrees.

EXAM:
LEFT SHOULDER - 2+ VIEW

[shoulder (grashey) ap]
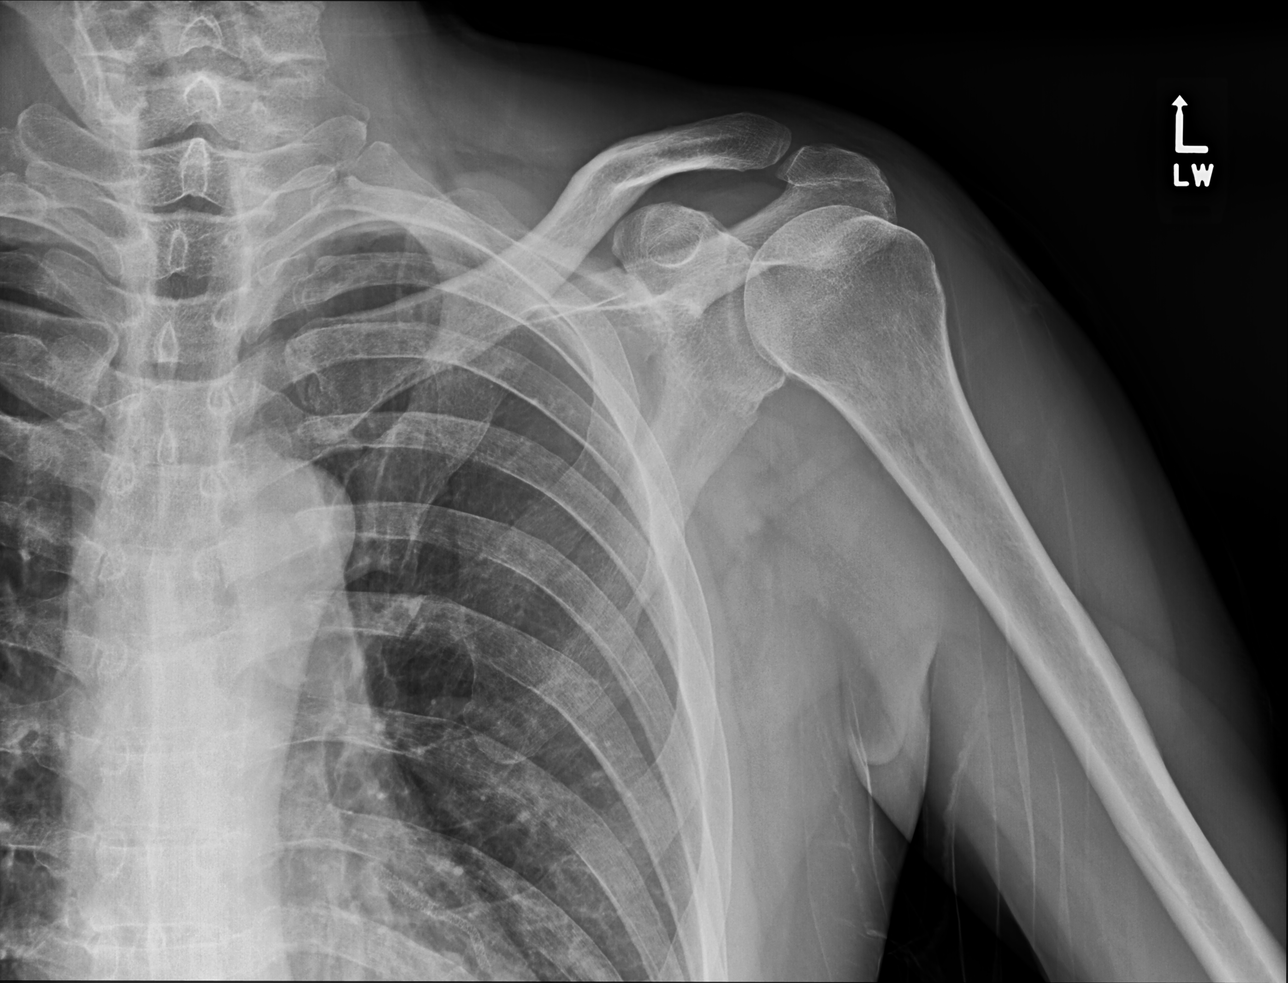

[shoulder y view]
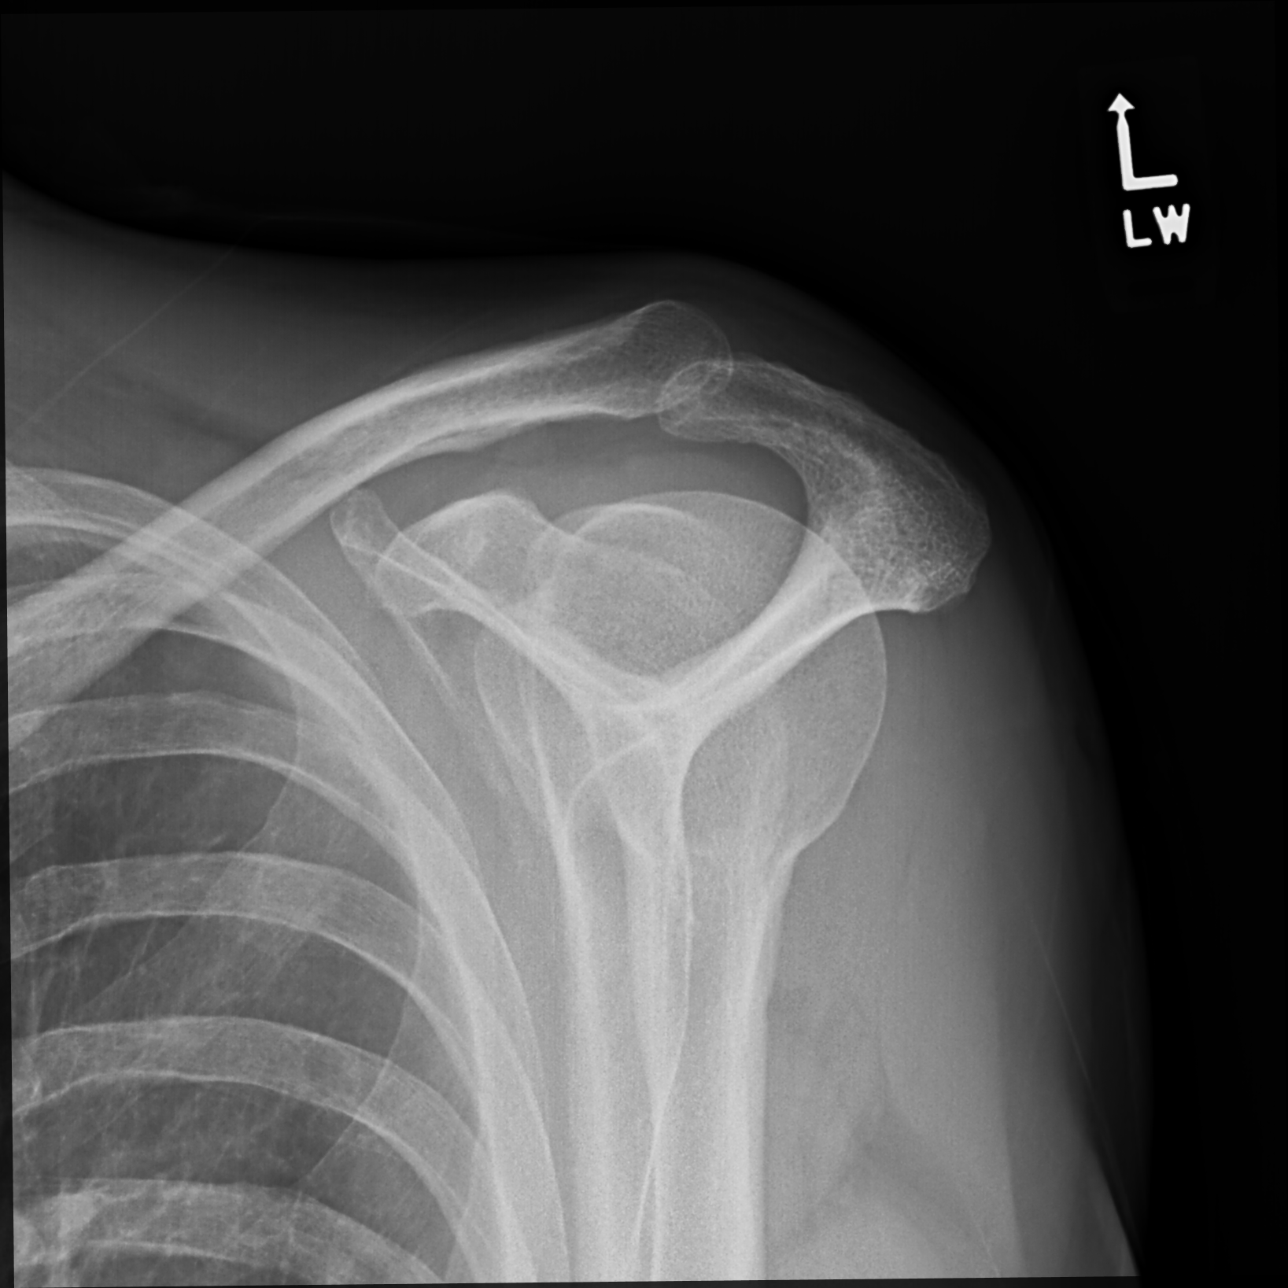

[shoulder axillary pa]
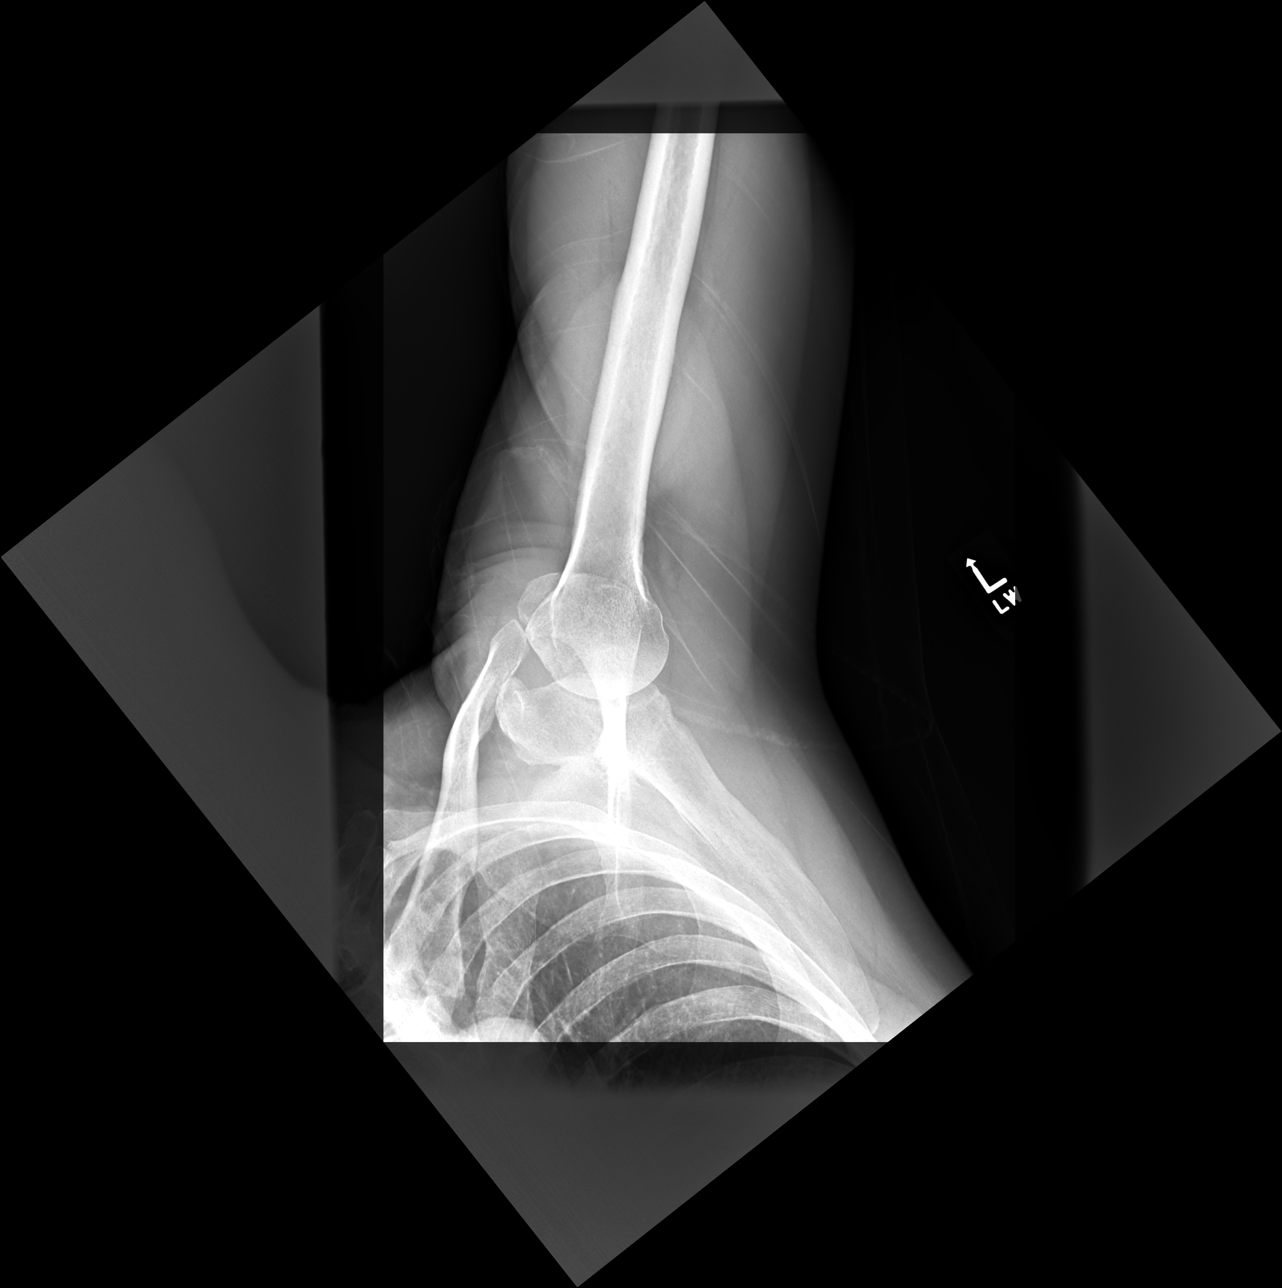

[3 of 3 positions shown; findings below may reference images not displayed]

FINDINGS: Normal alignment. Normal joint spaces. Small subacromial spur. No
fracture, erosion, avascular necrosis or focal bone abnormality. No
soft tissue calcifications.
IMPRESSION: Small subacromial spur.

## 2022-07-29 ENCOUNTER — Encounter: Payer: Self-pay | Admitting: Internal Medicine

## 2022-07-29 ENCOUNTER — Ambulatory Visit (INDEPENDENT_AMBULATORY_CARE_PROVIDER_SITE_OTHER): Payer: No Typology Code available for payment source | Admitting: Internal Medicine

## 2022-07-29 VITALS — BP 112/72 | HR 60 | Temp 98.3°F | Resp 16 | Ht 79.0 in | Wt 251.4 lb

## 2022-07-29 DIAGNOSIS — R5383 Other fatigue: Secondary | ICD-10-CM | POA: Diagnosis not present

## 2022-07-29 DIAGNOSIS — I2510 Atherosclerotic heart disease of native coronary artery without angina pectoris: Secondary | ICD-10-CM

## 2022-07-29 DIAGNOSIS — Z125 Encounter for screening for malignant neoplasm of prostate: Secondary | ICD-10-CM

## 2022-07-29 DIAGNOSIS — E7800 Pure hypercholesterolemia, unspecified: Secondary | ICD-10-CM

## 2022-07-29 DIAGNOSIS — Z860100 Personal history of colon polyps, unspecified: Secondary | ICD-10-CM

## 2022-07-29 DIAGNOSIS — R361 Hematospermia: Secondary | ICD-10-CM

## 2022-07-29 DIAGNOSIS — F439 Reaction to severe stress, unspecified: Secondary | ICD-10-CM

## 2022-07-29 DIAGNOSIS — I719 Aortic aneurysm of unspecified site, without rupture: Secondary | ICD-10-CM | POA: Diagnosis not present

## 2022-07-29 DIAGNOSIS — N529 Male erectile dysfunction, unspecified: Secondary | ICD-10-CM

## 2022-07-29 LAB — HEPATIC FUNCTION PANEL
ALT: 28 U/L (ref 0–53)
AST: 21 U/L (ref 0–37)
Albumin: 4.3 g/dL (ref 3.5–5.2)
Alkaline Phosphatase: 85 U/L (ref 39–117)
Bilirubin, Direct: 0.2 mg/dL (ref 0.0–0.3)
Total Bilirubin: 0.8 mg/dL (ref 0.2–1.2)
Total Protein: 7.1 g/dL (ref 6.0–8.3)

## 2022-07-29 LAB — LIPID PANEL
Cholesterol: 121 mg/dL (ref 0–200)
HDL: 38.5 mg/dL — ABNORMAL LOW (ref >39.00)
LDL Cholesterol: 69 mg/dL (ref 0–99)
NonHDL: 82.84
Total CHOL/HDL Ratio: 3
Triglycerides: 69 mg/dL (ref 0.0–149.0)
VLDL: 13.8 mg/dL (ref 0.0–40.0)

## 2022-07-29 LAB — BASIC METABOLIC PANEL
BUN: 20 mg/dL (ref 6–23)
CO2: 29 mEq/L (ref 19–32)
Calcium: 9.6 mg/dL (ref 8.4–10.5)
Chloride: 104 mEq/L (ref 96–112)
Creatinine, Ser: 0.91 mg/dL (ref 0.40–1.50)
GFR: 90.2 mL/min (ref >60.00)
Glucose, Bld: 93 mg/dL (ref 70–99)
Potassium: 4.8 mEq/L (ref 3.5–5.1)
Sodium: 138 mEq/L (ref 135–145)

## 2022-07-29 LAB — TSH: TSH: 3.8 u[IU]/mL (ref 0.35–5.50)

## 2022-07-29 LAB — PSA: PSA: 2.25 ng/mL (ref 0.10–4.00)

## 2022-07-29 MED ORDER — TADALAFIL 10 MG PO TABS: ORAL_TABLET | ORAL | 1 refills | Status: DC

## 2022-07-29 NOTE — Progress Notes (Unsigned)
Subjective:    Patient ID: Jake Wagner, male    DOB: [birth year], 62 y.o.   MRN: [id number]  Patient here for No chief complaint on file.   HPI Saw ortho for his left shoulder. 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 S/p injection.  Saw cardiology 04/14/22 - f/u CAD s/p PCI to LAD 12/2015. Felt stable.  Recommended to continue aspirin and statin.  F/u mildly dilated ascending aorta.  Recommended q 2 year surveillance.  Last echo 06/2021.     Past Medical History:  Diagnosis Date   Anginal pain ([place])    History of chicken pox    Hypercholesteremia    Past Surgical History:  Procedure Laterality Date   CARDIAC CATHETERIZATION Left 01/07/2016   Procedure: Left Heart Cath and Coronary Angiography;  Surgeon: Yolonda Kida, MD;  Location: [facility];  Service: Cardiovascular;  Laterality: Left;   Family History  Adopted: Yes   Social History   Socioeconomic History   Marital status: Married    Spouse name: Not on file   Number of children: Not on file   Years of education: Not on file   Highest education level: Not on file  Occupational History   Not on file  Tobacco Use   Smoking status: Never   Smokeless tobacco: Never  Substance and Sexual Activity   Alcohol use: Yes    Alcohol/week: 0.0 standard drinks of alcohol   Drug use: Not on file   Sexual activity: Not on file  Other Topics Concern   Not on file  Social History Narrative   Not on file   Social Determinants of Health   Financial Resource Strain: Not on file  Food Insecurity: Not on file  Transportation Needs: Not on file  Physical Activity: Not on file  Stress: Not on file  Social Connections: Not on file     Review of Systems     Objective:     There were no vitals taken for this visit. Wt Readings from Last 3 Encounters:  01/27/22 244 lb 12.8 oz (111 kg)  07/27/21 246 lb (111.6 kg)  07/12/21 243 lb (110.2 kg)    Physical  Exam   Outpatient Encounter Medications as of 07/29/2022  Medication Sig   aspirin 81 MG EC tablet Take 81 mg by mouth daily.   hydrocortisone 2.5 % cream Apply topically 2 (two) times daily.   hydrocortisone-pramoxine (ANALPRAM HC) 2.5-1 % rectal cream Place 1 application rectally 2 (two) times daily as needed for hemorrhoids or anal itching.   nitroGLYCERIN (NITROSTAT) 0.4 MG SL tablet    rosuvastatin (CRESTOR) 40 MG tablet Take 1 tablet (40 mg total) by mouth daily.   tadalafil (CIALIS) 5 MG tablet Take one tablet prn erectile dysfunction.  May repeat in 72 hours as directed.   No facility-administered encounter medications on file as of 07/29/2022.     Lab Results  Component Value Date   WBC 4.3 01/27/2022   HGB 15.0 01/27/2022   HCT 43.9 01/27/2022   PLT 197.0 01/27/2022   GLUCOSE 101 (H) 01/27/2022   CHOL 122 01/27/2022   TRIG 55.0 01/27/2022   HDL 39.20 01/27/2022   LDLCALC 72 01/27/2022   ALT 26 01/27/2022   AST 26 01/27/2022   NA 138 01/27/2022   K 4.6 01/27/2022   CL 104 01/27/2022   CREATININE 0.94 01/27/2022   BUN 21 01/27/2022   CO2 27 01/27/2022   TSH 4.29 01/25/2021  PSA 1.60 01/25/2021   HGBA1C 5.7 01/27/2022    DG Shoulder Right  Result Date: 02/18/2016 CLINICAL DATA:  Pain.  Moving injury. EXAM: RIGHT SHOULDER - 2+ VIEW COMPARISON:  No prior. FINDINGS: Glenohumeral degenerative change. No evidence of fracture, dislocation, or separation. Apical pleural thickening noted consistent scarring. Punctate bony density noted in the right humeral head, most likely a bone island. IMPRESSION: Glenohumeral degenerative change.  No acute abnormality. Electronically Signed   By: Marcello Moores  Register   On: 02/18/2016 14:17       Assessment & Plan:  Hypercholesterolemia  Other fatigue  Prostate cancer screening     Einar Pheasant, MD

## 2022-07-30 ENCOUNTER — Encounter: Payer: Self-pay | Admitting: Internal Medicine

## 2022-07-30 NOTE — Assessment & Plan Note (Signed)
Continue crestor.  Low cholesterol diet and exercise.  Follow lipid panel and liver function tests.  

## 2022-07-30 NOTE — Assessment & Plan Note (Signed)
Increased stress - wife's family medical issues. Overall appears to be handling things well.  Follow.

## 2022-07-30 NOTE — Assessment & Plan Note (Signed)
S/p stent LAD x 2.  Continue risk factor modification.  Continue f/u with cardiology.  Continue crestor.

## 2022-07-30 NOTE — Assessment & Plan Note (Signed)
Still noticing intermittently.  Off plavix.  MRI prostate ok.  Saw urology.  Discussed possibility of prostatitis.  Was given rx for finasteride.  Elects not to take at this time.

## 2022-07-30 NOTE — Assessment & Plan Note (Signed)
Saw cardiology.  Last echo 06/2021.  Recommended q 2 year surveillance.  Has f/u planned in 10-03/2023.

## 2022-07-30 NOTE — Assessment & Plan Note (Signed)
Have discussed with cardiology.  Taking cialis.  Increase dose to '10mg'$ .

## 2022-07-30 NOTE — Assessment & Plan Note (Signed)
Colonoscopy 07/2015.  Recommended f/u colonoscopy in 10 years.

## 2023-01-12 ENCOUNTER — Encounter (INDEPENDENT_AMBULATORY_CARE_PROVIDER_SITE_OTHER): Payer: Self-pay

## 2023-02-01 ENCOUNTER — Encounter: Payer: Self-pay | Admitting: Internal Medicine

## 2023-02-01 ENCOUNTER — Ambulatory Visit: Payer: No Typology Code available for payment source

## 2023-02-01 ENCOUNTER — Ambulatory Visit: Payer: No Typology Code available for payment source | Admitting: Internal Medicine

## 2023-02-01 VITALS — BP 118/72 | HR 55 | Temp 97.6°F | Ht 79.0 in | Wt 238.8 lb

## 2023-02-01 DIAGNOSIS — I2510 Atherosclerotic heart disease of native coronary artery without angina pectoris: Secondary | ICD-10-CM

## 2023-02-01 DIAGNOSIS — I719 Aortic aneurysm of unspecified site, without rupture: Secondary | ICD-10-CM

## 2023-02-01 DIAGNOSIS — E7800 Pure hypercholesterolemia, unspecified: Secondary | ICD-10-CM | POA: Diagnosis not present

## 2023-02-01 DIAGNOSIS — Z125 Encounter for screening for malignant neoplasm of prostate: Secondary | ICD-10-CM

## 2023-02-01 DIAGNOSIS — Z86010 Personal history of colonic polyps: Secondary | ICD-10-CM

## 2023-02-01 DIAGNOSIS — Z8701 Personal history of pneumonia (recurrent): Secondary | ICD-10-CM | POA: Diagnosis not present

## 2023-02-01 DIAGNOSIS — Z0000 Encounter for general adult medical examination without abnormal findings: Secondary | ICD-10-CM | POA: Diagnosis not present

## 2023-02-01 DIAGNOSIS — F439 Reaction to severe stress, unspecified: Secondary | ICD-10-CM

## 2023-02-01 DIAGNOSIS — N529 Male erectile dysfunction, unspecified: Secondary | ICD-10-CM

## 2023-02-01 DIAGNOSIS — R361 Hematospermia: Secondary | ICD-10-CM

## 2023-02-01 LAB — URINALYSIS, ROUTINE W REFLEX MICROSCOPIC
Bilirubin Urine: NEGATIVE
Hgb urine dipstick: NEGATIVE
Ketones, ur: NEGATIVE
Leukocytes,Ua: NEGATIVE
Nitrite: NEGATIVE
RBC / HPF: NONE SEEN (ref 0–?)
Specific Gravity, Urine: 1.02 (ref 1.000–1.030)
Total Protein, Urine: NEGATIVE
Urine Glucose: NEGATIVE
Urobilinogen, UA: 0.2 (ref 0.0–1.0)
pH: 6 (ref 5.0–8.0)

## 2023-02-01 LAB — BASIC METABOLIC PANEL
BUN: 20 mg/dL (ref 6–23)
CO2: 29 meq/L (ref 19–32)
Calcium: 9.4 mg/dL (ref 8.4–10.5)
Chloride: 103 meq/L (ref 96–112)
Creatinine, Ser: 0.89 mg/dL (ref 0.40–1.50)
GFR: 91.39 mL/min (ref >60.00)
Glucose, Bld: 97 mg/dL (ref 70–99)
Potassium: 4.6 meq/L (ref 3.5–5.1)
Sodium: 137 meq/L (ref 135–145)

## 2023-02-01 LAB — LIPID PANEL
Cholesterol: 129 mg/dL (ref 0–200)
HDL: 35.1 mg/dL — ABNORMAL LOW (ref >39.00)
LDL Cholesterol: 78 mg/dL (ref 0–99)
NonHDL: 93.56
Total CHOL/HDL Ratio: 4
Triglycerides: 79 mg/dL (ref 0.0–149.0)
VLDL: 15.8 mg/dL (ref 0.0–40.0)

## 2023-02-01 LAB — HEPATIC FUNCTION PANEL
ALT: 32 U/L (ref 0–53)
AST: 24 U/L (ref 0–37)
Albumin: 4.2 g/dL (ref 3.5–5.2)
Alkaline Phosphatase: 86 U/L (ref 39–117)
Bilirubin, Direct: 0.2 mg/dL (ref 0.0–0.3)
Total Bilirubin: 0.9 mg/dL (ref 0.2–1.2)
Total Protein: 7.5 g/dL (ref 6.0–8.3)

## 2023-02-01 LAB — CBC WITH DIFFERENTIAL/PLATELET
Basophils Absolute: 0 10*3/uL (ref 0.0–0.1)
Basophils Relative: 0.9 % (ref 0.0–3.0)
Eosinophils Absolute: 0.1 10*3/uL (ref 0.0–0.7)
Eosinophils Relative: 2.5 % (ref 0.0–5.0)
HCT: 45.3 % (ref 39.0–52.0)
Hemoglobin: 14.8 g/dL (ref 13.0–17.0)
Lymphocytes Relative: 21.7 % (ref 12.0–46.0)
Lymphs Abs: 1.2 10*3/uL (ref 0.7–4.0)
MCHC: 32.6 g/dL (ref 30.0–36.0)
MCV: 90.2 fl (ref 78.0–100.0)
Monocytes Absolute: 0.5 10*3/uL (ref 0.1–1.0)
Monocytes Relative: 8.4 % (ref 3.0–12.0)
Neutro Abs: 3.7 10*3/uL (ref 1.4–7.7)
Neutrophils Relative %: 66.5 % (ref 43.0–77.0)
Platelets: 243 10*3/uL (ref 150.0–400.0)
RBC: 5.02 Mil/uL (ref 4.22–5.81)
RDW: 13.9 % (ref 11.5–15.5)
WBC: 5.5 10*3/uL (ref 4.0–10.5)

## 2023-02-01 LAB — PSA: PSA: 2 ng/mL (ref 0.10–4.00)

## 2023-02-01 MED ORDER — TADALAFIL 20 MG PO TABS: 20.0000 mg | ORAL_TABLET | ORAL | 1 refills | Status: DC | PRN

## 2023-02-01 NOTE — Progress Notes (Signed)
Subjective:    Patient ID: Jake Wagner, male    DOB: [birth year], 62 y.o.   MRN: [id number]  Patient here for  Chief Complaint  Patient presents with   Annual Exam    HPI Here for a physical exam. Saw cardiology 04/14/22 - f/u CAD s/p PCI to LAD 12/2015. Felt stable. Recommended to continue aspirin and statin. F/u mildly dilated ascending aorta. Recommended q 2 year surveillance. Last echo 06/2021. Is scheduled for f/u 10-03/2023. Saw ortho 10/18/22 - lateral epicondylitis s/p injection. Helped. Was diagnosed with pneumonia 12/27/22 - treated with augmentin and doxycycline. Feeling much better.  Still some cough, but appears to be minimal.  No sob.  No chest pain.  No acid reflux.  No abdominal pain or bowel change. Needs f/u cxr.    Past Medical History:  Diagnosis Date   Anginal pain ([facility])    History of chicken pox    Hypercholesteremia    Past Surgical History:  Procedure Laterality Date   CARDIAC CATHETERIZATION Left 01/07/2016   Procedure: Left Heart Cath and Coronary Angiography;  Surgeon: Alwyn Pea, MD;  Location: [facility];  Service: Cardiovascular;  Laterality: Left;   Family History  Adopted: Yes   Social History   Socioeconomic History   Marital status: Married    Spouse name: Not on file   Number of children: Not on file   Years of education: Not on file   Highest education level: Not on file  Occupational History   Not on file  Tobacco Use   Smoking status: Never   Smokeless tobacco: Never  Substance and Sexual Activity   Alcohol use: Yes    Alcohol/week: 0.0 standard drinks of alcohol   Drug use: Not on file   Sexual activity: Not on file  Other Topics Concern   Not on file  Social History Narrative   Not on file   Social Determinants of Health   Financial Resource Strain: Not on file  Food Insecurity: Not on file  Transportation Needs: Not on file  Physical Activity: Not on file  Stress: Not on file  Social Connections: Not  on file     Review of Systems  Constitutional:  Negative for appetite change and unexpected weight change.  HENT:  Negative for sinus pressure and sore throat.        No increased congestion.   Eyes:  Negative for pain and visual disturbance.  Respiratory:  Negative for chest tightness and shortness of breath.        Occasional minimal cough.   Cardiovascular:  Negative for chest pain, palpitations and leg swelling.  Gastrointestinal:  Negative for abdominal pain, diarrhea, nausea and vomiting.  Genitourinary:  Negative for difficulty urinating and dysuria.  Musculoskeletal:  Negative for joint swelling and myalgias.  Skin:  Negative for color change and rash.  Neurological:  Negative for dizziness and headaches.  Hematological:  Negative for adenopathy. Does not bruise/bleed easily.  Psychiatric/Behavioral:  Negative for agitation and dysphoric mood.        Objective:     BP 118/72   Pulse (!) 55   Temp 97.6 F (36.4 C) (Oral)   Ht 6\' 7"  (2.007 m)   Wt 238 lb 12.8 oz (108.3 kg)   SpO2 99%   BMI 26.90 kg/m  Wt Readings from Last 3 Encounters:  02/01/23 238 lb 12.8 oz (108.3 kg)  07/29/22 251 lb 6.4 oz (114 kg)  01/27/22 244 lb 12.8 oz (111 kg)  Physical Exam Constitutional:      General: He is not in acute distress.    Appearance: Normal appearance. He is well-developed.  HENT:     Head: Normocephalic and atraumatic.     Right Ear: External ear normal.     Left Ear: External ear normal.  Eyes:     General: No scleral icterus.       Right eye: No discharge.        Left eye: No discharge.     Conjunctiva/sclera: Conjunctivae normal.  Neck:     Thyroid: No thyromegaly.  Cardiovascular:     Rate and Rhythm: Normal rate and regular rhythm.  Pulmonary:     Effort: No respiratory distress.     Breath sounds: Normal breath sounds. No wheezing.  Abdominal:     General: Bowel sounds are normal.     Palpations: Abdomen is soft.     Tenderness: There is no  abdominal tenderness.  Genitourinary:    Comments: Rectal exam - no palpable nodule - prostate Musculoskeletal:        General: No swelling or tenderness.     Cervical back: Neck supple. No tenderness.  Lymphadenopathy:     Cervical: No cervical adenopathy.  Skin:    Findings: No erythema or rash.  Neurological:     Mental Status: He is alert and oriented to person, place, and time.  Psychiatric:        Mood and Affect: Mood normal.        Behavior: Behavior normal.      Outpatient Encounter Medications as of 02/01/2023  Medication Sig   tadalafil (CIALIS) 20 MG tablet Take 1 tablet (20 mg total) by mouth every 3 (three) days as needed for erectile dysfunction.   aspirin 81 MG EC tablet Take 81 mg by mouth daily.   hydrocortisone 2.5 % cream Apply topically 2 (two) times daily.   hydrocortisone-pramoxine (ANALPRAM HC) 2.5-1 % rectal cream Place 1 application rectally 2 (two) times daily as needed for hemorrhoids or anal itching.   rosuvastatin (CRESTOR) 40 MG tablet Take 1 tablet (40 mg total) by mouth daily.   [DISCONTINUED] tadalafil (CIALIS) 10 MG tablet Use as directed.   No facility-administered encounter medications on file as of 02/01/2023.     Lab Results  Component Value Date   WBC 5.5 02/01/2023   HGB 14.8 02/01/2023   HCT 45.3 02/01/2023   PLT 243.0 02/01/2023   GLUCOSE 97 02/01/2023   CHOL 129 02/01/2023   TRIG 79.0 02/01/2023   HDL 35.10 (L) 02/01/2023   LDLCALC 78 02/01/2023   ALT 32 02/01/2023   AST 24 02/01/2023   NA 137 02/01/2023   K 4.6 02/01/2023   CL 103 02/01/2023   CREATININE 0.89 02/01/2023   BUN 20 02/01/2023   CO2 29 02/01/2023   TSH 3.80 07/29/2022   PSA 2.00 02/01/2023   HGBA1C 5.7 01/27/2022    DG Shoulder Right  Result Date: 02/18/2016 CLINICAL DATA:  Pain.  Moving injury. EXAM: RIGHT SHOULDER - 2+ VIEW COMPARISON:  No prior. FINDINGS: Glenohumeral degenerative change. No evidence of fracture, dislocation, or separation. Apical  pleural thickening noted consistent scarring. Punctate bony density noted in the right humeral head, most likely a bone island. IMPRESSION: Glenohumeral degenerative change.  No acute abnormality. Electronically Signed   By: Maisie Fus  Register   On: 02/18/2016 14:17       Assessment & Plan:  Health care maintenance Assessment & Plan: Physical today 02/01/23.  Colonoscopy  07/2015.  Recommended f/u in 10 years.  PSA 07/29/22 - 2/25.  Recheck psa.    Hypercholesterolemia Assessment & Plan: Continue crestor.  Low cholesterol diet and exercise.  Follow lipid panel and liver function tests.    Orders: -     CBC with Differential/Platelet -     Lipid panel -     Hepatic function panel -     Basic metabolic panel -     Urinalysis, Routine w reflex microscopic  Prostate cancer screening -     PSA  History of pneumonia Assessment & Plan: Recently diagnosed and treated as outlined.  Symptoms much improved.  No sob.  No chest pain.  Minimal cough.  Recheck cxr to confirm clear.   Orders: -     DG Chest 2 View; Future  Aortic aneurysm without rupture, unspecified portion of aorta [facility]) Assessment & Plan: Saw cardiology.  Last echo 06/2021.  Recommended q 2 year surveillance.  Has f/u planned in 10-03/2023.    Blood in semen Assessment & Plan: Still noticing intermittently.  Off plavix.  MRI prostate ok.  Saw urology.  Discussed possibility of prostatitis.  Was given rx for finasteride.  Elects not to take at this time.     Coronary artery disease involving native coronary artery of native heart without angina pectoris Assessment & Plan: S/p stent LAD x 2.  Continue risk factor modification.  Continue f/u with cardiology.  Continue crestor.    Erectile dysfunction, unspecified erectile dysfunction type Assessment & Plan: Have discussed with cardiology.  Taking cialis.  Increase dose to 20mg .    History of colonic polyps Assessment & Plan: Colonoscopy 07/2015.  Recommended f/u  colonoscopy in 10 years.     Stress Assessment & Plan: Increased stress - wife's family medical issues. Overall appears to be handling things well.  Follow.    Other orders -     Tadalafil; Take 1 tablet (20 mg total) by mouth every 3 (three) days as needed for erectile dysfunction.  Dispense: 10 tablet; Refill: 1     Dale Acton, MD

## 2023-02-01 NOTE — Assessment & Plan Note (Signed)
Physical today 02/01/23.  Colonoscopy 07/2015.  Recommended f/u in 10 years.  PSA 07/29/22 - 2/25.  Recheck psa.

## 2023-02-05 ENCOUNTER — Encounter: Payer: Self-pay | Admitting: Internal Medicine

## 2023-02-05 NOTE — Assessment & Plan Note (Signed)
Increased stress - wife's family medical issues. Overall appears to be handling things well.  Follow.

## 2023-02-05 NOTE — Assessment & Plan Note (Signed)
Recently diagnosed and treated as outlined.  Symptoms much improved.  No sob.  No chest pain.  Minimal cough.  Recheck cxr to confirm clear.

## 2023-02-05 NOTE — Assessment & Plan Note (Signed)
Have discussed with cardiology.  Taking cialis.  Increase dose to 20mg .

## 2023-02-05 NOTE — Assessment & Plan Note (Signed)
Still noticing intermittently.  Off plavix.  MRI prostate ok.  Saw urology.  Discussed possibility of prostatitis.  Was given rx for finasteride.  Elects not to take at this time.   

## 2023-02-05 NOTE — Assessment & Plan Note (Signed)
Saw cardiology.  Last echo 06/2021.  Recommended q 2 year surveillance.  Has f/u planned in 10-03/2023.

## 2023-02-05 NOTE — Assessment & Plan Note (Signed)
Continue crestor.  Low cholesterol diet and exercise.  Follow lipid panel and liver function tests.  

## 2023-02-05 NOTE — Assessment & Plan Note (Signed)
S/p stent LAD x 2.  Continue risk factor modification.  Continue f/u with cardiology.  Continue crestor.  

## 2023-02-05 NOTE — Assessment & Plan Note (Signed)
Colonoscopy 07/2015.  Recommended f/u colonoscopy in 10 years.   

## 2023-02-10 ENCOUNTER — Encounter: Payer: Self-pay | Admitting: *Deleted

## 2023-03-08 ENCOUNTER — Other Ambulatory Visit: Payer: Self-pay | Admitting: Physician Assistant

## 2023-03-08 DIAGNOSIS — S83222A Peripheral tear of medial meniscus, current injury, left knee, initial encounter: Secondary | ICD-10-CM

## 2023-03-14 ENCOUNTER — Encounter: Payer: Self-pay | Admitting: Physician Assistant

## 2023-03-17 ENCOUNTER — Other Ambulatory Visit: Payer: No Typology Code available for payment source

## 2023-04-05 ENCOUNTER — Other Ambulatory Visit: Payer: Self-pay | Admitting: Physician Assistant

## 2023-04-05 DIAGNOSIS — M2391 Unspecified internal derangement of right knee: Secondary | ICD-10-CM

## 2023-04-07 ENCOUNTER — Telehealth: Payer: Self-pay | Admitting: Internal Medicine

## 2023-04-07 NOTE — Telephone Encounter (Signed)
LM for patient to see if he needs this filled.

## 2023-04-07 NOTE — Telephone Encounter (Signed)
Pt called back and the medication need to be refilled

## 2023-04-08 NOTE — Telephone Encounter (Signed)
Refilled tadalafil #10 with one refill.

## 2023-04-14 ENCOUNTER — Encounter: Payer: Self-pay | Admitting: Internal Medicine

## 2023-04-14 DIAGNOSIS — M25561 Pain in right knee: Secondary | ICD-10-CM | POA: Insufficient documentation

## 2023-05-09 ENCOUNTER — Telehealth: Payer: Self-pay

## 2023-05-09 NOTE — Telephone Encounter (Signed)
*  Primary  Pharmacy Patient Advocate Encounter  Received notification from [facility] that Prior Authorization for Tadalafil 20MG  tablets  has been DENIED.  Full denial letter will be uploaded to the media tab. See denial reason below.   PA #/Case ID/Reference #: We have denied your request because it is for more than the amount your plan covers (quantity limit). We reviewed the information we had. We have partially approved your request for this drug up to the amount your plan covers: 6 tablets per month of Cialis (tadalafil) 20 mg. Your request for more drug has been denied. Your doctor can send [place] any new or missing information for [place] to review. For this drug, you may have to meet other criteria. You can request the drug policy for more details. You can also request other plan documents for your review.

## 2023-08-01 ENCOUNTER — Encounter: Payer: Self-pay | Admitting: Internal Medicine

## 2023-08-01 ENCOUNTER — Ambulatory Visit: Payer: No Typology Code available for payment source | Admitting: Internal Medicine

## 2023-08-01 VITALS — BP 104/66 | HR 64 | Temp 98.0°F | Resp 16 | Ht 79.0 in | Wt 256.0 lb

## 2023-08-01 DIAGNOSIS — Z9889 Other specified postprocedural states: Secondary | ICD-10-CM

## 2023-08-01 DIAGNOSIS — E7800 Pure hypercholesterolemia, unspecified: Secondary | ICD-10-CM | POA: Diagnosis not present

## 2023-08-01 DIAGNOSIS — I2510 Atherosclerotic heart disease of native coronary artery without angina pectoris: Secondary | ICD-10-CM | POA: Diagnosis not present

## 2023-08-01 DIAGNOSIS — N529 Male erectile dysfunction, unspecified: Secondary | ICD-10-CM | POA: Diagnosis not present

## 2023-08-01 DIAGNOSIS — I719 Aortic aneurysm of unspecified site, without rupture: Secondary | ICD-10-CM

## 2023-08-01 DIAGNOSIS — F439 Reaction to severe stress, unspecified: Secondary | ICD-10-CM

## 2023-08-01 DIAGNOSIS — Z860100 Personal history of colon polyps, unspecified: Secondary | ICD-10-CM

## 2023-08-01 DIAGNOSIS — K649 Unspecified hemorrhoids: Secondary | ICD-10-CM

## 2023-08-01 LAB — BASIC METABOLIC PANEL
BUN: 17 mg/dL (ref 6–23)
CO2: 29 meq/L (ref 19–32)
Calcium: 9.4 mg/dL (ref 8.4–10.5)
Chloride: 103 meq/L (ref 96–112)
Creatinine, Ser: 0.77 mg/dL (ref 0.40–1.50)
GFR: 95.14 mL/min (ref >60.00)
Glucose, Bld: 114 mg/dL — ABNORMAL HIGH (ref 70–99)
Potassium: 4.6 meq/L (ref 3.5–5.1)
Sodium: 138 meq/L (ref 135–145)

## 2023-08-01 LAB — HEPATIC FUNCTION PANEL
ALT: 27 U/L (ref 0–53)
AST: 19 U/L (ref 0–37)
Albumin: 4.5 g/dL (ref 3.5–5.2)
Alkaline Phosphatase: 77 U/L (ref 39–117)
Bilirubin, Direct: 0.2 mg/dL (ref 0.0–0.3)
Total Bilirubin: 0.7 mg/dL (ref 0.2–1.2)
Total Protein: 7.1 g/dL (ref 6.0–8.3)

## 2023-08-01 LAB — LIPID PANEL
Cholesterol: 130 mg/dL (ref 0–200)
HDL: 42.5 mg/dL (ref >39.00)
LDL Cholesterol: 73 mg/dL (ref 0–99)
NonHDL: 87.78
Total CHOL/HDL Ratio: 3
Triglycerides: 72 mg/dL (ref 0.0–149.0)
VLDL: 14.4 mg/dL (ref 0.0–40.0)

## 2023-08-01 MED ORDER — TADALAFIL 20 MG PO TABS: 20.0000 mg | ORAL_TABLET | ORAL | 1 refills | Status: DC | PRN

## 2023-08-01 NOTE — Assessment & Plan Note (Signed)
 Overall reports he is handling things relatively well.

## 2023-08-01 NOTE — Assessment & Plan Note (Signed)
 Continue crestor.  Low cholesterol diet and exercise.  Follow lipid panel and liver function tests.

## 2023-08-01 NOTE — Assessment & Plan Note (Signed)
Continue cialis

## 2023-08-01 NOTE — Assessment & Plan Note (Signed)
Colonoscopy 07/2015.  Recommended f/u colonoscopy in 10 years.   

## 2023-08-01 NOTE — Assessment & Plan Note (Signed)
 S/p stent LAD x 2.  Continue risk factor modification.  Continue f/u with cardiology.  Continue crestor. No chest pain. Doing well. Back to exercising.

## 2023-08-01 NOTE — Assessment & Plan Note (Signed)
No symptoms currently.

## 2023-08-01 NOTE — Assessment & Plan Note (Signed)
 Doing well s/p recent surgery. Back exercising.

## 2023-08-01 NOTE — Assessment & Plan Note (Signed)
 Saw cardiology 05/11/23 - s/p PCI to LAD in 12/2015. Felt stable. Recommended continuing crestor and daily aspirin. Also recommended annual echo to follow thoracic aortic aneurysm.

## 2023-08-01 NOTE — Progress Notes (Signed)
 Subjective:    Patient ID: Jake Wagner, male    DOB: 1960-09-09, 63 y.o.   MRN: [id number]  Patient here for  Chief Complaint  Patient presents with   Medical Management of Chronic Issues    HPI Here for a scheduled follow up - follow up regarding CAD and hypercholesterolemia. Saw cardiology 05/11/23 - s/p PCI to LAD in 12/2015. Felt stable. Recommended continuing crestor and daily aspirin. Also recommended annual echo to follow thoracic aortic aneurysm. S/p torn meniscus right knee 01/2023. Surgery 05/2023. Doing well with surgery. No increased pain. Getting back into his exercise routine - gradually. No chest pain or sob reported. No cough or congestion reported. No abdominal pain. Bowels stable. No hemorrhoid issues currently.    Past Medical History:  Diagnosis Date   Anginal pain ([facility])    History of chicken pox    Hypercholesteremia    Past Surgical History:  Procedure Laterality Date   CARDIAC CATHETERIZATION Left 01/07/2016   Procedure: Left Heart Cath and Coronary Angiography;  Surgeon: Alwyn Pea, MD;  Location: [facility];  Service: Cardiovascular;  Laterality: Left;   Family History  Adopted: Yes   Social History   Socioeconomic History   Marital status: Married    Spouse name: Not on file   Number of children: Not on file   Years of education: Not on file   Highest education level: Not on file  Occupational History   Not on file  Tobacco Use   Smoking status: Never   Smokeless tobacco: Never  Substance and Sexual Activity   Alcohol use: Yes    Alcohol/week: 0.0 standard drinks of alcohol   Drug use: Not on file   Sexual activity: Not on file  Other Topics Concern   Not on file  Social History Narrative   Not on file   Social Drivers of Health   Financial Resource Strain: Not on file  Food Insecurity: Not on file  Transportation Needs: Not on file  Physical Activity: Not on file  Stress: Not on file  Social Connections: Not on  file     Review of Systems  Constitutional:  Negative for appetite change and unexpected weight change.  HENT:  Negative for congestion and sinus pressure.   Respiratory:  Negative for cough, chest tightness and shortness of breath.   Cardiovascular:  Negative for chest pain, palpitations and leg swelling.  Gastrointestinal:  Negative for abdominal pain, diarrhea, nausea and vomiting.  Genitourinary:  Negative for difficulty urinating and dysuria.  Musculoskeletal:  Negative for joint swelling and myalgias.  Skin:  Negative for color change and rash.  Neurological:  Negative for dizziness and headaches.  Psychiatric/Behavioral:  Negative for agitation and dysphoric mood.        Objective:     BP 104/66   Pulse 64   Temp 98 F (36.7 C)   Resp 16   Ht 6\' 7"  (2.007 m)   Wt 256 lb (116.1 kg)   SpO2 98%   BMI 28.84 kg/m  Wt Readings from Last 3 Encounters:  08/01/23 256 lb (116.1 kg)  02/01/23 238 lb 12.8 oz (108.3 kg)  07/29/22 251 lb 6.4 oz (114 kg)    Physical Exam Vitals reviewed.  Constitutional:      General: He is not in acute distress.    Appearance: Normal appearance. He is well-developed.  HENT:     Head: Normocephalic and atraumatic.     Right Ear: External ear normal.  Left Ear: External ear normal.     Mouth/Throat:     Pharynx: No oropharyngeal exudate or posterior oropharyngeal erythema.  Eyes:     General: No scleral icterus.       Right eye: No discharge.        Left eye: No discharge.     Conjunctiva/sclera: Conjunctivae normal.  Cardiovascular:     Rate and Rhythm: Normal rate and regular rhythm.  Pulmonary:     Effort: Pulmonary effort is normal. No respiratory distress.     Breath sounds: Normal breath sounds.  Abdominal:     General: Bowel sounds are normal.     Palpations: Abdomen is soft.     Tenderness: There is no abdominal tenderness.  Musculoskeletal:        General: No swelling or tenderness.     Cervical back: Neck supple. No  tenderness.  Lymphadenopathy:     Cervical: No cervical adenopathy.  Skin:    Findings: No erythema or rash.  Neurological:     Mental Status: He is alert.  Psychiatric:        Mood and Affect: Mood normal.        Behavior: Behavior normal.         Outpatient Encounter Medications as of 08/01/2023  Medication Sig   aspirin 81 MG EC tablet Take 81 mg by mouth daily.   hydrocortisone 2.5 % cream Apply topically 2 (two) times daily.   hydrocortisone-pramoxine (ANALPRAM HC) 2.5-1 % rectal cream Place 1 application rectally 2 (two) times daily as needed for hemorrhoids or anal itching.   rosuvastatin (CRESTOR) 40 MG tablet Take 1 tablet (40 mg total) by mouth daily.   tadalafil (CIALIS) 20 MG tablet Take 1 tablet (20 mg total) by mouth every 3 (three) days as needed for erectile dysfunction.   [DISCONTINUED] tadalafil (CIALIS) 20 MG tablet TAKE 1 TABLET (20 MG TOTAL) BY MOUTH EVERY 3 (THREE) DAYS AS NEEDED FOR ERECTILE DYSFUNCTION.   No facility-administered encounter medications on file as of 08/01/2023.     Lab Results  Component Value Date   WBC 5.5 02/01/2023   HGB 14.8 02/01/2023   HCT 45.3 02/01/2023   PLT 243.0 02/01/2023   GLUCOSE 97 02/01/2023   CHOL 129 02/01/2023   TRIG 79.0 02/01/2023   HDL 35.10 (L) 02/01/2023   LDLCALC 78 02/01/2023   ALT 32 02/01/2023   AST 24 02/01/2023   NA 137 02/01/2023   K 4.6 02/01/2023   CL 103 02/01/2023   CREATININE 0.89 02/01/2023   BUN 20 02/01/2023   CO2 29 02/01/2023   TSH 3.80 07/29/2022   PSA 2.00 02/01/2023   HGBA1C 5.7 01/27/2022    DG Shoulder Right Result Date: 02/18/2016 CLINICAL DATA:  Pain.  Moving injury. EXAM: RIGHT SHOULDER - 2+ VIEW COMPARISON:  No prior. FINDINGS: Glenohumeral degenerative change. No evidence of fracture, dislocation, or separation. Apical pleural thickening noted consistent scarring. Punctate bony density noted in the right humeral head, most likely a bone island. IMPRESSION: Glenohumeral  degenerative change.  No acute abnormality. Electronically Signed   By: Maisie Fus  Register   On: 02/18/2016 14:17       Assessment & Plan:  Hypercholesterolemia Assessment & Plan: Continue crestor. Low cholesterol diet and exercise. Follow lipid panel and liver function tests.   Orders: -     Basic metabolic panel -     Hepatic function panel -     Lipid panel  Aortic aneurysm without rupture, unspecified portion of  aorta [facility]) Assessment & Plan: Saw cardiology 05/11/23 - s/p PCI to LAD in 12/2015. Felt stable. Recommended continuing crestor and daily aspirin. Also recommended annual echo to follow thoracic aortic aneurysm.   Coronary artery disease involving native coronary artery of native heart without angina pectoris Assessment & Plan: S/p stent LAD x 2.  Continue risk factor modification.  Continue f/u with cardiology.  Continue crestor. No chest pain. Doing well. Back to exercising.    Erectile dysfunction, unspecified erectile dysfunction type Assessment & Plan: Continue cialis.    Hemorrhoids, unspecified hemorrhoid type Assessment & Plan: No symptoms currently.    History of colonic polyps Assessment & Plan: Colonoscopy 07/2015.  Recommended f/u colonoscopy in 10 years.    History of knee surgery Assessment & Plan: Doing well s/p recent surgery. Back exercising.    Stress Assessment & Plan: Overall reports he is handling things relatively well.    Other orders -     Tadalafil; Take 1 tablet (20 mg total) by mouth every 3 (three) days as needed for erectile dysfunction.  Dispense: 10 tablet; Refill: 1     Dale [place], MD

## 2023-08-02 ENCOUNTER — Encounter: Payer: Self-pay | Admitting: Internal Medicine

## 2023-08-07 ENCOUNTER — Other Ambulatory Visit (HOSPITAL_COMMUNITY): Payer: Self-pay

## 2023-11-29 ENCOUNTER — Telehealth: Payer: Self-pay

## 2023-11-29 NOTE — Telephone Encounter (Signed)
 Pharmacy Patient Advocate Encounter   Received notification from CoverMyMeds that prior authorization for Tadalafil  20MG  tablets is required/requested.   Insurance verification completed.   The patient is insured through [facility] .   Per test claim: PA required; PA submitted to above mentioned insurance via CoverMyMeds Key/confirmation #/EOC [facility] Status is pending

## 2023-12-04 ENCOUNTER — Other Ambulatory Visit (HOSPITAL_COMMUNITY): Payer: Self-pay

## 2023-12-04 NOTE — Telephone Encounter (Signed)
 Pharmacy Patient Advocate Encounter  Received notification from [facility] that Prior Authorization for Tadalafil  20MG  tablets has been We have denied your request because it is for more than the amount your plan covers (quantity limit). We reviewed the information we had. We have partially approved your request for this drug up to the amount your plan covers: 6 tablets per month of Cialis  (tadalafil ) 20 mg.   PA #/Case ID/Reference #: [id number]

## 2023-12-04 NOTE — Telephone Encounter (Signed)
 Pt has been notified that his insurance will only cover 6 tablets per month.

## 2023-12-18 ENCOUNTER — Encounter: Payer: Self-pay | Admitting: Internal Medicine

## 2023-12-18 DIAGNOSIS — S86019A Strain of unspecified Achilles tendon, initial encounter: Secondary | ICD-10-CM | POA: Insufficient documentation

## 2024-01-07 ENCOUNTER — Other Ambulatory Visit: Payer: Self-pay | Admitting: Internal Medicine

## 2024-01-10 NOTE — Therapy (Unsigned)
 OUTPATIENT PHYSICAL THERAPY ANKLE EVALUATION   Patient Name: Jake Wagner MRN: [id number] DOB:27-Mar-1961, 63 y.o., male Today's Date: 01/11/2024  END OF SESSION:  PT End of Session - 01/15/24 0806     Visit Number 1    Number of Visits 21    Date for PT Re-Evaluation 03/21/24    PT Start Time 1520    PT Stop Time 1600    PT Time Calculation (min) 40 min    Equipment Utilized During Treatment --   bilateral axillary crutches, L CAM boot   Activity Tolerance Patient tolerated treatment well    Behavior During Therapy WFL for tasks assessed/performed          Past Medical History:  Diagnosis Date   Anginal pain (HCC)    History of chicken pox    Hypercholesteremia    Past Surgical History:  Procedure Laterality Date   CARDIAC CATHETERIZATION Left 01/07/2016   Procedure: Left Heart Cath and Coronary Angiography;  Surgeon: Cara JONETTA Lovelace, MD;  Location: [facility] INVASIVE CV LAB;  Service: Cardiovascular;  Laterality: Left;   Patient Active Problem List   Diagnosis Date Noted   Achilles tendon tear 12/18/2023   Right knee pain 04/14/2023   History of pneumonia 02/01/2023   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 04/29/2022   History of COVID-19 08/01/2021   Left shoulder pain 07/27/2021   Respiratory illness 07/12/2021   Acute cough 05/20/2021   Erectile dysfunction 01/30/2021   Right ankle pain 03/12/2020   Stress 10/21/2018   Rash 10/21/2018   Blood in semen 10/21/2018   Memory change 01/14/2018   Hemorrhoid 01/14/2018   History of malignant melanoma of skin 06/15/2017   History of melanoma in situ 05/30/2017   Aortic aneurysm (HCC) 10/22/2016   Chronic right shoulder pain 05/18/2016   Diastolic dysfunction 04/27/2016   Dilated aortic root (HCC) 04/27/2016   Adhesive capsulitis of right shoulder 03/16/2016   Light headedness 02/21/2016   Right shoulder pain 02/18/2016   Fatigue 10/14/2015   CAD (coronary artery disease) 10/14/2015   History of colonic  polyps 08/19/2015   Hypercholesterolemia 06/22/2015   History of knee surgery 06/22/2015   Health care maintenance 06/22/2015    PCP: Allena Hamilton, MD  REFERRING PROVIDER: Kaitlin M Picone, PA-C  REFERRING DIAG: [phone] (ICD-10-CM) - Strain of left Achilles tendon, initial encounter   Rationale for Evaluation and Treatment: Habilitation  THERAPY DIAG: Pain in left ankle and joints of left foot  Difficulty in walking, not elsewhere classified  Muscle weakness (generalized)  ONSET DATE: L Achilles tendon repair (DOS 12/13/23)   FOLLOW-UP APPT SCHEDULED WITH REFERRING PROVIDER: Yes ; in 3 weeks, next f/u with surgeon's office   SUBJECTIVE:  SUBJECTIVE STATEMENT:  Pt is a 63 year old male s/p L Achilles tendon repair (DOS: 12/13/23).  PERTINENT HISTORY: Pt is a 63 year old male s/p L Achilles tendon repair (DOS: 12/13/23). Patient reports no post-op complications. Patient reports some tenderness along Achilles tendon region. Pt has upstairs bedroom and scoots on bottom to negotiate steps.   PAIN:    Pain Intensity: Present: 0/10, Worst: 0/10 Pain location: Achilles tendon  Radiating: No  Swelling: Yes ; minor swelling on ankle Numbness/Tingling: Yes; seldom tingling in foot  History of prior back, hip, knee, or ankle injury, pain, surgery, or therapy: Yes; L ankle dislocation in 8th grade, no other significant Hx  Imaging: No  Typical footwear:  Red flags: Negative for personal history of cancer, chills/fever, night sweats, nausea, vomiting, unrelenting pain): Negative  PRECAUTIONS: Other: Weightbearing restriction and no DF beyond neutral   WEIGHT BEARING RESTRICTIONS: Yes 50 lbs PWB, increase 25 lbs each successive week starting 01/11/24  FALLS: Has patient fallen in last 6 months? No  Living  Environment Lives with: lives with their spouse and brother-in-law Lives in: House/apartment Stairs: Yes: Internal: 16 steps; on left going up and External: 3 steps; on right going up (going into garage) Has following equipment at home: Crutches, knee scooter   Prior level of function: Independent  Occupational demands: Photography business  Hobbies: Working on yard, landscape  Patient Goals: Return to baseline    OBJECTIVE:   Patient Surveys  LEFS: 21/80 = 26.3%  Cognition Patient is oriented to person, place, and time.  Recent memory is intact.  Remote memory is intact.  Attention span and concentration are intact.  Expressive speech is intact.  Patient's fund of knowledge is within normal limits for educational level.    Gross Musculoskeletal Assessment Bulk: Normal Tone: Normal Moderate ankle edema, mild erythema along incision and ankle. No sign of acute infection or delayed healing.   GAIT: Distance walked: 30 ft Assistive device utilized: Bilateral axillary crutches Level of assistance: SBA Comments: PWB LLE in bilateral axillary crutches in CAM boot only, proper use of upper limb support to offload surgical LE   AROM AROM (Normal range in degrees) AROM  01/11/24      Knee    Flexion (135) WNL WNL  Extension (0) WNL WNL      Ankle    Dorsiflexion (20)  -15  Plantarflexion (50)  40  Inversion (35)  20  Eversion (15)  WNL  (* = pain; Blank rows = not tested)   LE MMT: MMT (out of 5) Right 01/11/24 Left 01/11/24  Hip flexion 4- 4  Hip extension    Hip abduction 4+ 4+  Hip adduction    Hip internal rotation    Hip external rotation    Knee flexion 5 5  Knee extension 5 5  Ankle dorsiflexion 5 3+  Ankle plantarflexion 5 2  Ankle inversion 5 3+  Ankle eversion 5 3+  (* = pain; Blank rows = not tested)  Sensation Grossly intact to light touch throughout bilateral LEs as determined by testing dermatomes L2-S2. Proprioception, stereognosis, and  hot/cold testing deferred on this date.  Reflexes Deferred  Palpation Mid-substance Achilles 1, gastroc-Achilles musculotendinous junction 1, insertional Achilles 0, calcaneal tuberosity 0 Graded on 0-4 scale (0 = no pain, 1 = pain, 2 = pain with wincing/grimacing/flinching, 3 = pain with withdrawal, 4 = unwilling to allow palpation), (Blank rows = not tested)  Passive Accessory Motion Deferred  VASCULAR Dorsalis pedis and posterior tibial pulses are  palpable Capillary refill WNL      TODAY'S TREATMENT: DATE: 01/11/24   Therapeutic Exercise - for HEP establishment, discussion on appropriate exercise/activity modification, PT education   Reviewed baseline home exercises and provided handout for Med[employer] program (see Access Code); tactile cueing and therapist demonstration utilized as needed for carryover of proper technique to HEP.     Patient education on current condition, role of PT, prognosis, plan of care. Discussion on activity modification to prevent flare-up of condition, including review of current post-op restrictions per protocol/surgeon.      PATIENT EDUCATION:  Education details: see above for patient education details Person educated: Patient Education method: Explanation, Demonstration, and Handouts Education comprehension: verbalized understanding and returned demonstration   HOME EXERCISE PROGRAM:  Access Code: [email or web address] URL: [email or web address] Date: 01/11/2024 Prepared by: Venetia Endo  Exercises - Supine Ankle Pumps  - 2 x daily - 7 x weekly - 2 sets - 10 reps - Supine Ankle Inversion Eversion AROM  - 2 x daily - 7 x weekly - 2 sets - 10 reps - Towel Scrunches  - 2 x daily - 7 x weekly - 1 sets - 1-2 min hold   ASSESSMENT:  CLINICAL IMPRESSION: Patient is a 63 y.o. male who was seen today for physical therapy evaluation and treatment s/p L Achilles tendon repair 12/13/23 with routine healing. Pt has been compliant with ROM and  weightbearing restrictions. Pt to not dorsiflex beyond neutral until 6 weeks post-op; 25 lbs PWB on LLE in CAM boot until today; progressing 25 lbs per week starting with 50 lbs on date of evaluation. Patient has expected post-operative deficits in ankle ROM, ankle strength, post-op edema, gait changes. Pt will continue to benefit from skilled PT services to address deficits and improve function.   OBJECTIVE IMPAIRMENTS: Abnormal gait, decreased activity tolerance, decreased balance, decreased mobility, difficulty walking, decreased ROM, decreased strength, hypomobility, increased edema, impaired flexibility, and pain.   ACTIVITY LIMITATIONS: lifting, standing, squatting, stairs, transfers, and locomotion level  PARTICIPATION LIMITATIONS: driving, community activity, yard work, and Aeronautical engineer  PERSONAL FACTORS: 3 comorbidities (CVD/CAD, anxiety, dyslipidemia) are also affecting patient's functional outcome.   REHAB POTENTIAL: Good  CLINICAL DECISION MAKING: Evolving/moderate complexity  EVALUATION COMPLEXITY: Moderate   GOALS: Goals reviewed with patient? Yes  SHORT TERM GOALS: Target date: 02/01/2024  Pt will be independent with HEP to improve strength and decrease ankle pain to improve pain-free function at home and work. Baseline: 01/11/24: Baseline home exercises reviewed.  Goal status: INITIAL  Pt will attain dorsiflexion to 10 degrees or greater by 8 weeks as needed for normalized gait pattern Baseline: 01/11/24: -15 deg DF at baseline/eval.  Goal status: INITIAL   LONG TERM GOALS: Target date: 05/13/2024  Pt will ambulate with normalized gait pattern for 660 feet or greater without reproduction of ankle pain and without AD or boot indicative of ability to perform normal community-level ambulation Baseline: 01/11/24: Ambulating home-level distance with bilateral crutches and CAM boot, partial weightbearing 25 lbs.  Goal status: INITIAL  2.  Pt will negotiate full flight of  steps with reciprocal pattern and no major deviations or pain as needed for negotiating 2-story home with upstairs primary bedroom.  Baseline: 01/11/24: Pt has to scoot on stairs and use knee scooter at top/bottom of steps.  Goal status: INITIAL  3.  Pt will increase LEFS score to 60/80 or greater in order demonstrate clinically significant improvement in ankle pain/level of function.       Baseline: 01/11/24: 21/80 =  26.3% Goal status: INITIAL  4.  Pt will increase strength of tested ankle musculature to 4+/5 or greater MMT grade in order to demonstrate improvement in strength and function  Baseline: 01/11/24:  Goal status: INITIAL   PLAN: PT FREQUENCY: 1-2x/week  PT DURATION: 16 weeks  PLANNED INTERVENTIONS: Therapeutic exercises, Therapeutic activity, Neuromuscular re-education, Balance training, Gait training, Patient/Family education, Self Care, Joint mobilization, Joint manipulation, Vestibular training, Canalith repositioning, Orthotic/Fit training, DME instructions, Dry Needling, Electrical stimulation, Spinal manipulation, Spinal mobilization, Cryotherapy, Moist heat, Taping, Traction, Ultrasound, Ionotophoresis 4mg /ml Dexamethasone, Manual therapy, and Re-evaluation.  PLAN FOR NEXT SESSION: Progress ankle ROM gradually, no DF > neutral until 6 weeks post-op (01/24/24); progress WB 25 lbs per week. Boot wean once WBAT per protocol (2 week boot wean progression). OKC Ankle Tband exercise, no active DF beyond neutral.    Venetia Endo, PT, DPT #[id number]  Venetia ONEIDA Endo, PT 01/15/2024, 8:07 AM

## 2024-01-11 ENCOUNTER — Ambulatory Visit: Attending: Orthopedic Surgery | Admitting: Physical Therapy

## 2024-01-11 DIAGNOSIS — M25572 Pain in left ankle and joints of left foot: Secondary | ICD-10-CM | POA: Insufficient documentation

## 2024-01-11 DIAGNOSIS — M6281 Muscle weakness (generalized): Secondary | ICD-10-CM | POA: Insufficient documentation

## 2024-01-11 DIAGNOSIS — R262 Difficulty in walking, not elsewhere classified: Secondary | ICD-10-CM | POA: Diagnosis present

## 2024-01-15 ENCOUNTER — Encounter: Payer: Self-pay | Admitting: Physical Therapy

## 2024-01-15 ENCOUNTER — Ambulatory Visit: Admitting: Physical Therapy

## 2024-01-15 DIAGNOSIS — R262 Difficulty in walking, not elsewhere classified: Secondary | ICD-10-CM

## 2024-01-15 DIAGNOSIS — M25572 Pain in left ankle and joints of left foot: Secondary | ICD-10-CM

## 2024-01-15 DIAGNOSIS — M6281 Muscle weakness (generalized): Secondary | ICD-10-CM

## 2024-01-15 NOTE — Therapy (Unsigned)
 OUTPATIENT PHYSICAL THERAPY ANKLE POST-OP TREATMENT   Patient Name: Jake Wagner MRN: [id number] DOB:16-Sep-1960, 63 y.o., male Today's Date: 01/15/2024  END OF SESSION:  PT End of Session - 01/15/24 1457     Visit Number 2    Number of Visits 21    Date for PT Re-Evaluation 03/21/24    PT Start Time 1458    PT Stop Time 1538    PT Time Calculation (min) 40 min    Equipment Utilized During Treatment --   bilateral axillary crutches, L CAM boot   Activity Tolerance Patient tolerated treatment well    Behavior During Therapy WFL for tasks assessed/performed           Past Medical History:  Diagnosis Date   Anginal pain (HCC)    History of chicken pox    Hypercholesteremia    Past Surgical History:  Procedure Laterality Date   CARDIAC CATHETERIZATION Left 01/07/2016   Procedure: Left Heart Cath and Coronary Angiography;  Surgeon: Cara JONETTA Lovelace, MD;  Location: [facility] INVASIVE CV LAB;  Service: Cardiovascular;  Laterality: Left;   Patient Active Problem List   Diagnosis Date Noted   Achilles tendon tear 12/18/2023   Right knee pain 04/14/2023   History of pneumonia 02/01/2023   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 04/29/2022   History of COVID-19 08/01/2021   Left shoulder pain 07/27/2021   Respiratory illness 07/12/2021   Acute cough 05/20/2021   Erectile dysfunction 01/30/2021   Right ankle pain 03/12/2020   Stress 10/21/2018   Rash 10/21/2018   Blood in semen 10/21/2018   Memory change 01/14/2018   Hemorrhoid 01/14/2018   History of malignant melanoma of skin 06/15/2017   History of melanoma in situ 05/30/2017   Aortic aneurysm (HCC) 10/22/2016   Chronic right shoulder pain 05/18/2016   Diastolic dysfunction 04/27/2016   Dilated aortic root (HCC) 04/27/2016   Adhesive capsulitis of right shoulder 03/16/2016   Light headedness 02/21/2016   Right shoulder pain 02/18/2016   Fatigue 10/14/2015   CAD (coronary artery disease) 10/14/2015   History of  colonic polyps 08/19/2015   Hypercholesterolemia 06/22/2015   History of knee surgery 06/22/2015   Health care maintenance 06/22/2015    PCP: Allena Hamilton, MD  REFERRING PROVIDER: Kaitlin M Picone, PA-C  REFERRING DIAG: [phone] (ICD-10-CM) - Strain of left Achilles tendon, initial encounter   Rationale for Evaluation and Treatment: Habilitation  THERAPY DIAG: Pain in left ankle and joints of left foot  Difficulty in walking, not elsewhere classified  Muscle weakness (generalized)  ONSET DATE: L Achilles tendon repair (DOS 12/13/23)   FOLLOW-UP APPT SCHEDULED WITH REFERRING PROVIDER: Yes ; in 3 weeks from initial eval, next f/u with surgeon's office  PERTINENT HISTORY: Pt is a 63 year old male s/p L Achilles tendon repair (DOS: 12/13/23). Patient reports no post-op complications. Patient reports some tenderness along Achilles tendon region. Pt has upstairs bedroom and scoots on bottom to negotiate steps.   PAIN:    Pain Intensity: Present: 0/10, Worst: 0/10 Pain location: Achilles tendon  Radiating: No  Swelling: Yes ; minor swelling on ankle Numbness/Tingling: Yes; seldom tingling in foot  History of prior back, hip, knee, or ankle injury, pain, surgery, or therapy: Yes; L ankle dislocation in 8th grade, no other significant Hx  Imaging: No  Typical footwear:  Red flags: Negative for personal history of cancer, chills/fever, night sweats, nausea, vomiting, unrelenting pain): Negative  PRECAUTIONS: Other: Weightbearing restriction and no DF beyond neutral   WEIGHT  BEARING RESTRICTIONS: Yes 50 lbs PWB, increase 25 lbs each successive week starting 01/11/24  FALLS: Has patient fallen in last 6 months? 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 No sign of acute infection or delayed healing.   GAIT: Distance walked: 30 ft Assistive device utilized: Bilateral axillary crutches Level of assistance: SBA Comments: PWB LLE in bilateral axillary crutches in CAM boot only, proper use of upper limb support to offload surgical LE   AROM AROM (Normal range in degrees) AROM  01/11/24      Knee    Flexion (135) WNL WNL  Extension (0) WNL WNL      Ankle    Dorsiflexion (20)  -15  Plantarflexion (50)  40  Inversion (35)  20  Eversion (15)  WNL  (* = pain; Blank rows = not tested)   LE MMT: MMT (out of 5) Right 01/11/24 Left 01/11/24  Hip flexion 4- 4  Hip extension    Hip abduction 4+ 4+  Hip adduction    Hip internal rotation    Hip external rotation    Knee flexion 5 5  Knee extension 5 5  Ankle dorsiflexion 5 3+  Ankle plantarflexion 5 2  Ankle inversion 5 3+  Ankle eversion 5 3+  (* = pain; Blank rows = not tested)  Sensation Grossly intact to light touch throughout bilateral LEs as determined by testing dermatomes L2-S2. Proprioception, stereognosis, and hot/cold testing deferred on this date.  Reflexes Deferred  Palpation Mid-substance Achilles 1, gastroc-Achilles musculotendinous junction 1, insertional Achilles 0, calcaneal tuberosity 0 Graded on 0-4 scale (0 = no pain, 1 = pain, 2 = pain with wincing/grimacing/flinching, 3 = pain with withdrawal, 4 = unwilling to allow palpation), (Blank rows = not tested)  Passive Accessory Motion Deferred  VASCULAR Dorsalis pedis and posterior tibial pulses are  palpable Capillary refill WNL      TODAY'S TREATMENT: DATE: 01/15/2024    SUBJECTIVE STATEMENT:   Pt reports tightness along Achilles mainly and some soreness along incision. He reports no major pain. Pt is compliant with HEP and current post-op restrictions.     Manual Therapy - joint mobility, ROM; prevention of motion loss during period of immobilization  Pt reclined: L ankle PROM within pt tolerance, no DF > 0 deg; x 5 min  PROM measurements taken; PF 45, DF to -1 Midfoot ER/IR PROM; x 10 reps Subtalar inversion/eversion PROM; x 10 reps   Quick incision check: scar is largely closed with only mild scabbing at superior portion of operative incision; no active drainage, bleeding, or erythema   Therapeutic Exercise - for improved soft tissue flexibility and extensibility as needed for ROM, improved strength as needed to improve performance of CKC activities/functional movements   Ankle pump; PT guarding for DF ROM; x20 in PF and DF  Seated ankle inversion/eversion with towel; x 20  Seated towel scrunch; x 1 min   Reclined ankle  Tband; INV/PF/EV; x 25 ea dir with Green TBand  -PT holding band for INV  PATIENT EDUCATION: Discussed addition of theraband isotonics following next visit pending good tolerance of exercises and no increase in pain/swelling. We reiterated expected progression of weightbearing in CAM boot per physician's instructions and allowance for further dorsiflexion ROM after 6 weeks post-op.    PATIENT EDUCATION:  Education details: see above for patient education details Person educated: Patient Education method: Explanation, Demonstration, and Handouts Education comprehension: verbalized understanding and returned demonstration   HOME EXERCISE PROGRAM:  Access Code: [email or web address] URL: [email or web address] Date: 01/11/2024 Prepared by: Venetia Endo  Exercises - Supine Ankle Pumps  - 2 x daily - 7 x weekly - 2 sets - 10 reps - Supine  Ankle Inversion Eversion AROM  - 2 x daily - 7 x weekly - 2 sets - 10 reps - Towel Scrunches  - 2 x daily - 7 x weekly - 1 sets - 1-2 min hold   ASSESSMENT:  CLINICAL IMPRESSION: Patient fortunately has minimal to no pain at this time and is able to attain active and passive dorsiflexion up to current limit of protocol without notable pain, only mild tightness along region of operative incision. Pt is following weightbearing restrictions and is cognizant of weekly 25-lb increase in weightbearing per surgeon's instructions. We worked on gentle ankle ROM in reclined and seated position and Tband isotonics with medium-tension band (no resistance into dorsiflexion; only PF/INV/EV) with no report of pain. Pt tolerates initial session well in early-phase rehab. Patient has expected post-operative deficits in ankle ROM, ankle strength, post-op edema, gait changes. Pt will continue to benefit from skilled PT services to address deficits and improve function.   OBJECTIVE IMPAIRMENTS: Abnormal gait, decreased activity tolerance, decreased balance, decreased mobility, difficulty walking, decreased ROM, decreased strength, hypomobility, increased edema, impaired flexibility, and pain.   ACTIVITY LIMITATIONS: lifting, standing, squatting, stairs, transfers, and locomotion level  PARTICIPATION LIMITATIONS: driving, community activity, yard work, and Aeronautical engineer  PERSONAL FACTORS: 3 comorbidities (CVD/CAD, anxiety, dyslipidemia) are also affecting patient's functional outcome.   REHAB POTENTIAL: Good  CLINICAL DECISION MAKING: Evolving/moderate complexity  EVALUATION COMPLEXITY: Moderate   GOALS: Goals reviewed with patient? Yes  SHORT TERM GOALS: Target date: 02/01/2024  Pt will be independent with HEP to improve strength and decrease ankle pain to improve pain-free function at home and work. Baseline: 01/11/24: Baseline home exercises reviewed.  Goal status: INITIAL  Pt will attain dorsiflexion  to 10 degrees or greater by 8 weeks as needed for normalized gait pattern Baseline: 01/11/24: -15 deg DF at baseline/eval.  Goal status: INITIAL   LONG TERM GOALS: Target date: 05/13/2024  Pt will ambulate with normalized gait pattern for 660 feet or greater without reproduction of ankle pain and without AD or boot indicative of ability to perform normal community-level ambulation Baseline: 01/11/24: Ambulating home-level distance with bilateral crutches and CAM boot, partial weightbearing 25 lbs.  Goal status: INITIAL  2.  Pt will negotiate full flight of steps with reciprocal pattern and no major deviations or pain as needed for negotiating 2-story home with upstairs primary bedroom.  Baseline: 01/11/24: Pt has to scoot on stairs and use knee scooter at top/bottom of steps.  Goal status: INITIAL  3.  Pt will increase LEFS score to 60/80 or greater in order demonstrate clinically significant improvement in ankle pain/level of function.       Baseline: 01/11/24: 21/80 = 26.3% Goal status: INITIAL  4.  Pt will increase strength of tested  ankle musculature to 4+/5 or greater MMT grade in order to demonstrate improvement in strength and function  Baseline: 01/11/24:  Goal status: INITIAL   PLAN: PT FREQUENCY: 1-2x/week  PT DURATION: 16 weeks  PLANNED INTERVENTIONS: Therapeutic exercises, Therapeutic activity, Neuromuscular re-education, Balance training, Gait training, Patient/Family education, Self Care, Joint mobilization, Joint manipulation, Vestibular training, Canalith repositioning, Orthotic/Fit training, DME instructions, Dry Needling, Electrical stimulation, Spinal manipulation, Spinal mobilization, Cryotherapy, Moist heat, Taping, Traction, Ultrasound, Ionotophoresis 4mg /ml Dexamethasone, Manual therapy, and Re-evaluation.  PLAN FOR NEXT SESSION: Progress ankle ROM gradually, no DF > neutral until 6 weeks post-op (01/24/24); progress WB 25 lbs per week. Boot wean once WBAT per protocol  (2 week boot wean progression). OKC Ankle Tband exercise, no active DF beyond neutral.    Venetia Endo, PT, DPT #[id number]  Venetia ONEIDA Endo, PT 01/15/2024, 2:58 PM

## 2024-01-17 ENCOUNTER — Ambulatory Visit: Admitting: Physical Therapy

## 2024-01-17 ENCOUNTER — Encounter: Payer: Self-pay | Admitting: Physical Therapy

## 2024-01-17 DIAGNOSIS — M25572 Pain in left ankle and joints of left foot: Secondary | ICD-10-CM | POA: Diagnosis not present

## 2024-01-17 DIAGNOSIS — R262 Difficulty in walking, not elsewhere classified: Secondary | ICD-10-CM

## 2024-01-17 DIAGNOSIS — M6281 Muscle weakness (generalized): Secondary | ICD-10-CM

## 2024-01-17 NOTE — Therapy (Signed)
 OUTPATIENT PHYSICAL THERAPY ANKLE POST-OP TREATMENT   Patient Name: Jake Wagner MRN: [id number] DOB:09-05-1960, 63 y.o., male Today's Date: 01/17/2024  END OF SESSION:  PT End of Session - 01/17/24 1621     Visit Number 3    Number of Visits 21    Date for PT Re-Evaluation 03/21/24    PT Start Time 1551    PT Stop Time 1625    PT Time Calculation (min) 34 min    Equipment Utilized During Treatment --   bilateral axillary crutches, L CAM boot   Activity Tolerance Patient tolerated treatment well    Behavior During Therapy WFL for tasks assessed/performed            Past Medical History:  Diagnosis Date   Anginal pain (HCC)    History of chicken pox    Hypercholesteremia    Past Surgical History:  Procedure Laterality Date   CARDIAC CATHETERIZATION Left 01/07/2016   Procedure: Left Heart Cath and Coronary Angiography;  Surgeon: Cara JONETTA Lovelace, MD;  Location: [facility];  Service: Cardiovascular;  Laterality: Left;   Patient Active Problem List   Diagnosis Date Noted   Achilles tendon tear 12/18/2023   Right knee pain 04/14/2023   History of pneumonia 02/01/2023   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 04/29/2022   History of COVID-19 08/01/2021   Left shoulder pain 07/27/2021   Respiratory illness 07/12/2021   Acute cough 05/20/2021   Erectile dysfunction 01/30/2021   Right ankle pain 03/12/2020   Stress 10/21/2018   Rash 10/21/2018   Blood in semen 10/21/2018   Memory change 01/14/2018   Hemorrhoid 01/14/2018   History of malignant melanoma of skin 06/15/2017   History of melanoma in situ 05/30/2017   Aortic aneurysm (HCC) 10/22/2016   Chronic right shoulder pain 05/18/2016   Diastolic dysfunction 04/27/2016   Dilated aortic root (HCC) 04/27/2016   Adhesive capsulitis of right shoulder 03/16/2016   Light headedness 02/21/2016   Right shoulder pain 02/18/2016   Fatigue 10/14/2015   CAD (coronary artery disease) 10/14/2015   History of  colonic polyps 08/19/2015   Hypercholesterolemia 06/22/2015   History of knee surgery 06/22/2015   Health care maintenance 06/22/2015    PCP: Allena Hamilton, MD  REFERRING PROVIDER: Kaitlin M Picone, PA-C  REFERRING DIAG: [phone] (ICD-10-CM) - Strain of left Achilles tendon, initial encounter   Rationale for Evaluation and Treatment: Habilitation  THERAPY DIAG: Pain in left ankle and joints of left foot  Difficulty in walking, not elsewhere classified  Muscle weakness (generalized)  ONSET DATE: L Achilles tendon repair (DOS 12/13/23)   FOLLOW-UP APPT SCHEDULED WITH REFERRING PROVIDER: Yes ; in 3 weeks from initial eval, next f/u with surgeon's office  PERTINENT HISTORY: Pt is a 63 year old male s/p L Achilles tendon repair (DOS: 12/13/23). Patient reports no post-op complications. Patient reports some tenderness along Achilles tendon region. Pt has upstairs bedroom and scoots on bottom to negotiate steps.   PAIN:    Pain Intensity: Present: 0/10, Worst: 0/10 Pain location: Achilles tendon  Radiating: No  Swelling: Yes ; minor swelling on ankle Numbness/Tingling: Yes; seldom tingling in foot  History of prior back, hip, knee, or ankle injury, pain, surgery, or therapy: Yes; L ankle dislocation in 8th grade, no other significant Hx  Imaging: No  Typical footwear:  Red flags: Negative for personal history of cancer, chills/fever, night sweats, nausea, vomiting, unrelenting pain): Negative  PRECAUTIONS: Other: Weightbearing restriction and no DF beyond neutral  WEIGHT BEARING RESTRICTIONS: Yes 50 lbs PWB, increase 25 lbs each successive week starting 01/11/24  FALLS: Has patient fallen in last 6 months? 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 No sign of acute infection or delayed healing.   GAIT: Distance walked: 30 ft Assistive device utilized: Bilateral axillary crutches Level of assistance: SBA Comments: PWB LLE in bilateral axillary crutches in CAM boot only, proper use of upper limb support to offload surgical LE   AROM AROM (Normal range in degrees) AROM  01/11/24      Knee    Flexion (135) WNL WNL  Extension (0) WNL WNL      Ankle    Dorsiflexion (20)  -15  Plantarflexion (50)  40  Inversion (35)  20  Eversion (15)  WNL  (* = pain; Blank rows = not tested)   LE MMT: MMT (out of 5) Right 01/11/24 Left 01/11/24  Hip flexion 4- 4  Hip extension    Hip abduction 4+ 4+  Hip adduction    Hip internal rotation    Hip external rotation    Knee flexion 5 5  Knee extension 5 5  Ankle dorsiflexion 5 3+  Ankle plantarflexion 5 2  Ankle inversion 5 3+  Ankle eversion 5 3+  (* = pain; Blank rows = not tested)  Sensation Grossly intact to light touch throughout bilateral LEs as determined by testing dermatomes L2-S2. Proprioception, stereognosis, and hot/cold testing deferred on this date.  Reflexes Deferred  Palpation Mid-substance Achilles 1, gastroc-Achilles musculotendinous junction 1, insertional Achilles 0, calcaneal tuberosity 0 Graded on 0-4 scale (0 = no pain, 1 = pain, 2 = pain with wincing/grimacing/flinching, 3 = pain with withdrawal, 4 = unwilling to allow palpation), (Blank rows = not tested)  Passive Accessory Motion Deferred  VASCULAR Dorsalis pedis and posterior tibial pulses are  palpable Capillary refill WNL      TODAY'S TREATMENT: DATE: 01/17/2024    SUBJECTIVE STATEMENT:   Pt reports no major changes since earlier this week. No notable post-exercise soreness after last visit. Pt denies new complaints and reports compliance with post-op restrictions.   6 weeks post-op 01/24/24 Next f/u with surgeon's office Thur 01/25/24   Manual Therapy - joint mobility, ROM; prevention of motion loss during period of immobilization  Pt reclined: L ankle PROM within pt tolerance, no DF > 0 deg; x 5 min  PROM measurements taken; PF 45, DF to -1 Midfoot ER/IR PROM; x 10 reps Subtalar inversion/eversion PROM; x 10 reps   Quick incision check: scar is largely closed with only mild scabbing at superior portion of operative incision; no active drainage, bleeding, or erythema   Therapeutic Exercise - for improved soft tissue flexibility and extensibility as needed for ROM, improved strength as needed to improve performance of CKC activities/functional movements   Ankle pump; PT guarding for DF ROM; x20 in PF and DF  Seated ankle inversion/eversion with towel;  x 20  Seated towel scrunch; 2 x 1 min, with added 1-lb Dbell on towel   Reclined ankle Tband; INV/PF/EV; x 25 ea dir with Green TBand  -PT holding band for INV  Active straight leg raise; 2 x 10  PATIENT EDUCATION: HEP updated today to include Tband isotonics.    PATIENT EDUCATION:  Education details: see above for patient education details Person educated: Patient Education method: Explanation, Demonstration, and Handouts Education comprehension: verbalized understanding and returned demonstration   HOME EXERCISE PROGRAM:  Access Code: [email or web address] URL: [email or web address] Date: 01/17/2024 Prepared by: Venetia Endo  Exercises - Supine Ankle Inversion Eversion AROM  - 2 x daily - 7 x weekly - 2 sets - 10 reps - Towel Scrunches  - 2 x daily - 7 x weekly - 1 sets - 1-2 min hold - Long  Sitting Ankle Plantar Flexion with Resistance  - 1 x daily - 7 x weekly - 2 sets - 10 reps - Long Sitting Ankle Eversion with Resistance  - 1 x daily - 7 x weekly - 2 sets - 10 reps - Ankle Inversion with Resistance  - 1 x daily - 7 x weekly - 2 sets - 10 reps - Active Straight Leg Raise with Quad Set  - 1 x daily - 7 x weekly - 2 sets - 10 reps   ASSESSMENT:  CLINICAL IMPRESSION: Patient has made good progress with attaining DF to neutral (current limit of post-op protocol to protect Achilles tendon). We removed one layer of heel lift from boot today and pt is continuing with 50-lbs partial weightbearing with bilat axillary crutches. HEP was updated to include light Tband isotonics and SLR. Next Wednesday, pt may progress into 6-10 week guidelines per protocol. Patient has expected post-operative deficits in ankle ROM, ankle strength, post-op edema, gait changes. Pt will continue to benefit from skilled PT services to address deficits and improve function.   OBJECTIVE IMPAIRMENTS: Abnormal gait, decreased activity tolerance, decreased balance, decreased mobility, difficulty walking, decreased ROM, decreased strength, hypomobility, increased edema, impaired flexibility, and pain.   ACTIVITY LIMITATIONS: lifting, standing, squatting, stairs, transfers, and locomotion level  PARTICIPATION LIMITATIONS: driving, community activity, yard work, and Aeronautical engineer  PERSONAL FACTORS: 3 comorbidities (CVD/CAD, anxiety, dyslipidemia) are also affecting patient's functional outcome.   REHAB POTENTIAL: Good  CLINICAL DECISION MAKING: Evolving/moderate complexity  EVALUATION COMPLEXITY: Moderate   GOALS: Goals reviewed with patient? Yes  SHORT TERM GOALS: Target date: 02/01/2024  Pt will be independent with HEP to improve strength and decrease ankle pain to improve pain-free function at home and work. Baseline: 01/11/24: Baseline home exercises reviewed.  Goal status: INITIAL  Pt will attain  dorsiflexion to 10 degrees or greater by 8 weeks as needed for normalized gait pattern Baseline: 01/11/24: -15 deg DF at baseline/eval.  Goal status: INITIAL   LONG TERM GOALS: Target date: 05/13/2024  Pt will ambulate with normalized gait pattern for 660 feet or greater without reproduction of ankle pain and without AD or boot indicative of ability to perform normal community-level ambulation Baseline: 01/11/24: Ambulating home-level distance with bilateral crutches and CAM boot, partial weightbearing 25 lbs.  Goal status: INITIAL  2.  Pt will negotiate full flight of steps with reciprocal pattern and no major deviations or pain as needed for negotiating 2-story home with upstairs primary bedroom.  Baseline: 01/11/24: Pt has to scoot on stairs and use knee scooter at top/bottom of steps.  Goal status: INITIAL  3.  Pt will increase LEFS score to  60/80 or greater in order demonstrate clinically significant improvement in ankle pain/level of function.       Baseline: 01/11/24: 21/80 = 26.3% Goal status: INITIAL  4.  Pt will increase strength of tested ankle musculature to 4+/5 or greater MMT grade in order to demonstrate improvement in strength and function  Baseline: 01/11/24:  Goal status: INITIAL   PLAN: PT FREQUENCY: 1-2x/week  PT DURATION: 16 weeks  PLANNED INTERVENTIONS: Therapeutic exercises, Therapeutic activity, Neuromuscular re-education, Balance training, Gait training, Patient/Family education, Self Care, Joint mobilization, Joint manipulation, Vestibular training, Canalith repositioning, Orthotic/Fit training, DME instructions, Dry Needling, Electrical stimulation, Spinal manipulation, Spinal mobilization, Cryotherapy, Moist heat, Taping, Traction, Ultrasound, Ionotophoresis 4mg /ml Dexamethasone, Manual therapy, and Re-evaluation.  PLAN FOR NEXT SESSION: Progress ankle ROM gradually, no DF > neutral until 6 weeks post-op (01/24/24); progress WB 25 lbs per week. Boot wean once WBAT  per protocol (2 week boot wean progression). OKC Ankle Tband exercise, no active DF beyond neutral.    Venetia Endo, PT, DPT #[id number]  Venetia ONEIDA Endo, PT 01/17/2024, 4:22 PM

## 2024-01-22 ENCOUNTER — Ambulatory Visit: Admitting: Physical Therapy

## 2024-01-24 ENCOUNTER — Ambulatory Visit: Admitting: Physical Therapy

## 2024-01-24 DIAGNOSIS — M25572 Pain in left ankle and joints of left foot: Secondary | ICD-10-CM | POA: Diagnosis not present

## 2024-01-24 DIAGNOSIS — M6281 Muscle weakness (generalized): Secondary | ICD-10-CM

## 2024-01-24 DIAGNOSIS — R262 Difficulty in walking, not elsewhere classified: Secondary | ICD-10-CM

## 2024-01-24 NOTE — Therapy (Unsigned)
 OUTPATIENT PHYSICAL THERAPY ANKLE POST-OP TREATMENT   Patient Name: Jake Wagner MRN: [id number] DOB:21-Feb-1961, 63 y.o., male Today's Date: 01/24/2024  END OF SESSION:      Past Medical History:  Diagnosis Date   Anginal pain (HCC)    History of chicken pox    Hypercholesteremia    Past Surgical History:  Procedure Laterality Date   CARDIAC CATHETERIZATION Left 01/07/2016   Procedure: Left Heart Cath and Coronary Angiography;  Surgeon: Cara JONETTA Lovelace, MD;  Location: [facility];  Service: Cardiovascular;  Laterality: Left;   Patient Active Problem List   Diagnosis Date Noted   Achilles tendon tear 12/18/2023   Right knee pain 04/14/2023   History of pneumonia 02/01/2023   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 04/29/2022   History of COVID-19 08/01/2021   Left shoulder pain 07/27/2021   Respiratory illness 07/12/2021   Acute cough 05/20/2021   Erectile dysfunction 01/30/2021   Right ankle pain 03/12/2020   Stress 10/21/2018   Rash 10/21/2018   Blood in semen 10/21/2018   Memory change 01/14/2018   Hemorrhoid 01/14/2018   History of malignant melanoma of skin 06/15/2017   History of melanoma in situ 05/30/2017   Aortic aneurysm (HCC) 10/22/2016   Chronic right shoulder pain 05/18/2016   Diastolic dysfunction 04/27/2016   Dilated aortic root (HCC) 04/27/2016   Adhesive capsulitis of right shoulder 03/16/2016   Light headedness 02/21/2016   Right shoulder pain 02/18/2016   Fatigue 10/14/2015   CAD (coronary artery disease) 10/14/2015   History of colonic polyps 08/19/2015   Hypercholesterolemia 06/22/2015   History of knee surgery 06/22/2015   Health care maintenance 06/22/2015    PCP: Allena Hamilton, MD  REFERRING PROVIDER: Kaitlin M Picone, PA-C  REFERRING DIAG: [phone] (ICD-10-CM) - Strain of left Achilles tendon, initial encounter   Rationale for Evaluation and Treatment: Habilitation  THERAPY DIAG: Pain in left ankle and joints of  left foot  Difficulty in walking, not elsewhere classified  Muscle weakness (generalized)  ONSET DATE: L Achilles tendon repair (DOS 12/13/23)   FOLLOW-UP APPT SCHEDULED WITH REFERRING PROVIDER: Yes ; in 3 weeks from initial eval, next f/u with surgeon's office  PERTINENT HISTORY: Pt is a 63 year old male s/p L Achilles tendon repair (DOS: 12/13/23). Patient reports no post-op complications. Patient reports some tenderness along Achilles tendon region. Pt has upstairs bedroom and scoots on bottom to negotiate steps.   PAIN:    Pain Intensity: Present: 0/10, Worst: 0/10 Pain location: Achilles tendon  Radiating: No  Swelling: Yes ; minor swelling on ankle Numbness/Tingling: Yes; seldom tingling in foot  History of prior back, hip, knee, or ankle injury, pain, surgery, or therapy: Yes; L ankle dislocation in 8th grade, no other significant Hx  Imaging: No  Typical footwear:  Red flags: Negative for personal history of cancer, chills/fever, night sweats, nausea, vomiting, unrelenting pain): Negative  PRECAUTIONS: Other: Weightbearing restriction and no DF beyond neutral   WEIGHT BEARING RESTRICTIONS: Yes 50 lbs PWB, increase 25 lbs each successive week starting 01/11/24  FALLS: Has patient fallen in last 6 months? No  Living Environment Lives with: lives with their spouse and brother-in-law Lives in: House/apartment Stairs: Yes: Internal: 16 steps; on left going up and External: 3 steps; on right going up (going into garage) Has following equipment at home: Crutches, knee scooter   Prior level of function: Independent  Occupational demands: Photography business  Hobbies: Working on yard, landscape  Patient Goals: Return to baseline  OBJECTIVE (data from initial evaluation unless otherwise dated):   Patient Surveys  LEFS: 21/80 = 26.3%   Gross Musculoskeletal Assessment Bulk: Normal Tone: Normal Moderate ankle edema, mild erythema along incision and ankle. No sign of  acute infection or delayed healing.   GAIT: Distance walked: 30 ft Assistive device utilized: Bilateral axillary crutches Level of assistance: SBA Comments: PWB LLE in bilateral axillary crutches in CAM boot only, proper use of upper limb support to offload surgical LE   AROM AROM (Normal range in degrees) AROM  01/11/24      Knee    Flexion (135) WNL WNL  Extension (0) WNL WNL      Ankle    Dorsiflexion (20)  -15  Plantarflexion (50)  40  Inversion (35)  20  Eversion (15)  WNL  (* = pain; Blank rows = not tested)   LE MMT: MMT (out of 5) Right 01/11/24 Left 01/11/24  Hip flexion 4- 4  Hip extension    Hip abduction 4+ 4+  Hip adduction    Hip internal rotation    Hip external rotation    Knee flexion 5 5  Knee extension 5 5  Ankle dorsiflexion 5 3+  Ankle plantarflexion 5 2  Ankle inversion 5 3+  Ankle eversion 5 3+  (* = pain; Blank rows = not tested)  Sensation Grossly intact to light touch throughout bilateral LEs as determined by testing dermatomes L2-S2. Proprioception, stereognosis, and hot/cold testing deferred on this date.  Reflexes Deferred  Palpation Mid-substance Achilles 1, gastroc-Achilles musculotendinous junction 1, insertional Achilles 0, calcaneal tuberosity 0 Graded on 0-4 scale (0 = no pain, 1 = pain, 2 = pain with wincing/grimacing/flinching, 3 = pain with withdrawal, 4 = unwilling to allow palpation), (Blank rows = not tested)  Passive Accessory Motion Deferred  VASCULAR Dorsalis pedis and posterior tibial pulses are palpable Capillary refill WNL      TODAY'S TREATMENT: DATE: 01/24/2024    SUBJECTIVE STATEMENT:   Pt reports ongoing tightness in Achilles region. No notable pain in region. Patient reports compliance with his HEP.   6 weeks post-op 01/24/24 Next f/u with surgeon's office Thur 01/25/24   Manual Therapy - joint mobility, ROM; prevention of motion loss during period of immobilization  Pt reclined: L ankle PROM  within pt tolerance, DF as tolerated, no aggressive stretching; x 5 min  PROM measurements taken; PF WNL, DF 9, INV WNL, EV WNL Midfoot ER/IR PROM; x 10 reps Subtalar inversion/eversion PROM; x 10 reps    Therapeutic Exercise - for improved soft tissue flexibility and extensibility as needed for ROM, improved strength as needed to improve performance of CKC  activities/functional movements   Seated heel slide for DF ROM; x15, 5 sec hold with gentle stretch only  Ankle pump; PT guarding for DF ROM; x20 in PF and DF   Reclined ankle Tband; INV/PF/EV; x 20 ea dir with Blue Tband   -PT holding band for INV    Seated towel scrunch; 2 x 1 min, with added 1-lb Dbell on towel  PATIENT EDUCATION: HEP updated today to include Tband isotonics.    *not today* Seated ankle inversion/eversion with towel; x 20 Active straight leg raise; 2 x 10  PATIENT EDUCATION:  Education details: see above for patient education details Person educated: Patient Education method: Explanation, Demonstration, and Handouts Education comprehension: verbalized understanding and returned demonstration   HOME EXERCISE PROGRAM:  Access Code: [email or web address] URL: [email or web address] Date: 01/17/2024 Prepared by: Venetia Endo  Exercises -  Supine Ankle Inversion Eversion AROM  - 2 x daily - 7 x weekly - 2 sets - 10 reps - Towel Scrunches  - 2 x daily - 7 x weekly - 1 sets - 1-2 min hold - Long Sitting Ankle Plantar Flexion with Resistance  - 1 x daily - 7 x weekly - 2 sets - 10 reps - Long Sitting Ankle Eversion with Resistance  - 1 x daily - 7 x weekly - 2 sets - 10 reps - Ankle Inversion with Resistance  - 1 x daily - 7 x weekly - 2 sets - 10 reps - Active Straight Leg Raise with Quad Set  - 1 x daily - 7 x weekly - 2 sets - 10 reps   ASSESSMENT:  CLINICAL IMPRESSION: Patient has made good progress with attaining DF to neutral (current limit of post-op protocol to protect Achilles tendon). We  removed one layer of heel lift from boot today and pt is continuing with 50-lbs partial weightbearing with bilat axillary crutches. HEP was updated to include light Tband isotonics and SLR. Next Wednesday, pt may progress into 6-10 week guidelines per protocol. Patient has expected post-operative deficits in ankle ROM, ankle strength, post-op edema, gait changes. Pt will continue to benefit from skilled PT services to address deficits and improve function.   OBJECTIVE IMPAIRMENTS: Abnormal gait, decreased activity tolerance, decreased balance, decreased mobility, difficulty walking, decreased ROM, decreased strength, hypomobility, increased edema, impaired flexibility, and pain.   ACTIVITY LIMITATIONS: lifting, standing, squatting, stairs, transfers, and locomotion level  PARTICIPATION LIMITATIONS: driving, community activity, yard work, and Aeronautical engineer  PERSONAL FACTORS: 3 comorbidities (CVD/CAD, anxiety, dyslipidemia) are also affecting patient's functional outcome.   REHAB POTENTIAL: Good  CLINICAL DECISION MAKING: Evolving/moderate complexity  EVALUATION COMPLEXITY: Moderate   GOALS: Goals reviewed with patient? Yes  SHORT TERM GOALS: Target date: 02/01/2024  Pt will be independent with HEP to improve strength and decrease ankle pain to improve pain-free function at home and work. Baseline: 01/11/24: Baseline home exercises reviewed.  Goal status: INITIAL  Pt will attain dorsiflexion to 10 degrees or greater by 8 weeks as needed for normalized gait pattern Baseline: 01/11/24: -15 deg DF at baseline/eval.  Goal status: INITIAL   LONG TERM GOALS: Target date: 05/13/2024  Pt will ambulate with normalized gait pattern for 660 feet or greater without reproduction of ankle pain and without AD or boot indicative of ability to perform normal community-level ambulation Baseline: 01/11/24: Ambulating home-level distance with bilateral crutches and CAM boot, partial weightbearing 25 lbs.   Goal status: INITIAL  2.  Pt will negotiate full flight of steps with reciprocal pattern and no major deviations or pain as needed for negotiating 2-story home with upstairs primary bedroom.  Baseline: 01/11/24: Pt has to scoot on stairs and use knee scooter at top/bottom of steps.  Goal status: INITIAL  3.  Pt will increase LEFS score to 60/80 or greater in order demonstrate clinically significant improvement in ankle pain/level of function.       Baseline: 01/11/24: 21/80 = 26.3% Goal status: INITIAL  4.  Pt will increase strength of tested ankle musculature to 4+/5 or greater MMT grade in order to demonstrate improvement in strength and function  Baseline: 01/11/24:  Goal status: INITIAL   PLAN: PT FREQUENCY: 1-2x/week  PT DURATION: 16 weeks  PLANNED INTERVENTIONS: Therapeutic exercises, Therapeutic activity, Neuromuscular re-education, Balance training, Gait training, Patient/Family education, Self Care, Joint mobilization, Joint manipulation, Vestibular training, Canalith repositioning, Orthotic/Fit training, DME instructions, Dry Needling,  Electrical stimulation, Spinal manipulation, Spinal mobilization, Cryotherapy, Moist heat, Taping, Traction, Ultrasound, Ionotophoresis 4mg /ml Dexamethasone, Manual therapy, and Re-evaluation.  PLAN FOR NEXT SESSION: Progress ankle ROM gradually, progress DF as tolerated; progress WB 25 lbs per week. Boot wean once WBAT per protocol (2 week boot wean progression). OKC Ankle Tband exercise, light resisted plantarflexion.    Venetia Endo, PT, DPT #[id number]  Venetia ONEIDA Endo, PT 01/24/2024, 1:23 PM

## 2024-01-26 ENCOUNTER — Encounter: Payer: Self-pay | Admitting: Physical Therapy

## 2024-01-30 NOTE — Therapy (Unsigned)
 OUTPATIENT PHYSICAL THERAPY ANKLE POST-OP TREATMENT   Patient Name: Jake Wagner MRN: [id number] DOB:[birth year], 63 y.o., male Today's Date: 01/31/2024  END OF SESSION:  PT End of Session - 01/31/24 0954     Visit Number 5    Number of Visits 21    Date for PT Re-Evaluation 03/21/24    PT Start Time 0948    PT Stop Time 1027    PT Time Calculation (min) 39 min    Equipment Utilized During Treatment --   bilateral axillary crutches, L CAM boot   Activity Tolerance Patient tolerated treatment well    Behavior During Therapy WFL for tasks assessed/performed          Past Medical History:  Diagnosis Date   Anginal pain (HCC)    History of chicken pox    Hypercholesteremia    Past Surgical History:  Procedure Laterality Date   CARDIAC CATHETERIZATION Left 01/07/2016   Procedure: Left Heart Cath and Coronary Angiography;  Surgeon: Cara JONETTA Lovelace, MD;  Location: [facility];  Service: Cardiovascular;  Laterality: Left;   Patient Active Problem List   Diagnosis Date Noted   Achilles tendon tear 12/18/2023   Right knee pain 04/14/2023   History of pneumonia 02/01/2023   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 04/29/2022   History of COVID-19 08/01/2021   Left shoulder pain 07/27/2021   Respiratory illness 07/12/2021   Acute cough 05/20/2021   Erectile dysfunction 01/30/2021   Right ankle pain 03/12/2020   Stress 10/21/2018   Rash 10/21/2018   Blood in semen 10/21/2018   Memory change 01/14/2018   Hemorrhoid 01/14/2018   History of malignant melanoma of skin 06/15/2017   History of melanoma in situ 05/30/2017   Aortic aneurysm (HCC) 10/22/2016   Chronic right shoulder pain 05/18/2016   Diastolic dysfunction 04/27/2016   Dilated aortic root (HCC) 04/27/2016   Adhesive capsulitis of right shoulder 03/16/2016   Light headedness 02/21/2016   Right shoulder pain 02/18/2016   Fatigue 10/14/2015   CAD (coronary artery disease) 10/14/2015   History of colonic  polyps 08/19/2015   Hypercholesterolemia 06/22/2015   History of knee surgery 06/22/2015   Health care maintenance 06/22/2015    PCP: Allena Hamilton, MD  REFERRING PROVIDER: Kaitlin M Picone, PA-C  REFERRING DIAG: [phone] (ICD-10-CM) - Strain of left Achilles tendon, initial encounter   Rationale for Evaluation and Treatment: Habilitation  THERAPY DIAG: Pain in left ankle and joints of left foot  Difficulty in walking, not elsewhere classified  Muscle weakness (generalized)  ONSET DATE: L Achilles tendon repair (DOS 12/13/23)   FOLLOW-UP APPT SCHEDULED WITH REFERRING PROVIDER: Yes ; in 3 weeks from initial eval, next f/u with surgeon's office  PERTINENT HISTORY: Pt is a 64 year old male s/p L Achilles tendon repair (DOS: 12/13/23). Patient reports no post-op complications. Patient reports some tenderness along Achilles tendon region. Pt has upstairs bedroom and scoots on bottom to negotiate steps.   PAIN:    Pain Intensity: Present: 0/10, Worst: 0/10 Pain location: Achilles tendon  Radiating: No  Swelling: Yes ; minor swelling on ankle Numbness/Tingling: Yes; seldom tingling in foot  History of prior back, hip, knee, or ankle injury, pain, surgery, or therapy: Yes; L ankle dislocation in 8th grade, no other significant Hx  Imaging: No  Typical footwear:  Red flags: Negative for personal history of cancer, chills/fever, night sweats, nausea, vomiting, unrelenting pain): Negative  PRECAUTIONS: Other: Weightbearing restriction and no DF beyond neutral   WEIGHT BEARING  RESTRICTIONS: Yes 50 lbs PWB, increase 25 lbs each successive week starting 01/11/24  FALLS: Has patient fallen in last 6 months? No  Living Environment Lives with: lives with their spouse and brother-in-law Lives in: House/apartment Stairs: Yes: Internal: 16 steps; on left going up and External: 3 steps; on right going up (going into garage) Has following equipment at home: Crutches, knee scooter   Prior  level of function: Independent  Occupational demands: Photography business  Hobbies: Working on yard, Psychologist, clinical  Patient Goals: Return to baseline    OBJECTIVE (data from initial evaluation unless otherwise dated):   Patient Surveys  LEFS: 21/80 = 26.3%   Gross Musculoskeletal Assessment Bulk: Normal Tone: Normal Moderate ankle edema, mild erythema along incision and ankle. No sign of acute infection or delayed healing.   GAIT: Distance walked: 30 ft Assistive device utilized: Bilateral axillary crutches Level of assistance: SBA Comments: PWB LLE in bilateral axillary crutches in CAM boot only, proper use of upper limb support to offload surgical LE   AROM AROM (Normal range in degrees) AROM  01/11/24      Knee    Flexion (135) WNL WNL  Extension (0) WNL WNL      Ankle    Dorsiflexion (20)  -15  Plantarflexion (50)  40  Inversion (35)  20  Eversion (15)  WNL  (* = pain; Blank rows = not tested)   LE MMT: MMT (out of 5) Right 01/11/24 Left 01/11/24  Hip flexion 4- 4  Hip extension    Hip abduction 4+ 4+  Hip adduction    Hip internal rotation    Hip external rotation    Knee flexion 5 5  Knee extension 5 5  Ankle dorsiflexion 5 3+  Ankle plantarflexion 5 2  Ankle inversion 5 3+  Ankle eversion 5 3+  (* = pain; Blank rows = not tested)  Sensation Grossly intact to light touch throughout bilateral LEs as determined by testing dermatomes L2-S2. Proprioception, stereognosis, and hot/cold testing deferred on this date.  Reflexes Deferred  Palpation Mid-substance Achilles 1, gastroc-Achilles musculotendinous junction 1, insertional Achilles 0, calcaneal tuberosity 0 Graded on 0-4 scale (0 = no pain, 1 = pain, 2 = pain with wincing/grimacing/flinching, 3 = pain with withdrawal, 4 = unwilling to allow palpation), (Blank rows = not tested)  Passive Accessory Motion Deferred  VASCULAR Dorsalis pedis and posterior tibial pulses are palpable Capillary  refill WNL      TODAY'S TREATMENT: DATE: 01/31/2024   SUBJECTIVE STATEMENT:   Pt reports some fleeting paresthesias in boot with prolonged time in CAM boot with it strapped tight. It feels better when boot is off, and pt denies notable pain or numbness/paresthesias at arrival. Pt spoke with PA at [facility] about use of compression socks for edema and paresthesias. Pt was instructed to discontinue crutches and was given 5-week boot wean protocol.  Next post-op f/u early October 2025   Manual Therapy - joint mobility, ROM; prevention of motion loss during period of immobilization/CAM boot  Pt reclined: L ankle PROM within pt tolerance, DF as tolerated, no aggressive stretching; x 3 min  -PROM measurements taken; PF WNL, DF 18, INV WNL, EV WNL Midfoot ER/IR PROM; x 10 reps Subtalar inversion/eversion PROM; x 10 reps    Therapeutic Exercise - for improved soft tissue flexibility and extensibility as needed for ROM, improved strength as needed to improve performance of CKC  activities/functional movements     Reclined ankle Tband; PF x 20 ea dir  with Black Tband  Reclined ankle Tband; inversion and eversion x 10 ea dir with Black Tband   -for HEP review  -PT holding band for INV   Seated heel slide for DF ROM; reviewed  PATIENT EDUCATION: Discussed boot wean protocol and initiating first stage in protocol once he is pain-free in CAM boot without external support. Discussed expected progression in PT over next week per 6-10 week stage in protocol.    *next visit* NuStep; Level 3, x 5 minutes - for improved soft tissue mobility and increased tissue temperature to improve muscle performance   -subjective gathered during this time  *not today* Seated towel scrunch; 2 x 1 min, with added 1-lb Dbell on towel  Ankle pump; PF/DF AROM as tolerated; x20 into PF and DF Seated ankle inversion/eversion with towel; x 20 Active straight leg raise; 2 x 10   Therapeutic Activity - to  improve capacity for gait with less reliance on AD, pre-gait activities to improve gait stability and adequate weight shift   Weight shifting, staggered stance; x20 each, with RLE forward and LLE forward Gait in hallway with cueing for heel to toe progression, in CAM boot with no AD; x2 D/B length of hall  -no LOB  -no pain   PATIENT EDUCATION:  Education details: see above for patient education details Person educated: Patient Education method: Explanation, Demonstration, and Handouts Education comprehension: verbalized understanding and returned demonstration   HOME EXERCISE PROGRAM:  Access Code: [email or web address] URL: [email or web address] Date: 01/17/2024 Prepared by: Venetia Endo  Exercises - Supine Ankle Inversion Eversion AROM  - 2 x daily - 7 x weekly - 2 sets - 10 reps - Towel Scrunches  - 2 x daily - 7 x weekly - 1 sets - 1-2 min hold - Long Sitting Ankle Plantar Flexion with Resistance  - 1 x daily - 7 x weekly - 2 sets - 10 reps - Long Sitting Ankle Eversion with Resistance  - 1 x daily - 7 x weekly - 2 sets - 10 reps - Ankle Inversion with Resistance  - 1 x daily - 7 x weekly - 2 sets - 10 reps - Active Straight Leg Raise with Quad Set  - 1 x daily - 7 x weekly - 2 sets - 10 reps   ASSESSMENT:  CLINICAL IMPRESSION: Patient was given 5-week boot wean protocol; per post-op guidelines, pt to initiate boot wean protocol once he is walking in boot for 1 week without pain or swelling. Pt has discontinued crutches and is safe with short-distance gait (home-level) with boot on. He has normalizing L ankle ROM in all planes and exhibits normal dorsiflexion ROM today. We modestly progressed with non-weightbearing ankle isotonics and discussed plan for progressive activity in sneaker as pt continues forward with boot wean protocol. Patient has expected post-operative deficits in ankle/plantarflexor strength, post-op edema, gait stability/changes. Pt will continue to  benefit from skilled PT services to address deficits and improve function.   OBJECTIVE IMPAIRMENTS: Abnormal gait, decreased activity tolerance, decreased balance, decreased mobility, difficulty walking, decreased ROM, decreased strength, hypomobility, increased edema, impaired flexibility, and pain.   ACTIVITY LIMITATIONS: lifting, standing, squatting, stairs, transfers, and locomotion level  PARTICIPATION LIMITATIONS: driving, community activity, yard work, and Aeronautical engineer  PERSONAL FACTORS: 3 comorbidities (CVD/CAD, anxiety, dyslipidemia) are also affecting patient's functional outcome.   REHAB POTENTIAL: Good  CLINICAL DECISION MAKING: Evolving/moderate complexity  EVALUATION COMPLEXITY: Moderate   GOALS: Goals reviewed with patient? Yes  SHORT TERM GOALS: Target date: 02/01/2024  Pt  will be independent with HEP to improve strength and decrease ankle pain to improve pain-free function at home and work. Baseline: 01/11/24: Baseline home exercises reviewed.  Goal status: INITIAL  Pt will attain dorsiflexion to 10 degrees or greater by 8 weeks as needed for normalized gait pattern Baseline: 01/11/24: -15 deg DF at baseline/eval.  Goal status: INITIAL   LONG TERM GOALS: Target date: 05/13/2024  Pt will ambulate with normalized gait pattern for 660 feet or greater without reproduction of ankle pain and without AD or boot indicative of ability to perform normal community-level ambulation Baseline: 01/11/24: Ambulating home-level distance with bilateral crutches and CAM boot, partial weightbearing 25 lbs.  Goal status: INITIAL  2.  Pt will negotiate full flight of steps with reciprocal pattern and no major deviations or pain as needed for negotiating 2-story home with upstairs primary bedroom.  Baseline: 01/11/24: Pt has to scoot on stairs and use knee scooter at top/bottom of steps.  Goal status: INITIAL  3.  Pt will increase LEFS score to 60/80 or greater in order demonstrate  clinically significant improvement in ankle pain/level of function.       Baseline: 01/11/24: 21/80 = 26.3% Goal status: INITIAL  4.  Pt will increase strength of tested ankle musculature to 4+/5 or greater MMT grade in order to demonstrate improvement in strength and function  Baseline: 01/11/24:  Goal status: INITIAL   PLAN: PT FREQUENCY: 1-2x/week  PT DURATION: 16 weeks  PLANNED INTERVENTIONS: Therapeutic exercises, Therapeutic activity, Neuromuscular re-education, Balance training, Gait training, Patient/Family education, Self Care, Joint mobilization, Joint manipulation, Vestibular training, Canalith repositioning, Orthotic/Fit training, DME instructions, Dry Needling, Electrical stimulation, Spinal manipulation, Spinal mobilization, Cryotherapy, Moist heat, Taping, Traction, Ultrasound, Ionotophoresis 4mg /ml Dexamethasone, Manual therapy, and Re-evaluation.  PLAN FOR NEXT SESSION: WBAT in CAM boot at this time. Boot wean once pain-free with full weightbearing in boot x 1 week per protocol (5 week boot wean progression). Progress weightbearing in sneaker with successive visits as pt progresses with boot wean protocol. [place] Ankle Tband exercise, gradual progression of resisted plantarflexion with initial heel raises with bodyweight partially eliminated.    Venetia Endo, PT, DPT #[id number]  Venetia ONEIDA Endo, PT 01/31/2024, 10:29 AM

## 2024-01-31 ENCOUNTER — Ambulatory Visit: Attending: Orthopedic Surgery | Admitting: Physical Therapy

## 2024-01-31 ENCOUNTER — Encounter: Payer: Self-pay | Admitting: Physical Therapy

## 2024-01-31 DIAGNOSIS — M25572 Pain in left ankle and joints of left foot: Secondary | ICD-10-CM | POA: Diagnosis present

## 2024-01-31 DIAGNOSIS — M6281 Muscle weakness (generalized): Secondary | ICD-10-CM | POA: Diagnosis present

## 2024-01-31 DIAGNOSIS — R262 Difficulty in walking, not elsewhere classified: Secondary | ICD-10-CM | POA: Insufficient documentation

## 2024-02-04 NOTE — Therapy (Unsigned)
 OUTPATIENT PHYSICAL THERAPY ANKLE POST-OP TREATMENT   Patient Name: Jake Wagner MRN: [id number] DOB:January 25, 1961, 63 y.o., male Today's Date: 02/05/2024  END OF SESSION:  PT End of Session - 02/05/24 1115     Visit Number 6    Number of Visits 21    Date for PT Re-Evaluation 03/21/24    PT Start Time 1115    PT Stop Time 1155    PT Time Calculation (min) 40 min    Equipment Utilized During Treatment --   bilateral axillary crutches, L CAM boot   Activity Tolerance Patient tolerated treatment well    Behavior During Therapy WFL for tasks assessed/performed           Past Medical History:  Diagnosis Date   Anginal pain (HCC)    History of chicken pox    Hypercholesteremia    Past Surgical History:  Procedure Laterality Date   CARDIAC CATHETERIZATION Left 01/07/2016   Procedure: Left Heart Cath and Coronary Angiography;  Surgeon: Cara JONETTA Lovelace, MD;  Location: [facility];  Service: Cardiovascular;  Laterality: Left;   Patient Active Problem List   Diagnosis Date Noted   Achilles tendon tear 12/18/2023   Right knee pain 04/14/2023   History of pneumonia 02/01/2023   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 04/29/2022   History of COVID-19 08/01/2021   Left shoulder pain 07/27/2021   Respiratory illness 07/12/2021   Acute cough 05/20/2021   Erectile dysfunction 01/30/2021   Right ankle pain 03/12/2020   Stress 10/21/2018   Rash 10/21/2018   Blood in semen 10/21/2018   Memory change 01/14/2018   Hemorrhoid 01/14/2018   History of malignant melanoma of skin 06/15/2017   History of melanoma in situ 05/30/2017   Aortic aneurysm (HCC) 10/22/2016   Chronic right shoulder pain 05/18/2016   Diastolic dysfunction 04/27/2016   Dilated aortic root (HCC) 04/27/2016   Adhesive capsulitis of right shoulder 03/16/2016   Light headedness 02/21/2016   Right shoulder pain 02/18/2016   Fatigue 10/14/2015   CAD (coronary artery disease) 10/14/2015   History of  colonic polyps 08/19/2015   Hypercholesterolemia 06/22/2015   History of knee surgery 06/22/2015   Health care maintenance 06/22/2015    PCP: Allena Hamilton, MD  REFERRING PROVIDER: Kaitlin M Picone, PA-C  REFERRING DIAG: [phone] (ICD-10-CM) - Strain of left Achilles tendon, initial encounter   Rationale for Evaluation and Treatment: Habilitation  THERAPY DIAG: Pain in left ankle and joints of left foot  Difficulty in walking, not elsewhere classified  Muscle weakness (generalized)  ONSET DATE: L Achilles tendon repair (DOS 12/13/23)   FOLLOW-UP APPT SCHEDULED WITH REFERRING PROVIDER: Yes ; in 3 weeks from initial eval, next f/u with surgeon's office  PERTINENT HISTORY: Pt is a 63 year old male s/p L Achilles tendon repair (DOS: 12/13/23). Patient reports no post-op complications. Patient reports some tenderness along Achilles tendon region. Pt has upstairs bedroom and scoots on bottom to negotiate steps.   PAIN:    Pain Intensity: Present: 0/10, Worst: 0/10 Pain location: Achilles tendon  Radiating: No  Swelling: Yes ; minor swelling on ankle Numbness/Tingling: Yes; seldom tingling in foot  History of prior back, hip, knee, or ankle injury, pain, surgery, or therapy: Yes; L ankle dislocation in 8th grade, no other significant Hx  Imaging: No  Typical footwear:  Red flags: Negative for personal history of cancer, chills/fever, night sweats, nausea, vomiting, unrelenting pain): Negative  PRECAUTIONS: Other: Weightbearing restriction and no DF beyond neutral   WEIGHT  BEARING RESTRICTIONS: Yes 50 lbs PWB, increase 25 lbs each successive week starting 01/11/24  FALLS: Has patient fallen in last 6 months? 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 No sign of acute infection or delayed healing.   GAIT: Distance walked: 30 ft Assistive device utilized: Bilateral axillary crutches Level of assistance: SBA Comments: PWB LLE in bilateral axillary crutches in CAM boot only, proper use of upper limb support to offload surgical LE   AROM AROM (Normal range in degrees) AROM  01/11/24      Knee    Flexion (135) WNL WNL  Extension (0) WNL WNL      Ankle    Dorsiflexion (20)  -15  Plantarflexion (50)  40  Inversion (35)  20  Eversion (15)  WNL  (* = pain; Blank rows = not tested)   LE MMT: MMT (out of 5) Right 01/11/24 Left 01/11/24  Hip flexion 4- 4  Hip extension    Hip abduction 4+ 4+  Hip adduction    Hip internal rotation    Hip external rotation    Knee flexion 5 5  Knee extension 5 5  Ankle dorsiflexion 5 3+  Ankle plantarflexion 5 2  Ankle inversion 5 3+  Ankle eversion 5 3+  (* = pain; Blank rows = not tested)  Sensation Grossly intact to light touch throughout bilateral LEs as determined by testing dermatomes L2-S2. Proprioception, stereognosis, and hot/cold testing deferred on this date.  Reflexes Deferred  Palpation Mid-substance Achilles 1, gastroc-Achilles musculotendinous junction 1, insertional Achilles 0, calcaneal tuberosity 0 Graded on 0-4 scale (0 = no pain, 1 = pain, 2 = pain with wincing/grimacing/flinching, 3 = pain with withdrawal, 4 = unwilling to allow palpation), (Blank rows = not tested)  Passive Accessory Motion Deferred  VASCULAR Dorsalis pedis and posterior tibial pulses are  palpable Capillary refill WNL      TODAY'S TREATMENT: DATE: 02/05/2024   SUBJECTIVE STATEMENT:   Pt reports some soreness towards calcaneal tuberosity region. He reports tolerating walking in sneaker up to 1-2 hours at this time. He reports no notable pain during cycling today. He requests for longer Tband cut-out for ankle exercises.    Next post-op f/u early October 2025   Manual Therapy - joint mobility, ROM; prevention of motion loss during period of immobilization/CAM boot  Pt reclined: L ankle PROM within pt tolerance, DF as tolerated, no aggressive stretching; x 2 min  -PROM measurements taken; PF WNL, DF 20, INV WNL, EV WNL Midfoot ER/IR PROM; x 10 reps Subtalar inversion/eversion PROM; x 10 reps    Therapeutic Exercise - for improved soft tissue flexibility and extensibility as needed for ROM, improved strength as needed to improve performance of CKC  activities/functional movements   NuStep; Level 3, x 5 minutes - for improved soft tissue mobility and increased tissue temperature to improve muscle performance    -subjective gathered during this time  Total Gym; Level 14; double-limb heel raise; 2 x 10   Seated towel scrunch; 2 x 1 min, with added 1-lb Dbell on towel  PATIENT EDUCATION: Reviewed boot wean protocol from referring office and provided new resistance band for pt.    *not today* Reclined ankle Tband; PF x 20 ea dir with Black Tband  Reclined ankle Tband; inversion and eversion x 10 ea dir with Black Tband   -for HEP review  -PT holding band for INV   Ankle pump; PF/DF AROM as tolerated; x20 into PF and DF Seated ankle inversion/eversion with towel; x 20 Active straight leg raise; 2 x 10   Therapeutic Activity - to improve capacity for gait with less reliance on AD, pre-gait activities to improve gait stability and adequate weight shift  Forward to retro step in // bars; 5x D/B Toe tapping on 6-inch step to simulate side-to-side weight shifting  with ambulation Forward gait in hallway, focus on heel to toe progression and symmetrical weight shift; x3 D/B 70-ft hallway   PATIENT EDUCATION:  Education details: see above for patient education details Person educated: Patient Education method: Explanation, Demonstration, and Handouts Education comprehension: verbalized understanding and returned demonstration   HOME EXERCISE PROGRAM:  Access Code: [email or web address] URL: [email or web address] Date: 01/17/2024 Prepared by: Venetia Endo  Exercises - Supine Ankle Inversion Eversion AROM  - 2 x daily - 7 x weekly - 2 sets - 10 reps - Towel Scrunches  - 2 x daily - 7 x weekly - 1 sets - 1-2 min hold - Long Sitting Ankle Plantar Flexion with Resistance  - 1 x daily - 7 x weekly - 2 sets - 10 reps - Long Sitting Ankle Eversion with Resistance  - 1 x daily - 7 x weekly - 2 sets - 10 reps - Ankle Inversion with Resistance  - 1 x daily - 7 x weekly - 2 sets - 10 reps - Active Straight Leg Raise with Quad Set  - 1 x daily - 7 x weekly - 2 sets - 10 reps   ASSESSMENT:  CLINICAL IMPRESSION: Patient is continuing with 5-week boot wean protocol. We completed simple stepping and weight shifting activities in sneaker today. Bilateral heel raise was completed with Total Gym at Level 14 (partial body weight) without marked pain. Pt was instructed on continuing ankle isotonics at home. He exhibits normalizing ankle ROM at this time with no notable dorsiflexion stiffness. Patient has expected post-operative deficits in ankle/plantarflexor strength, post-op edema, gait stability/changes. Pt will continue to benefit from skilled PT services to address deficits and improve function.   OBJECTIVE IMPAIRMENTS: Abnormal gait, decreased activity tolerance, decreased balance, decreased mobility, difficulty walking, decreased ROM, decreased strength, hypomobility, increased edema, impaired flexibility, and pain.   ACTIVITY LIMITATIONS: lifting,  standing, squatting, stairs, transfers, and locomotion level  PARTICIPATION LIMITATIONS: driving, community activity, yard work, and Aeronautical engineer  PERSONAL FACTORS: 3 comorbidities (CVD/CAD, anxiety, dyslipidemia) are also affecting patient's functional outcome.   REHAB POTENTIAL: Good  CLINICAL DECISION MAKING: Evolving/moderate complexity  EVALUATION COMPLEXITY: Moderate   GOALS: Goals reviewed with patient? Yes  SHORT TERM GOALS: Target date: 02/01/2024  Pt will be independent with HEP to improve strength and decrease ankle pain to improve pain-free function at home and work. Baseline: 01/11/24: Baseline home exercises reviewed.  Goal status: INITIAL  Pt will attain dorsiflexion to 10 degrees or greater by 8 weeks as needed for normalized gait pattern Baseline: 01/11/24: -15 deg DF at baseline/eval.  Goal status: INITIAL  LONG TERM GOALS: Target date: 05/13/2024  Pt will ambulate with normalized gait pattern for 660 feet or greater without reproduction of ankle pain and without AD or boot indicative of ability to perform normal community-level ambulation Baseline: 01/11/24: Ambulating home-level distance with bilateral crutches and CAM boot, partial weightbearing 25 lbs.  Goal status: INITIAL  2.  Pt will negotiate full flight of steps with reciprocal pattern and no major deviations or pain as needed for negotiating 2-story home with upstairs primary bedroom.  Baseline: 01/11/24: Pt has to scoot on stairs and use knee scooter at top/bottom of steps.  Goal status: INITIAL  3.  Pt will increase LEFS score to 60/80 or greater in order demonstrate clinically significant improvement in ankle pain/level of function.       Baseline: 01/11/24: 21/80 = 26.3% Goal status: INITIAL  4.  Pt will increase strength of tested ankle musculature to 4+/5 or greater MMT grade in order to demonstrate improvement in strength and function  Baseline: 01/11/24:  Goal status: INITIAL   PLAN: PT  FREQUENCY: 1-2x/week  PT DURATION: 16 weeks  PLANNED INTERVENTIONS: Therapeutic exercises, Therapeutic activity, Neuromuscular re-education, Balance training, Gait training, Patient/Family education, Self Care, Joint mobilization, Joint manipulation, Vestibular training, Canalith repositioning, Orthotic/Fit training, DME instructions, Dry Needling, Electrical stimulation, Spinal manipulation, Spinal mobilization, Cryotherapy, Moist heat, Taping, Traction, Ultrasound, Ionotophoresis 4mg /ml Dexamethasone, Manual therapy, and Re-evaluation.  PLAN FOR NEXT SESSION: WBAT in CAM boot at this time. Boot wean once pain-free with full weightbearing in boot x 1 week per protocol (5 week boot wean progression). Progress weightbearing in sneaker with successive visits as pt progresses with boot wean protocol. [place] Ankle Tband exercise, gradual progression of resisted plantarflexion with initial heel raises with bodyweight partially eliminated.    Venetia Endo, PT, DPT #[id number]  Venetia ONEIDA Endo, PT 02/05/2024, 2:08 PM

## 2024-02-05 ENCOUNTER — Encounter: Payer: Self-pay | Admitting: Physical Therapy

## 2024-02-05 ENCOUNTER — Ambulatory Visit: Admitting: Physical Therapy

## 2024-02-05 DIAGNOSIS — M25572 Pain in left ankle and joints of left foot: Secondary | ICD-10-CM

## 2024-02-05 DIAGNOSIS — M6281 Muscle weakness (generalized): Secondary | ICD-10-CM

## 2024-02-05 DIAGNOSIS — R262 Difficulty in walking, not elsewhere classified: Secondary | ICD-10-CM

## 2024-02-07 ENCOUNTER — Ambulatory Visit: Admitting: Physical Therapy

## 2024-02-07 ENCOUNTER — Encounter: Payer: Self-pay | Admitting: Physical Therapy

## 2024-02-07 DIAGNOSIS — R262 Difficulty in walking, not elsewhere classified: Secondary | ICD-10-CM

## 2024-02-07 DIAGNOSIS — M6281 Muscle weakness (generalized): Secondary | ICD-10-CM

## 2024-02-07 DIAGNOSIS — M25572 Pain in left ankle and joints of left foot: Secondary | ICD-10-CM | POA: Diagnosis not present

## 2024-02-07 NOTE — Therapy (Signed)
 OUTPATIENT PHYSICAL THERAPY ANKLE POST-OP TREATMENT   Patient Name: Jake Wagner MRN: [id number] DOB:[birth year], 63 y.o., male Today's Date: 02/07/2024  END OF SESSION:  PT End of Session - 02/07/24 1132     Visit Number 7    Number of Visits 21    Date for PT Re-Evaluation 03/21/24    PT Start Time 1126    PT Stop Time 1205    PT Time Calculation (min) 39 min    Equipment Utilized During Treatment --   bilateral axillary crutches, L CAM boot   Activity Tolerance Patient tolerated treatment well    Behavior During Therapy WFL for tasks assessed/performed            Past Medical History:  Diagnosis Date   Anginal pain (HCC)    History of chicken pox    Hypercholesteremia    Past Surgical History:  Procedure Laterality Date   CARDIAC CATHETERIZATION Left 01/07/2016   Procedure: Left Heart Cath and Coronary Angiography;  Surgeon: Cara JONETTA Lovelace, MD;  Location: [facility];  Service: Cardiovascular;  Laterality: Left;   Patient Active Problem List   Diagnosis Date Noted   Achilles tendon tear 12/18/2023   Right knee pain 04/14/2023   History of pneumonia 02/01/2023   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 04/29/2022   History of COVID-19 08/01/2021   Left shoulder pain 07/27/2021   Respiratory illness 07/12/2021   Acute cough 05/20/2021   Erectile dysfunction 01/30/2021   Right ankle pain 03/12/2020   Stress 10/21/2018   Rash 10/21/2018   Blood in semen 10/21/2018   Memory change 01/14/2018   Hemorrhoid 01/14/2018   History of malignant melanoma of skin 06/15/2017   History of melanoma in situ 05/30/2017   Aortic aneurysm (HCC) 10/22/2016   Chronic right shoulder pain 05/18/2016   Diastolic dysfunction 04/27/2016   Dilated aortic root (HCC) 04/27/2016   Adhesive capsulitis of right shoulder 03/16/2016   Light headedness 02/21/2016   Right shoulder pain 02/18/2016   Fatigue 10/14/2015   CAD (coronary artery disease) 10/14/2015   History of  colonic polyps 08/19/2015   Hypercholesterolemia 06/22/2015   History of knee surgery 06/22/2015   Health care maintenance 06/22/2015    PCP: Allena Hamilton, MD  REFERRING PROVIDER: Kaitlin M Picone, PA-C  REFERRING DIAG: [phone] (ICD-10-CM) - Strain of left Achilles tendon, initial encounter   Rationale for Evaluation and Treatment: Habilitation  THERAPY DIAG: Pain in left ankle and joints of left foot  Difficulty in walking, not elsewhere classified  Muscle weakness (generalized)  ONSET DATE: L Achilles tendon repair (DOS 12/13/23)   FOLLOW-UP APPT SCHEDULED WITH REFERRING PROVIDER: Yes ; in 3 weeks from initial eval, next f/u with surgeon's office  PERTINENT HISTORY: Pt is a 63 year old male s/p L Achilles tendon repair (DOS: 12/13/23). Patient reports no post-op complications. Patient reports some tenderness along Achilles tendon region. Pt has upstairs bedroom and scoots on bottom to negotiate steps.   PAIN:    Pain Intensity: Present: 0/10, Worst: 0/10 Pain location: Achilles tendon  Radiating: No  Swelling: Yes ; minor swelling on ankle Numbness/Tingling: Yes; seldom tingling in foot  History of prior back, hip, knee, or ankle injury, pain, surgery, or therapy: Yes; L ankle dislocation in 8th grade, no other significant Hx  Imaging: No  Typical footwear:  Red flags: Negative for personal history of cancer, chills/fever, night sweats, nausea, vomiting, unrelenting pain): Negative  PRECAUTIONS: Other: Weightbearing restriction and no DF beyond neutral  WEIGHT BEARING RESTRICTIONS: Yes 50 lbs PWB, increase 25 lbs each successive week starting 01/11/24  FALLS: Has patient fallen in last 6 months? 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 No sign of acute infection or delayed healing.   GAIT: Distance walked: 30 ft Assistive device utilized: Bilateral axillary crutches Level of assistance: SBA Comments: PWB LLE in bilateral axillary crutches in CAM boot only, proper use of upper limb support to offload surgical LE   AROM AROM (Normal range in degrees) AROM  01/11/24      Knee    Flexion (135) WNL WNL  Extension (0) WNL WNL      Ankle    Dorsiflexion (20)  -15  Plantarflexion (50)  40  Inversion (35)  20  Eversion (15)  WNL  (* = pain; Blank rows = not tested)   LE MMT: MMT (out of 5) Right 01/11/24 Left 01/11/24  Hip flexion 4- 4  Hip extension    Hip abduction 4+ 4+  Hip adduction    Hip internal rotation    Hip external rotation    Knee flexion 5 5  Knee extension 5 5  Ankle dorsiflexion 5 3+  Ankle plantarflexion 5 2  Ankle inversion 5 3+  Ankle eversion 5 3+  (* = pain; Blank rows = not tested)  Sensation Grossly intact to light touch throughout bilateral LEs as determined by testing dermatomes L2-S2. Proprioception, stereognosis, and hot/cold testing deferred on this date.  Reflexes Deferred  Palpation Mid-substance Achilles 1, gastroc-Achilles musculotendinous junction 1, insertional Achilles 0, calcaneal tuberosity 0 Graded on 0-4 scale (0 = no pain, 1 = pain, 2 = pain with wincing/grimacing/flinching, 3 = pain with withdrawal, 4 = unwilling to allow palpation), (Blank rows = not tested)  Passive Accessory Motion Deferred  VASCULAR Dorsalis pedis and posterior tibial pulses are  palpable Capillary refill WNL      TODAY'S TREATMENT: DATE: 02/07/2024   SUBJECTIVE STATEMENT:   Pt reports some soreness towards calcaneal tuberosity region. He reports tolerating walking in sneaker up to 1-2 hours at this time. He reports no notable pain during cycling today. He requests for longer Tband cut-out for ankle exercises.    Next post-op f/u early October 2025   Manual Therapy - joint mobility, ROM; prevention of motion loss during period of immobilization/CAM boot  Pt reclined: L ankle PROM within pt tolerance, DF as tolerated, no aggressive stretching; x 2 min  -PROM measurements taken; PF WNL, DF 20, INV WNL, EV WNL Midfoot ER/IR PROM; x 10 reps Subtalar inversion/eversion PROM; x 10 reps    Therapeutic Exercise - for improved soft tissue flexibility and extensibility as needed for ROM, improved strength as needed to improve performance of CKC  activities/functional movements   NuStep; Level 3, x 5 minutes - for improved soft tissue mobility and increased tissue temperature to improve muscle performance    -subjective gathered during this time  Total Gym; Level 14; double-limb heel raise; 2 x 10   Seated heel raise; 2 x 10  -added to HEP   Seated towel scrunch; 2 x 1 min, with added 1-lb Dbell on towel  PATIENT EDUCATION: Reviewed boot wean protocol from referring office and provided new resistance band for pt.    *not today* Reclined ankle Tband; PF x 20 ea dir with Black Tband  Reclined ankle Tband; inversion and eversion x 10 ea dir with Black Tband   -for HEP review  -PT holding band for INV   Ankle pump; PF/DF AROM as tolerated; x20 into PF and DF Seated ankle inversion/eversion with towel; x 20 Active straight leg raise; 2 x 10   Therapeutic Activity - to improve capacity for gait with less reliance on AD, pre-gait activities to improve gait stability and adequate weight shift  Forward to retro step in // bars; 5x D/B Toe tapping on 6-inch  step to simulate side-to-side weight shifting with ambulation Forward gait in hallway, focus on heel to toe progression and symmetrical weight shift; x3 D/B 70-ft hallway   PATIENT EDUCATION:  Education details: see above for patient education details Person educated: Patient Education method: Explanation, Demonstration, and Handouts Education comprehension: verbalized understanding and returned demonstration   HOME EXERCISE PROGRAM:  Access Code: [email or web address] URL: [email or web address] Date: 02/07/2024 Prepared by: Venetia Endo  Exercises - Supine Ankle Inversion Eversion AROM  - 2 x daily - 7 x weekly - 2 sets - 10 reps - Towel Scrunches  - 2 x daily - 7 x weekly - 1 sets - 1-2 min hold - Long Sitting Ankle Plantar Flexion with Resistance  - 1 x daily - 7 x weekly - 2 sets - 10 reps - Long Sitting Ankle Eversion with Resistance  - 1 x daily - 7 x weekly - 2 sets - 10 reps - Seated Heel Raise  - 1-2 x daily - 7 x weekly - 2-3 sets - 10 reps - Ankle Inversion with Resistance  - 1 x daily - 7 x weekly - 2 sets - 10 reps - Active Straight Leg Raise with Quad Set  - 1 x daily - 7 x weekly - 2 sets - 10 reps   ASSESSMENT:  CLINICAL IMPRESSION: Patient is gradually weaning from CAM boot with 5-week protocol per surgeon's guidelines. Patient has good ankle ROM at this time and is able to complete plantarflexor isotonics (double-limb) at low intensity; HEP was updated today for seated heel raise. Pt fortunately has minimal pain now and is progressing gradually per post-op protocol. Patient has expected post-operative deficits in ankle/plantarflexor strength, post-op edema, gait stability/changes. Pt will continue to benefit from skilled PT services to address deficits and improve function.   OBJECTIVE IMPAIRMENTS: Abnormal gait, decreased activity tolerance, decreased balance, decreased mobility, difficulty walking, decreased ROM, decreased strength, hypomobility, increased  edema, impaired flexibility, and pain.   ACTIVITY LIMITATIONS: lifting, standing, squatting, stairs, transfers, and locomotion level  PARTICIPATION LIMITATIONS: driving, community activity, yard work, and Aeronautical engineer  PERSONAL FACTORS: 3 comorbidities (CVD/CAD, anxiety, dyslipidemia) are also affecting patient's functional outcome.   REHAB POTENTIAL: Good  CLINICAL DECISION MAKING: Evolving/moderate complexity  EVALUATION COMPLEXITY: Moderate   GOALS: Goals reviewed with patient? Yes  SHORT TERM GOALS: Target date: 02/01/2024  Pt will be independent with HEP to improve strength and decrease ankle pain to improve pain-free function at home and work. Baseline: 01/11/24: Baseline home exercises reviewed.  Goal status: INITIAL  Pt will attain dorsiflexion to  10 degrees or greater by 8 weeks as needed for normalized gait pattern Baseline: 01/11/24: -15 deg DF at baseline/eval.  Goal status: INITIAL   LONG TERM GOALS: Target date: 05/13/2024  Pt will ambulate with normalized gait pattern for 660 feet or greater without reproduction of ankle pain and without AD or boot indicative of ability to perform normal community-level ambulation Baseline: 01/11/24: Ambulating home-level distance with bilateral crutches and CAM boot, partial weightbearing 25 lbs.  Goal status: INITIAL  2.  Pt will negotiate full flight of steps with reciprocal pattern and no major deviations or pain as needed for negotiating 2-story home with upstairs primary bedroom.  Baseline: 01/11/24: Pt has to scoot on stairs and use knee scooter at top/bottom of steps.  Goal status: INITIAL  3.  Pt will increase LEFS score to 60/80 or greater in order demonstrate clinically significant improvement in ankle pain/level of function.       Baseline: 01/11/24: 21/80 = 26.3% Goal status: INITIAL  4.  Pt will increase strength of tested ankle musculature to 4+/5 or greater MMT grade in order to demonstrate improvement in strength and  function  Baseline: 01/11/24:  Goal status: INITIAL   PLAN: PT FREQUENCY: 1-2x/week  PT DURATION: 16 weeks  PLANNED INTERVENTIONS: Therapeutic exercises, Therapeutic activity, Neuromuscular re-education, Balance training, Gait training, Patient/Family education, Self Care, Joint mobilization, Joint manipulation, Vestibular training, Canalith repositioning, Orthotic/Fit training, DME instructions, Dry Needling, Electrical stimulation, Spinal manipulation, Spinal mobilization, Cryotherapy, Moist heat, Taping, Traction, Ultrasound, Ionotophoresis 4mg /ml Dexamethasone, Manual therapy, and Re-evaluation.  PLAN FOR NEXT SESSION: WBAT in CAM boot/sneaker at this time. Pt continuing with 5-week boot wean progression. Progress weightbearing activity in sneaker with successive visits as pt progresses with boot wean protocol. [place] Ankle Tband exercise, gradual progression of resisted plantarflexion with initial heel raises with bodyweight partially eliminated.    Venetia Endo, PT, DPT #[id number]  Venetia ONEIDA Endo, PT 02/07/2024, 11:32 AM

## 2024-02-12 ENCOUNTER — Encounter: Admitting: Physical Therapy

## 2024-02-13 ENCOUNTER — Ambulatory Visit: Admitting: Internal Medicine

## 2024-02-13 ENCOUNTER — Encounter: Payer: Self-pay | Admitting: Physical Therapy

## 2024-02-13 VITALS — BP 118/74 | HR 62 | Resp 16 | Ht 79.0 in | Wt 253.0 lb

## 2024-02-13 DIAGNOSIS — R361 Hematospermia: Secondary | ICD-10-CM | POA: Diagnosis not present

## 2024-02-13 DIAGNOSIS — I2510 Atherosclerotic heart disease of native coronary artery without angina pectoris: Secondary | ICD-10-CM | POA: Diagnosis not present

## 2024-02-13 DIAGNOSIS — Z0000 Encounter for general adult medical examination without abnormal findings: Secondary | ICD-10-CM

## 2024-02-13 DIAGNOSIS — E7800 Pure hypercholesterolemia, unspecified: Secondary | ICD-10-CM | POA: Diagnosis not present

## 2024-02-13 DIAGNOSIS — S86019D Strain of unspecified Achilles tendon, subsequent encounter: Secondary | ICD-10-CM

## 2024-02-13 DIAGNOSIS — F439 Reaction to severe stress, unspecified: Secondary | ICD-10-CM

## 2024-02-13 DIAGNOSIS — Z125 Encounter for screening for malignant neoplasm of prostate: Secondary | ICD-10-CM

## 2024-02-13 DIAGNOSIS — I719 Aortic aneurysm of unspecified site, without rupture: Secondary | ICD-10-CM

## 2024-02-13 DIAGNOSIS — Z860100 Personal history of colon polyps, unspecified: Secondary | ICD-10-CM

## 2024-02-13 DIAGNOSIS — Z23 Encounter for immunization: Secondary | ICD-10-CM

## 2024-02-13 LAB — CBC WITH DIFFERENTIAL/PLATELET
Basophils Absolute: 0 K/uL (ref 0.0–0.1)
Basophils Relative: 0.7 % (ref 0.0–3.0)
Eosinophils Absolute: 0.2 K/uL (ref 0.0–0.7)
Eosinophils Relative: 3.4 % (ref 0.0–5.0)
HCT: 45.6 % (ref 39.0–52.0)
Hemoglobin: 15.4 g/dL (ref 13.0–17.0)
Lymphocytes Relative: 24.3 % (ref 12.0–46.0)
Lymphs Abs: 1.4 K/uL (ref 0.7–4.0)
MCHC: 33.8 g/dL (ref 30.0–36.0)
MCV: 87.7 fl (ref 78.0–100.0)
Monocytes Absolute: 0.5 K/uL (ref 0.1–1.0)
Monocytes Relative: 8.9 % (ref 3.0–12.0)
Neutro Abs: 3.5 K/uL (ref 1.4–7.7)
Neutrophils Relative %: 62.7 % (ref 43.0–77.0)
Platelets: 221 K/uL (ref 150.0–400.0)
RBC: 5.2 Mil/uL (ref 4.22–5.81)
RDW: 13.5 % (ref 11.5–15.5)
WBC: 5.6 K/uL (ref 4.0–10.5)

## 2024-02-13 LAB — HEPATIC FUNCTION PANEL
ALT: 29 U/L (ref 0–53)
AST: 21 U/L (ref 0–37)
Albumin: 4.6 g/dL (ref 3.5–5.2)
Alkaline Phosphatase: 81 U/L (ref 39–117)
Bilirubin, Direct: 0.2 mg/dL (ref 0.0–0.3)
Total Bilirubin: 0.8 mg/dL (ref 0.2–1.2)
Total Protein: 7.4 g/dL (ref 6.0–8.3)

## 2024-02-13 LAB — LIPID PANEL
Cholesterol: 141 mg/dL (ref 0–200)
HDL: 38.2 mg/dL — ABNORMAL LOW (ref >39.00)
LDL Cholesterol: 79 mg/dL (ref 0–99)
NonHDL: 102.44
Total CHOL/HDL Ratio: 4
Triglycerides: 115 mg/dL (ref 0.0–149.0)
VLDL: 23 mg/dL (ref 0.0–40.0)

## 2024-02-13 LAB — URINALYSIS, ROUTINE W REFLEX MICROSCOPIC
Bilirubin Urine: NEGATIVE
Hgb urine dipstick: NEGATIVE
Ketones, ur: NEGATIVE
Leukocytes,Ua: NEGATIVE
Nitrite: NEGATIVE
Specific Gravity, Urine: 1.02 (ref 1.000–1.030)
Total Protein, Urine: NEGATIVE
Urine Glucose: NEGATIVE
Urobilinogen, UA: 0.2 (ref 0.0–1.0)
pH: 5.5 (ref 5.0–8.0)

## 2024-02-13 LAB — TSH: TSH: 3.73 u[IU]/mL (ref 0.35–5.50)

## 2024-02-13 LAB — PSA: PSA: 1.82 ng/mL (ref 0.10–4.00)

## 2024-02-13 NOTE — Progress Notes (Signed)
 Subjective:    Patient ID: Jake Wagner, male    DOB: [birth year], 63 y.o.   MRN: [id number]  Patient here for  Chief Complaint  Patient presents with   Annual Exam    HPI Here for a physical exam. Seeing ortho after suffering achilles tendon rupture. S/p mini open achilles repair - going to PT. Last seen 01/25/24 - recommended to continue PT, gradually wean off crutches and the boot as comfortable. Recommended compression socks. Saw cardiology 05/11/23 - s/p PCI to LAD in 12/2015. Felt stable. Recommended continuing crestor  and daily aspirin . Also recommended annual echo to follow thoracic aortic aneurysm. S/p torn meniscus right knee 01/2023. Surgery 05/2023. Reports increased stress. Discussed. Discussed counseling and referral to psychiatry. No chest pain reported. Breathing stable. No abdominal pain or bowel change.    Past Medical History:  Diagnosis Date   Anginal pain (HCC)    History of chicken pox    Hypercholesteremia    Past Surgical History:  Procedure Laterality Date   CARDIAC CATHETERIZATION Left 01/07/2016   Procedure: Left Heart Cath and Coronary Angiography;  Surgeon: Cara JONETTA Lovelace, MD;  Location: [facility];  Service: Cardiovascular;  Laterality: Left;   Family History  Adopted: Yes   Social History   Socioeconomic History   Marital status: Married    Spouse name: Not on file   Number of children: Not on file   Years of education: Not on file   Highest education level: Not on file  Occupational History   Not on file  Tobacco Use   Smoking status: Never   Smokeless tobacco: Never  Substance and Sexual Activity   Alcohol use: Yes    Alcohol/week: 0.0 standard drinks of alcohol   Drug use: Not on file   Sexual activity: Not on file  Other Topics Concern   Not on file  Social History Narrative   Not on file   Social Drivers of Health   Financial Resource Strain: Not on file  Food Insecurity: Not on file  Transportation Needs: Not on  file  Physical Activity: Not on file  Stress: Not on file  Social Connections: Not on file     Review of Systems  Constitutional:  Negative for appetite change and unexpected weight change.  HENT:  Negative for congestion, sinus pressure and sore throat.   Eyes:  Negative for pain and visual disturbance.  Respiratory:  Negative for cough, chest tightness and shortness of breath.   Cardiovascular:  Negative for chest pain, palpitations and leg swelling.  Gastrointestinal:  Negative for abdominal pain, diarrhea, nausea and vomiting.  Genitourinary:  Negative for difficulty urinating and dysuria.  Musculoskeletal:  Negative for myalgias.       Recent achilles tendon rupture. S/p torn meniscus - right knee s/p surgery 05/2023.   Skin:  Negative for color change and rash.  Neurological:  Negative for dizziness and headaches.  Hematological:  Negative for adenopathy. Does not bruise/bleed easily.  Psychiatric/Behavioral:         Increased stress as outlined.        Objective:     BP 118/74   Pulse 62   Resp 16   Ht 6' 7 (2.007 m)   Wt 253 lb (114.8 kg)   SpO2 98%   BMI 28.50 kg/m  Wt Readings from Last 3 Encounters:  02/13/24 253 lb (114.8 kg)  08/01/23 256 lb (116.1 kg)  02/01/23 238 lb 12.8 oz (108.3 kg)    Physical  Exam      Outpatient Encounter Medications as of 02/13/2024  Medication Sig   aspirin  81 MG EC tablet Take 81 mg by mouth daily.   hydrocortisone  2.5 % cream Apply topically 2 (two) times daily.   hydrocortisone -pramoxine (ANALPRAM  HC) 2.5-1 % rectal cream Place 1 application rectally 2 (two) times daily as needed for hemorrhoids or anal itching.   rosuvastatin  (CRESTOR ) 40 MG tablet Take 1 tablet (40 mg total) by mouth daily.   tadalafil  (CIALIS ) 20 MG tablet TAKE 1 TABLET (20 MG TOTAL) BY MOUTH EVERY 3 (THREE) DAYS AS NEEDED FOR ERECTILE DYSFUNCTION.   No facility-administered encounter medications on file as of 02/13/2024.     Lab Results  Component  Value Date   WBC 5.6 02/13/2024   HGB 15.4 02/13/2024   HCT 45.6 02/13/2024   PLT 221.0 02/13/2024   GLUCOSE 90 02/13/2024   CHOL 141 02/13/2024   TRIG 115.0 02/13/2024   HDL 38.20 (L) 02/13/2024   LDLCALC 79 02/13/2024   ALT 29 02/13/2024   AST 21 02/13/2024   NA 138 02/13/2024   K 4.2 02/13/2024   CL 106 02/13/2024   CREATININE 0.92 02/13/2024   BUN 19 02/13/2024   CO2 28 02/13/2024   TSH 3.73 02/13/2024   PSA 1.82 02/13/2024   HGBA1C 5.7 01/27/2022    DG Shoulder Right Result Date: 02/18/2016 CLINICAL DATA:  Pain.  Moving injury. EXAM: RIGHT SHOULDER - 2+ VIEW COMPARISON:  No prior. FINDINGS: Glenohumeral degenerative change. No evidence of fracture, dislocation, or separation. Apical pleural thickening noted consistent scarring. Punctate bony density noted in the right humeral head, most likely a bone island. IMPRESSION: Glenohumeral degenerative change.  No acute abnormality. Electronically Signed   By: Debby  Register   On: 02/18/2016 14:17       Assessment & Plan:  Routine general medical examination at a health care facility  Health care maintenance Assessment & Plan: Physical today 02/13/24  Colonoscopy 07/2015.  Recommended f/u in 10 years.  PSA 02/13/24 - 1.82.     Hypercholesterolemia Assessment & Plan: Continue crestor . Low cholesterol diet and exercise. Follow lipid panel and liver function tests.  Lab Results  Component Value Date   CHOL 141 02/13/2024   HDL 38.20 (L) 02/13/2024   LDLCALC 79 02/13/2024   TRIG 115.0 02/13/2024   CHOLHDL 4 02/13/2024     Orders: -     CBC with Differential/Platelet -     Basic metabolic panel with GFR -     Hepatic function panel -     Lipid panel -     TSH  Aortic aneurysm without rupture, unspecified portion of aorta [facility]) Assessment & Plan: Saw cardiology 05/11/23 - s/p PCI to LAD in 12/2015. Felt stable. Recommended continuing crestor  and daily aspirin . Also recommended annual echo to follow thoracic aortic  aneurysm. Reports is scheduled.    Coronary artery disease involving native coronary artery of native heart without angina pectoris Assessment & Plan: S/p stent LAD x 2.  Continue risk factor modification.  Continue f/u with cardiology.  Continue crestor . Stable. No chest pain.    Prostate cancer screening -     PSA  Need for shingles vaccine  Blood in semen Assessment & Plan: Still noticing intermittently.  Off plavix .  MRI prostate ok.  Saw urology.  Discussed possibility of prostatitis.  Was given rx for finasteride.  Elects not to take at this time.  D/w urology regarding f/u.   Orders: -  Urinalysis, Routine w reflex microscopic  Rupture of Achilles tendon, unspecified laterality, subsequent encounter Assessment & Plan: Seeing ortho after suffering achilles tendon rupture. S/p mini open achilles repair - going to PT. Last seen 01/25/24 - recommended to continue PT, gradually wean off crutches and the boot as comfortable. Recommended compression socks.    History of colonic polyps Assessment & Plan: Colonoscopy 07/2015.  Recommended f/u colonoscopy in 10 years.    Stress Assessment & Plan: Increased stress as outlined. Discussed counseling. Information given. Follow       Allena Hamilton, MD

## 2024-02-13 NOTE — Assessment & Plan Note (Addendum)
 Physical today 02/13/24  Colonoscopy 07/2015.  Recommended f/u in 10 years.  PSA 02/13/24 - 1.82.

## 2024-02-14 ENCOUNTER — Ambulatory Visit: Admitting: Physical Therapy

## 2024-02-14 ENCOUNTER — Encounter: Payer: Self-pay | Admitting: Physical Therapy

## 2024-02-14 DIAGNOSIS — M25572 Pain in left ankle and joints of left foot: Secondary | ICD-10-CM | POA: Diagnosis not present

## 2024-02-14 DIAGNOSIS — M6281 Muscle weakness (generalized): Secondary | ICD-10-CM

## 2024-02-14 DIAGNOSIS — R262 Difficulty in walking, not elsewhere classified: Secondary | ICD-10-CM

## 2024-02-14 NOTE — Therapy (Signed)
 OUTPATIENT PHYSICAL THERAPY ANKLE POST-OP TREATMENT   Patient Name: Jake Wagner MRN: [id number] DOB:09/24/1960, 63 y.o., male Today's Date: 02/14/2024  END OF SESSION:  PT End of Session - 02/14/24 1036     Visit Number 8    Number of Visits 21    Date for PT Re-Evaluation 03/21/24    PT Start Time 1030    PT Stop Time 1110    PT Time Calculation (min) 40 min    Equipment Utilized During Treatment --   bilateral axillary crutches, L CAM boot   Activity Tolerance Patient tolerated treatment well    Behavior During Therapy WFL for tasks assessed/performed             Past Medical History:  Diagnosis Date   Anginal pain (HCC)    History of chicken pox    Hypercholesteremia    Past Surgical History:  Procedure Laterality Date   CARDIAC CATHETERIZATION Left 01/07/2016   Procedure: Left Heart Cath and Coronary Angiography;  Surgeon: Cara JONETTA Lovelace, MD;  Location: [facility];  Service: Cardiovascular;  Laterality: Left;   Patient Active Problem List   Diagnosis Date Noted   Achilles tendon tear 12/18/2023   Right knee pain 04/14/2023   History of pneumonia 02/01/2023   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 04/29/2022   History of COVID-19 08/01/2021   Left shoulder pain 07/27/2021   Respiratory illness 07/12/2021   Acute cough 05/20/2021   Erectile dysfunction 01/30/2021   Right ankle pain 03/12/2020   Stress 10/21/2018   Rash 10/21/2018   Blood in semen 10/21/2018   Memory change 01/14/2018   Hemorrhoid 01/14/2018   History of malignant melanoma of skin 06/15/2017   History of melanoma in situ 05/30/2017   Aortic aneurysm (HCC) 10/22/2016   Chronic right shoulder pain 05/18/2016   Diastolic dysfunction 04/27/2016   Dilated aortic root (HCC) 04/27/2016   Adhesive capsulitis of right shoulder 03/16/2016   Light headedness 02/21/2016   Right shoulder pain 02/18/2016   Fatigue 10/14/2015   CAD (coronary artery disease) 10/14/2015   History of  colonic polyps 08/19/2015   Hypercholesterolemia 06/22/2015   History of knee surgery 06/22/2015   Health care maintenance 06/22/2015    PCP: Allena Hamilton, MD  REFERRING PROVIDER: Kaitlin M Picone, PA-C  REFERRING DIAG: [phone] (ICD-10-CM) - Strain of left Achilles tendon, initial encounter   Rationale for Evaluation and Treatment: Habilitation  THERAPY DIAG: Pain in left ankle and joints of left foot  Difficulty in walking, not elsewhere classified  Muscle weakness (generalized)  ONSET DATE: L Achilles tendon repair (DOS 12/13/23)   FOLLOW-UP APPT SCHEDULED WITH REFERRING PROVIDER: Yes ; in 3 weeks from initial eval, next f/u with surgeon's office  PERTINENT HISTORY: Pt is a 63 year old male s/p L Achilles tendon repair (DOS: 12/13/23). Patient reports no post-op complications. Patient reports some tenderness along Achilles tendon region. Pt has upstairs bedroom and scoots on bottom to negotiate steps.   PAIN:    Pain Intensity: Present: 0/10, Worst: 0/10 Pain location: Achilles tendon  Radiating: No  Swelling: Yes ; minor swelling on ankle Numbness/Tingling: Yes; seldom tingling in foot  History of prior back, hip, knee, or ankle injury, pain, surgery, or therapy: Yes; L ankle dislocation in 8th grade, no other significant Hx  Imaging: No  Typical footwear:  Red flags: Negative for personal history of cancer, chills/fever, night sweats, nausea, vomiting, unrelenting pain): Negative  PRECAUTIONS: Other: Weightbearing restriction and no DF beyond neutral  WEIGHT BEARING RESTRICTIONS: Yes 50 lbs PWB, increase 25 lbs each successive week starting 01/11/24  FALLS: Has patient fallen in last 6 months? 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 No sign of acute infection or delayed healing.   GAIT: Distance walked: 30 ft Assistive device utilized: Bilateral axillary crutches Level of assistance: SBA Comments: PWB LLE in bilateral axillary crutches in CAM boot only, proper use of upper limb support to offload surgical LE   AROM AROM (Normal range in degrees) AROM  01/11/24      Knee    Flexion (135) WNL WNL  Extension (0) WNL WNL      Ankle    Dorsiflexion (20)  -15  Plantarflexion (50)  40  Inversion (35)  20  Eversion (15)  WNL  (* = pain; Blank rows = not tested)   LE MMT: MMT (out of 5) Right 01/11/24 Left 01/11/24  Hip flexion 4- 4  Hip extension    Hip abduction 4+ 4+  Hip adduction    Hip internal rotation    Hip external rotation    Knee flexion 5 5  Knee extension 5 5  Ankle dorsiflexion 5 3+  Ankle plantarflexion 5 2  Ankle inversion 5 3+  Ankle eversion 5 3+  (* = pain; Blank rows = not tested)  Sensation Grossly intact to light touch throughout bilateral LEs as determined by testing dermatomes L2-S2. Proprioception, stereognosis, and hot/cold testing deferred on this date.  Reflexes Deferred  Palpation Mid-substance Achilles 1, gastroc-Achilles musculotendinous junction 1, insertional Achilles 0, calcaneal tuberosity 0 Graded on 0-4 scale (0 = no pain, 1 = pain, 2 = pain with wincing/grimacing/flinching, 3 = pain with withdrawal, 4 = unwilling to allow palpation), (Blank rows = not tested)  Passive Accessory Motion Deferred  VASCULAR Dorsalis pedis and posterior tibial pulses are  palpable Capillary refill WNL      TODAY'S TREATMENT: DATE: 02/14/2024   SUBJECTIVE STATEMENT:   Pt reports episode of pain with doffing compression socks with ice that was fleeting. He denies pain at arrival. He reports some mild redness around ankle that is worse with prolonged weightbearing. Patient reports spending up to 4 hours in AM and PM in sneaker.    Next post-op f/u early October 2025   Manual Therapy - joint mobility, ROM; prevention of motion loss during period of immobilization/CAM boot, edema control  Pt reclined: L ankle PROM within pt tolerance, DF as tolerated, no aggressive stretching; x 2 min  -PROM measurements taken; PF WNL, DF 15, INV WNL, EV WNL  Retrograde massage; x 5 min for edema control along ankle and rearfoot     Therapeutic Exercise - for improved soft tissue flexibility and extensibility as needed for ROM, improved strength as needed to improve performance of CKC  activities/functional movements   NuStep; Level 4, x 6 minutes - for improved soft tissue mobility and increased tissue temperature to improve  muscle performance    -subjective gathered during this time    Total Gym; Level 14; double-limb heel raise; 2 x 15   Seated heel raise; 2 x 10, 10-lb cuff weight resting on patient's knee   PATIENT EDUCATION: Reviewed boot wean protocol from referring office and provided new resistance band for pt.    *not today*  Seated towel scrunch; 2 x 1 min, with added 1-lb Dbell on towel Reclined ankle Tband; PF x 20 ea dir with Black Tband  Reclined ankle Tband; inversion and eversion x 10 ea dir with Black Tband   -for HEP review  -PT holding band for INV   Ankle pump; PF/DF AROM as tolerated; x20 into PF and DF Seated ankle inversion/eversion with towel; x 20 Active straight leg raise; 2 x 10   Therapeutic Activity - to improve capacity for gait with less reliance on AD, pre-gait activities to improve gait stability and adequate weight  shift  Forward to retro step along blue agility ladder; 5x D/B Toe tapping on 6-inch step to simulate side-to-side weight shifting with ambulation; 2 x 10, alt Forward gait in hallway, focus on heel to toe progression and symmetrical weight shift; x3 D/B 70-ft hallway   PATIENT EDUCATION:  Education details: see above for patient education details Person educated: Patient Education method: Explanation, Demonstration, and Handouts Education comprehension: verbalized understanding and returned demonstration   HOME EXERCISE PROGRAM:  Access Code: [email or web address] URL: [email or web address] Date: 02/07/2024 Prepared by: Venetia Endo  Exercises - Supine Ankle Inversion Eversion AROM  - 2 x daily - 7 x weekly - 2 sets - 10 reps - Towel Scrunches  - 2 x daily - 7 x weekly - 1 sets - 1-2 min hold - Long Sitting Ankle Plantar Flexion with Resistance  - 1 x daily - 7 x weekly - 2 sets - 10 reps - Long Sitting Ankle Eversion with Resistance  - 1 x daily - 7 x weekly - 2 sets - 10 reps - Seated Heel Raise  - 1-2 x daily - 7 x weekly - 2-3 sets - 10 reps - Ankle Inversion with Resistance  - 1 x daily - 7 x weekly - 2 sets - 10 reps - Active Straight Leg Raise with Quad Set  - 1 x daily - 7 x weekly - 2 sets - 10 reps   ASSESSMENT:  CLINICAL IMPRESSION: Patient is continuing to wean from use of CAM boot with more time in sneaker/out of boot with successive weeks. He exhibits normal ankle ROM at this time. We are continuing with low-intensity ankle isotonics and plantarflexor work with low weight with no aggressive calf/Achilles stretching at this time. Pt exhibits improving gait pattern and normal heel to toe progression with surgical limb. Patient has expected post-operative deficits in ankle/plantarflexor strength, post-op edema, gait stability/changes. Pt will continue to benefit from skilled PT services to address deficits and improve function.   OBJECTIVE IMPAIRMENTS: Abnormal  gait, decreased activity tolerance, decreased balance, decreased mobility, difficulty walking, decreased ROM, decreased strength, hypomobility, increased edema, impaired flexibility, and pain.   ACTIVITY LIMITATIONS: lifting, standing, squatting, stairs, transfers, and locomotion level  PARTICIPATION LIMITATIONS: driving, community activity, yard work, and Aeronautical engineer  PERSONAL FACTORS: 3 comorbidities (CVD/CAD, anxiety, dyslipidemia) are also affecting patient's functional outcome.   REHAB POTENTIAL: Good  CLINICAL DECISION MAKING: Evolving/moderate complexity  EVALUATION COMPLEXITY: Moderate   GOALS: Goals reviewed with patient? Yes  SHORT TERM GOALS: Target date: 02/01/2024  Pt will be independent with HEP  to improve strength and decrease ankle pain to improve pain-free function at home and work. Baseline: 01/11/24: Baseline home exercises reviewed.  Goal status: INITIAL  Pt will attain dorsiflexion to 10 degrees or greater by 8 weeks as needed for normalized gait pattern Baseline: 01/11/24: -15 deg DF at baseline/eval.  Goal status: INITIAL   LONG TERM GOALS: Target date: 05/13/2024  Pt will ambulate with normalized gait pattern for 660 feet or greater without reproduction of ankle pain and without AD or boot indicative of ability to perform normal community-level ambulation Baseline: 01/11/24: Ambulating home-level distance with bilateral crutches and CAM boot, partial weightbearing 25 lbs.  Goal status: INITIAL  2.  Pt will negotiate full flight of steps with reciprocal pattern and no major deviations or pain as needed for negotiating 2-story home with upstairs primary bedroom.  Baseline: 01/11/24: Pt has to scoot on stairs and use knee scooter at top/bottom of steps.  Goal status: INITIAL  3.  Pt will increase LEFS score to 60/80 or greater in order demonstrate clinically significant improvement in ankle pain/level of function.       Baseline: 01/11/24: 21/80 = 26.3% Goal  status: INITIAL  4.  Pt will increase strength of tested ankle musculature to 4+/5 or greater MMT grade in order to demonstrate improvement in strength and function  Baseline: 01/11/24:  Goal status: INITIAL   PLAN: PT FREQUENCY: 1-2x/week  PT DURATION: 16 weeks  PLANNED INTERVENTIONS: Therapeutic exercises, Therapeutic activity, Neuromuscular re-education, Balance training, Gait training, Patient/Family education, Self Care, Joint mobilization, Joint manipulation, Vestibular training, Canalith repositioning, Orthotic/Fit training, DME instructions, Dry Needling, Electrical stimulation, Spinal manipulation, Spinal mobilization, Cryotherapy, Moist heat, Taping, Traction, Ultrasound, Ionotophoresis 4mg /ml Dexamethasone, Manual therapy, and Re-evaluation.  PLAN FOR NEXT SESSION: WBAT in CAM boot/sneaker at this time. Pt continuing with 5-week boot wean progression. Progress weightbearing activity in sneaker with successive visits as pt progresses with boot wean protocol. [place] Ankle Tband exercise, gradual progression of resisted plantarflexion with initial heel raises with bodyweight partially eliminated.    Venetia Endo, PT, DPT #[id number]  Venetia ONEIDA Endo, PT 02/14/2024, 10:36 AM

## 2024-02-15 ENCOUNTER — Ambulatory Visit: Payer: Self-pay | Admitting: Internal Medicine

## 2024-02-15 DIAGNOSIS — E871 Hypo-osmolality and hyponatremia: Secondary | ICD-10-CM

## 2024-02-17 LAB — BASIC METABOLIC PANEL WITH GFR
BUN: 19 mg/dL (ref 6–23)
CO2: 28 meq/L (ref 19–32)
Calcium: 9.5 mg/dL (ref 8.4–10.5)
Chloride: 106 meq/L (ref 96–112)
Creatinine, Ser: 0.92 mg/dL (ref 0.40–1.50)
GFR: 88.07 mL/min (ref >60.00)
Glucose, Bld: 90 mg/dL (ref 70–99)
Potassium: 4.2 meq/L (ref 3.5–5.1)
Sodium: 138 meq/L (ref 135–145)

## 2024-02-18 ENCOUNTER — Encounter: Payer: Self-pay | Admitting: Internal Medicine

## 2024-02-18 NOTE — Assessment & Plan Note (Addendum)
 Continue crestor . Low cholesterol diet and exercise. Follow lipid panel and liver function tests.  Lab Results  Component Value Date   CHOL 141 02/13/2024   HDL 38.20 (L) 02/13/2024   LDLCALC 79 02/13/2024   TRIG 115.0 02/13/2024   CHOLHDL 4 02/13/2024

## 2024-02-18 NOTE — Assessment & Plan Note (Signed)
 Seeing ortho after suffering achilles tendon rupture. S/p mini open achilles repair - going to PT. Last seen 01/25/24 - recommended to continue PT, gradually wean off crutches and the boot as comfortable. Recommended compression socks.

## 2024-02-18 NOTE — Assessment & Plan Note (Signed)
Colonoscopy 07/2015.  Recommended f/u colonoscopy in 10 years.   

## 2024-02-18 NOTE — Assessment & Plan Note (Signed)
 Increased stress as outlined. Discussed counseling. Information given. Follow

## 2024-02-18 NOTE — Assessment & Plan Note (Signed)
 Still noticing intermittently.  Off plavix .  MRI prostate ok.  Saw urology.  Discussed possibility of prostatitis.  Was given rx for finasteride.  Elects not to take at this time.  D/w urology regarding f/u.

## 2024-02-18 NOTE — Assessment & Plan Note (Signed)
 S/p stent LAD x 2.  Continue risk factor modification.  Continue f/u with cardiology.  Continue crestor . Stable. No chest pain.

## 2024-02-18 NOTE — Assessment & Plan Note (Signed)
 Saw cardiology 05/11/23 - s/p PCI to LAD in 12/2015. Felt stable. Recommended continuing crestor  and daily aspirin . Also recommended annual echo to follow thoracic aortic aneurysm. Reports is scheduled.

## 2024-02-19 ENCOUNTER — Encounter: Admitting: Physical Therapy

## 2024-02-21 ENCOUNTER — Ambulatory Visit: Admitting: Physical Therapy

## 2024-02-21 ENCOUNTER — Encounter: Payer: Self-pay | Admitting: Physical Therapy

## 2024-02-21 DIAGNOSIS — M6281 Muscle weakness (generalized): Secondary | ICD-10-CM

## 2024-02-21 DIAGNOSIS — R262 Difficulty in walking, not elsewhere classified: Secondary | ICD-10-CM

## 2024-02-21 DIAGNOSIS — M25572 Pain in left ankle and joints of left foot: Secondary | ICD-10-CM

## 2024-02-21 NOTE — Therapy (Signed)
 OUTPATIENT PHYSICAL THERAPY ANKLE POST-OP TREATMENT   Patient Name: Jake Wagner MRN: [id number] DOB:Nov 11, 1960, 63 y.o., male Today's Date: 02/21/2024  END OF SESSION:  PT End of Session - 02/25/24 2223     Visit Number 9    Number of Visits 21    Date for Recertification  03/21/24    PT Start Time 0952    PT Stop Time 1032    PT Time Calculation (min) 40 min    Equipment Utilized During Treatment --   bilateral axillary crutches, L CAM boot   Activity Tolerance Patient tolerated treatment well    Behavior During Therapy WFL for tasks assessed/performed          Past Medical History:  Diagnosis Date   Anginal pain    History of chicken pox    Hypercholesteremia    Past Surgical History:  Procedure Laterality Date   CARDIAC CATHETERIZATION Left 01/07/2016   Procedure: Left Heart Cath and Coronary Angiography;  Surgeon: Cara JONETTA Lovelace, MD;  Location: [facility];  Service: Cardiovascular;  Laterality: Left;   Patient Active Problem List   Diagnosis Date Noted   Achilles tendon tear 12/18/2023   Right knee pain 04/14/2023   History of pneumonia 02/01/2023   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 04/29/2022   History of COVID-19 08/01/2021   Left shoulder pain 07/27/2021   Respiratory illness 07/12/2021   Acute cough 05/20/2021   Erectile dysfunction 01/30/2021   Right ankle pain 03/12/2020   Stress 10/21/2018   Rash 10/21/2018   Blood in semen 10/21/2018   Memory change 01/14/2018   Hemorrhoid 01/14/2018   History of malignant melanoma of skin 06/15/2017   History of melanoma in situ 05/30/2017   Aortic aneurysm 10/22/2016   Chronic right shoulder pain 05/18/2016   Diastolic dysfunction 04/27/2016   Dilated aortic root 04/27/2016   Adhesive capsulitis of right shoulder 03/16/2016   Light headedness 02/21/2016   Right shoulder pain 02/18/2016   Fatigue 10/14/2015   CAD (coronary artery disease) 10/14/2015   History of colonic polyps  08/19/2015   Hypercholesterolemia 06/22/2015   History of knee surgery 06/22/2015   Health care maintenance 06/22/2015    PCP: Allena Hamilton, MD  REFERRING PROVIDER: Kaitlin M Picone, PA-C  REFERRING DIAG: [phone] (ICD-10-CM) - Strain of left Achilles tendon, initial encounter   Rationale for Evaluation and Treatment: Habilitation  THERAPY DIAG: Pain in left ankle and joints of left foot  Difficulty in walking, not elsewhere classified  Muscle weakness (generalized)  ONSET DATE: L Achilles tendon repair (DOS 12/13/23)   FOLLOW-UP APPT SCHEDULED WITH REFERRING PROVIDER: Yes ; in 3 weeks from initial eval, next f/u with surgeon's office  PERTINENT HISTORY: Pt is a 63 year old male s/p L Achilles tendon repair (DOS: 12/13/23). Patient reports no post-op complications. Patient reports some tenderness along Achilles tendon region. Pt has upstairs bedroom and scoots on bottom to negotiate steps.   PAIN:    Pain Intensity: Present: 0/10, Worst: 0/10 Pain location: Achilles tendon  Radiating: No  Swelling: Yes ; minor swelling on ankle Numbness/Tingling: Yes; seldom tingling in foot  History of prior back, hip, knee, or ankle injury, pain, surgery, or therapy: Yes; L ankle dislocation in 8th grade, no other significant Hx  Imaging: No  Typical footwear:  Red flags: Negative for personal history of cancer, chills/fever, night sweats, nausea, vomiting, unrelenting pain): Negative  PRECAUTIONS: Other: Weightbearing restriction and no DF beyond neutral   WEIGHT BEARING RESTRICTIONS: Yes 50  lbs PWB, increase 25 lbs each successive week starting 01/11/24  FALLS: Has patient fallen in last 6 months? No  Living Environment Lives with: lives with their spouse and brother-in-law Lives in: House/apartment Stairs: Yes: Internal: 16 steps; on left going up and External: 3 steps; on right going up (going into garage) Has following equipment at home: Crutches, knee scooter   Prior level of  function: Independent  Occupational demands: Photography business  Hobbies: Working on yard, Psychologist, clinical  Patient Goals: Return to baseline    OBJECTIVE (data from initial evaluation unless otherwise dated):   Patient Surveys  LEFS: 21/80 = 26.3%   Gross Musculoskeletal Assessment Bulk: Normal Tone: Normal Moderate ankle edema, mild erythema along incision and ankle. No sign of acute infection or delayed healing.   GAIT: Distance walked: 30 ft Assistive device utilized: Bilateral axillary crutches Level of assistance: SBA Comments: PWB LLE in bilateral axillary crutches in CAM boot only, proper use of upper limb support to offload surgical LE   AROM AROM (Normal range in degrees) AROM  01/11/24      Knee    Flexion (135) WNL WNL  Extension (0) WNL WNL      Ankle    Dorsiflexion (20)  -15  Plantarflexion (50)  40  Inversion (35)  20  Eversion (15)  WNL  (* = pain; Blank rows = not tested)   LE MMT: MMT (out of 5) Right 01/11/24 Left 01/11/24  Hip flexion 4- 4  Hip extension    Hip abduction 4+ 4+  Hip adduction    Hip internal rotation    Hip external rotation    Knee flexion 5 5  Knee extension 5 5  Ankle dorsiflexion 5 3+  Ankle plantarflexion 5 2  Ankle inversion 5 3+  Ankle eversion 5 3+  (* = pain; Blank rows = not tested)  Sensation Grossly intact to light touch throughout bilateral LEs as determined by testing dermatomes L2-S2. Proprioception, stereognosis, and hot/cold testing deferred on this date.  Reflexes Deferred  Palpation Mid-substance Achilles 1, gastroc-Achilles musculotendinous junction 1, insertional Achilles 0, calcaneal tuberosity 0 Graded on 0-4 scale (0 = no pain, 1 = pain, 2 = pain with wincing/grimacing/flinching, 3 = pain with withdrawal, 4 = unwilling to allow palpation), (Blank rows = not tested)  Passive Accessory Motion Deferred  VASCULAR Dorsalis pedis and posterior tibial pulses are palpable Capillary refill WNL       TODAY'S TREATMENT: DATE: 02/21/2024   SUBJECTIVE STATEMENT:   Pt reports intermittent heel soreness from prolonged standing in sneaker. Pt is weaned from CAM boot at this time. Patient follows up with surgeon tomorrow morning.    Manual Therapy - joint mobility, ROM; prevention of motion loss during period of immobilization/CAM boot, edema control  Pt reclined: L ankle PROM within pt tolerance, DF as tolerated, no aggressive stretching; x 2 min  -PROM measurements taken; PF WNL, DF 15, INV WNL, EV WNL  Retrograde massage; x 5 min for edema control along ankle and rearfoot     Therapeutic Exercise - for improved soft tissue flexibility and extensibility as needed for ROM, improved strength as needed to improve performance of CKC  activities/functional movements   NuStep; Level 4, x 6 minutes - for improved soft tissue mobility and increased tissue temperature to improve muscle performance    -subjective gathered during this time    Seated heel raise; 2 x 12, 10-lb cuff weight resting on patient's knee   Total Gym; Level 18;  double-limb heel raise; 2 x 15   Tandem stance, bilat; x 30 sec on floor and on Airex pad  PATIENT EDUCATION: Reviewed boot wean protocol from referring office and provided new resistance band for pt.    *not today*  Seated towel scrunch; 2 x 1 min, with added 1-lb Dbell on towel Reclined ankle Tband; PF x 20 ea dir with Black Tband  Reclined ankle Tband; inversion and eversion x 10 ea dir with Black Tband   -for HEP review  -PT holding band for INV   Ankle pump; PF/DF AROM as tolerated; x20 into PF and DF Seated ankle inversion/eversion with towel; x 20 Active straight leg raise; 2 x 10   Therapeutic Activity - to improve capacity for gait with less reliance on AD, pre-gait activities to improve gait stability and adequate weight shift  Forward to retro step along blue agility ladder; 5x D/B Toe tapping on 6-inch step to simulate side-to-side  weight shifting with ambulation; 2 x 10, alt Minisquat; 2 x 10, with mirror feedback for technique Forward step-up; 6-inch step, staircase in center of gym; x 15  *not today* Forward gait in hallway, focus on heel to toe progression and symmetrical weight shift; x3 D/B 70-ft hallway   PATIENT EDUCATION:  Education details: see above for patient education details Person educated: Patient Education method: Explanation, Demonstration, and Handouts Education comprehension: verbalized understanding and returned demonstration   HOME EXERCISE PROGRAM:  Access Code: [email or web address] URL: [email or web address] Date: 02/07/2024 Prepared by: Venetia Endo  Exercises - Supine Ankle Inversion Eversion AROM  - 2 x daily - 7 x weekly - 2 sets - 10 reps - Towel Scrunches  - 2 x daily - 7 x weekly - 1 sets - 1-2 min hold - Long Sitting Ankle Plantar Flexion with Resistance  - 1 x daily - 7 x weekly - 2 sets - 10 reps - Long Sitting Ankle Eversion with Resistance  - 1 x daily - 7 x weekly - 2 sets - 10 reps - Seated Heel Raise  - 1-2 x daily - 7 x weekly - 2-3 sets - 10 reps - Ankle Inversion with Resistance  - 1 x daily - 7 x weekly - 2 sets - 10 reps - Active Straight Leg Raise with Quad Set  - 1 x daily - 7 x weekly - 2 sets - 10 reps   ASSESSMENT:  CLINICAL IMPRESSION: Patient is largely weaned from his CAM boot at this time and is awaiting f/u with his surgeon tomorrow. He demonstrates good ankle ROM and minimal gait deviations at this time. We are continuing to progress with plantarflexor and closed-chain strengthening per protocol. Patient has expected post-operative deficits in ankle/plantarflexor strength, post-op edema, gait stability/changes. Pt will continue to benefit from skilled PT services to address deficits and improve function.   OBJECTIVE IMPAIRMENTS: Abnormal gait, decreased activity tolerance, decreased balance, decreased mobility, difficulty walking, decreased ROM,  decreased strength, hypomobility, increased edema, impaired flexibility, and pain.   ACTIVITY LIMITATIONS: lifting, standing, squatting, stairs, transfers, and locomotion level  PARTICIPATION LIMITATIONS: driving, community activity, yard work, and Aeronautical engineer  PERSONAL FACTORS: 3 comorbidities (CVD/CAD, anxiety, dyslipidemia) are also affecting patient's functional outcome.   REHAB POTENTIAL: Good  CLINICAL DECISION MAKING: Evolving/moderate complexity  EVALUATION COMPLEXITY: Moderate   GOALS: Goals reviewed with patient? Yes  SHORT TERM GOALS: Target date: 02/01/2024  Pt will be independent with HEP to improve strength and decrease ankle pain to improve pain-free function at home and work. Baseline:  01/11/24: Baseline home exercises reviewed.  Goal status: INITIAL  Pt will attain dorsiflexion to 10 degrees or greater by 8 weeks as needed for normalized gait pattern Baseline: 01/11/24: -15 deg DF at baseline/eval.  Goal status: INITIAL   LONG TERM GOALS: Target date: 05/13/2024  Pt will ambulate with normalized gait pattern for 660 feet or greater without reproduction of ankle pain and without AD or boot indicative of ability to perform normal community-level ambulation Baseline: 01/11/24: Ambulating home-level distance with bilateral crutches and CAM boot, partial weightbearing 25 lbs.  Goal status: INITIAL  2.  Pt will negotiate full flight of steps with reciprocal pattern and no major deviations or pain as needed for negotiating 2-story home with upstairs primary bedroom.  Baseline: 01/11/24: Pt has to scoot on stairs and use knee scooter at top/bottom of steps.  Goal status: INITIAL  3.  Pt will increase LEFS score to 60/80 or greater in order demonstrate clinically significant improvement in ankle pain/level of function.       Baseline: 01/11/24: 21/80 = 26.3% Goal status: INITIAL  4.  Pt will increase strength of tested ankle musculature to 4+/5 or greater MMT grade in  order to demonstrate improvement in strength and function  Baseline: 01/11/24:  Goal status: INITIAL   PLAN: PT FREQUENCY: 1-2x/week  PT DURATION: 16 weeks  PLANNED INTERVENTIONS: Therapeutic exercises, Therapeutic activity, Neuromuscular re-education, Balance training, Gait training, Patient/Family education, Self Care, Joint mobilization, Joint manipulation, Vestibular training, Canalith repositioning, Orthotic/Fit training, DME instructions, Dry Needling, Electrical stimulation, Spinal manipulation, Spinal mobilization, Cryotherapy, Moist heat, Taping, Traction, Ultrasound, Ionotophoresis 4mg /ml Dexamethasone, Manual therapy, and Re-evaluation.  PLAN FOR NEXT SESSION: WBAT in CAM boot/sneaker at this time. Pt continuing with 5-week boot wean progression. Progress weightbearing activity in sneaker with successive visits as pt progresses with boot wean protocol. [place] Ankle Tband exercise, gradual progression of resisted plantarflexion with initial heel raises with bodyweight partially eliminated.    Venetia Endo, PT, DPT #[id number]  Venetia ONEIDA Endo, PT 02/25/2024, 10:24 PM

## 2024-02-26 ENCOUNTER — Ambulatory Visit: Admitting: Physical Therapy

## 2024-02-26 ENCOUNTER — Encounter: Admitting: Physical Therapy

## 2024-02-26 ENCOUNTER — Encounter: Payer: Self-pay | Admitting: Physical Therapy

## 2024-02-26 DIAGNOSIS — M25572 Pain in left ankle and joints of left foot: Secondary | ICD-10-CM

## 2024-02-26 DIAGNOSIS — M6281 Muscle weakness (generalized): Secondary | ICD-10-CM

## 2024-02-26 DIAGNOSIS — R262 Difficulty in walking, not elsewhere classified: Secondary | ICD-10-CM

## 2024-02-26 NOTE — Therapy (Signed)
 OUTPATIENT PHYSICAL THERAPY ANKLE POST-OP TREATMENT AND PROGRESS NOTE   Dates of reporting period  01/11/24   to   02/26/24  Patient Name: Jake Wagner MRN: [id number] DOB:09/29/1960, 63 y.o., male Today's Date: 02/26/2024  END OF SESSION:  PT End of Session - 02/26/24 1030     Visit Number 10    Number of Visits 21    Date for Recertification  03/21/24    PT Start Time 1030    PT Stop Time 1113    PT Time Calculation (min) 43 min    Equipment Utilized During Treatment --   bilateral axillary crutches, L CAM boot   Activity Tolerance Patient tolerated treatment well    Behavior During Therapy WFL for tasks assessed/performed           Past Medical History:  Diagnosis Date   Anginal pain    History of chicken pox    Hypercholesteremia    Past Surgical History:  Procedure Laterality Date   CARDIAC CATHETERIZATION Left 01/07/2016   Procedure: Left Heart Cath and Coronary Angiography;  Surgeon: Cara JONETTA Lovelace, MD;  Location: [facility];  Service: Cardiovascular;  Laterality: Left;   Patient Active Problem List   Diagnosis Date Noted   Achilles tendon tear 12/18/2023   Right knee pain 04/14/2023   History of pneumonia 02/01/2023   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 04/29/2022   History of COVID-19 08/01/2021   Left shoulder pain 07/27/2021   Respiratory illness 07/12/2021   Acute cough 05/20/2021   Erectile dysfunction 01/30/2021   Right ankle pain 03/12/2020   Stress 10/21/2018   Rash 10/21/2018   Blood in semen 10/21/2018   Memory change 01/14/2018   Hemorrhoid 01/14/2018   History of malignant melanoma of skin 06/15/2017   History of melanoma in situ 05/30/2017   Aortic aneurysm 10/22/2016   Chronic right shoulder pain 05/18/2016   Diastolic dysfunction 04/27/2016   Dilated aortic root 04/27/2016   Adhesive capsulitis of right shoulder 03/16/2016   Light headedness 02/21/2016   Right shoulder pain 02/18/2016   Fatigue 10/14/2015   CAD  (coronary artery disease) 10/14/2015   History of colonic polyps 08/19/2015   Hypercholesterolemia 06/22/2015   History of knee surgery 06/22/2015   Health care maintenance 06/22/2015    PCP: Allena Hamilton, MD  REFERRING PROVIDER: Kaitlin M Picone, PA-C  REFERRING DIAG: [phone] (ICD-10-CM) - Strain of left Achilles tendon, initial encounter   Rationale for Evaluation and Treatment: Habilitation  THERAPY DIAG: Pain in left ankle and joints of left foot  Difficulty in walking, not elsewhere classified  Muscle weakness (generalized)  ONSET DATE: L Achilles tendon repair (DOS 12/13/23)   FOLLOW-UP APPT SCHEDULED WITH REFERRING PROVIDER: Yes ; in 3 weeks from initial eval, next f/u with surgeon's office  PERTINENT HISTORY: Pt is a 63 year old male s/p L Achilles tendon repair (DOS: 12/13/23). Patient reports no post-op complications. Patient reports some tenderness along Achilles tendon region. Pt has upstairs bedroom and scoots on bottom to negotiate steps.   PAIN:    Pain Intensity: Present: 0/10, Worst: 0/10 Pain location: Achilles tendon  Radiating: No  Swelling: Yes ; minor swelling on ankle Numbness/Tingling: Yes; seldom tingling in foot  History of prior back, hip, knee, or ankle injury, pain, surgery, or therapy: Yes; L ankle dislocation in 8th grade, no other significant Hx  Imaging: No  Typical footwear:  Red flags: Negative for personal history of cancer, chills/fever, night sweats, nausea, vomiting, unrelenting pain): Negative  PRECAUTIONS: Other: Weightbearing restriction and no DF beyond neutral   WEIGHT BEARING RESTRICTIONS: Yes 50 lbs PWB, increase 25 lbs each successive week starting 01/11/24  FALLS: Has patient fallen in last 6 months? No  Living Environment Lives with: lives with their spouse and brother-in-law Lives in: House/apartment Stairs: Yes: Internal: 16 steps; on left going up and External: 3 steps; on right going up (going into garage) Has  following equipment at home: Crutches, knee scooter   Prior level of function: Independent  Occupational demands: Photography business  Hobbies: Working on yard, Psychologist, clinical  Patient Goals: Return to baseline    OBJECTIVE (data from initial evaluation unless otherwise dated):   Patient Surveys  LEFS: 21/80 = 26.3%   Gross Musculoskeletal Assessment Bulk: Normal Tone: Normal Moderate ankle edema, mild erythema along incision and ankle. No sign of acute infection or delayed healing.   GAIT: Distance walked: 30 ft Assistive device utilized: Bilateral axillary crutches Level of assistance: SBA Comments: PWB LLE in bilateral axillary crutches in CAM boot only, proper use of upper limb support to offload surgical LE   AROM AROM (Normal range in degrees) AROM  01/11/24 AROM 02/26/24   Right Left Left  Knee     Flexion (135) WNL WNL   Extension (0) WNL WNL        Ankle     Dorsiflexion (20)  -15 12 (18 passively)  Plantarflexion (50)  40 WNL  Inversion (35)  20 35  Eversion (15)  WNL WNL  (* = pain; Blank rows = not tested)   LE MMT: MMT (out of 5) Right 01/11/24 Left 01/11/24 Right 02/26/24 Left 02/26/24  Hip flexion 4- 4    Hip extension      Hip abduction 4+ 4+    Hip adduction      Hip internal rotation      Hip external rotation      Knee flexion 5 5    Knee extension 5 5    Ankle dorsiflexion 5 3+  5  Ankle plantarflexion 5 2  3+ (no single- limb heel raise yet)  Ankle inversion 5 3+  4  Ankle eversion 5 3+  4+  (* = pain; Blank rows = not tested)  Sensation Grossly intact to light touch throughout bilateral LEs as determined by testing dermatomes L2-S2. Proprioception, stereognosis, and hot/cold testing deferred on this date.  Reflexes Deferred  Palpation Mid-substance Achilles 1, gastroc-Achilles musculotendinous junction 1, insertional Achilles 0, calcaneal tuberosity 0 Graded on 0-4 scale (0 = no pain, 1 = pain, 2 = pain with  wincing/grimacing/flinching, 3 = pain with withdrawal, 4 = unwilling to allow palpation), (Blank rows = not tested)  Passive Accessory Motion Deferred  VASCULAR Dorsalis pedis and posterior tibial pulses are palpable Capillary refill WNL      TODAY'S TREATMENT: DATE: 02/26/2024  SUBJECTIVE STATEMENT:   Pt reports no notable pain at arrival. Pt feels he Is at 65-70% global rating of function at this time. Patient reports some difficulty with seated heel raise. He reports getting around home and outdoor environment well overall - he states he is cautious on uneven terrain.    Therapeutic Exercise - for improved soft tissue flexibility and extensibility as needed for ROM, improved strength as needed to improve performance of CKC  activities/functional movements   NuStep; Level 5, x 6 minutes - for improved soft tissue mobility and increased tissue temperature to improve muscle performance    -subjective gathered during this time  Seated heel raise; 2 x 12, 10-lb cuff weight resting on patient's knee   Total Gym; Level 20; double-limb heel raise; 3 x 10   Single limb stance; 2 x 30 sec   *not today*  Seated towel scrunch; 2 x 1 min, with added 1-lb Dbell on towel Reclined ankle Tband; PF x 20 ea dir with Black Tband  Reclined ankle Tband; inversion and eversion x 10 ea dir with Black Tband   -for HEP review  -PT holding band for INV   Ankle pump; PF/DF AROM as tolerated; x20 into PF and DF Seated ankle inversion/eversion with towel; x 20 Active straight leg raise; 2 x 10    Therapeutic Activity - to improve capacity for gait with less reliance on AD, pre-gait activities to improve gait stability and adequate weight shift   *GOAL UPDATE PERFORMED  Minisquat; 2 x 10, with mirror feedback for technique Forward step-up; 6-inch step, staircase in center of gym; x 15  PATIENT EDUCATION: Discussed current progress, prognosis, POC.    *not today* Forward to retro step along  blue agility ladder; 5x D/B Toe tapping on 6-inch step to simulate side-to-side weight shifting with ambulation; 2 x 10, alt Forward gait in hallway, focus on heel to toe progression and symmetrical weight shift; x3 D/B 70-ft hallway  PATIENT EDUCATION:  Education details: see above for patient education details Person educated: Patient Education method: Explanation, Demonstration, and Handouts Education comprehension: verbalized understanding and returned demonstration   HOME EXERCISE PROGRAM:  Access Code: [email or web address] URL: [email or web address] Date: 02/07/2024 Prepared by: Venetia Endo  Exercises - Supine Ankle Inversion Eversion AROM  - 2 x daily - 7 x weekly - 2 sets - 10 reps - Towel Scrunches  - 2 x daily - 7 x weekly - 1 sets - 1-2 min hold - Long Sitting Ankle Plantar Flexion with Resistance  - 1 x daily - 7 x weekly - 2 sets - 10 reps - Long Sitting Ankle Eversion with Resistance  - 1 x daily - 7 x weekly - 2 sets - 10 reps - Seated Heel Raise  - 1-2 x daily - 7 x weekly - 2-3 sets - 10 reps - Ankle Inversion with Resistance  - 1 x daily - 7 x weekly - 2 sets - 10 reps - Active Straight Leg Raise with Quad Set  - 1 x daily - 7 x weekly - 2 sets - 10 reps   ASSESSMENT:  CLINICAL IMPRESSION: Patient is essentially fully weaned from his CAM boot at this time and had has been cleared from physician for continued gait without CAM boot unless he needs it for discomfort with gait. He demonstrates good ankle ROM and minimal gait deviations at this time. We are continuing to progress with plantarflexor and closed-chain strengthening per protocol. Pt exhibits good postural control relative to age and post-op timeframe. His LEFS has significantly improved, but he has not yet met 60/80 long-term goal. Pt is functional with stair negotiation and community-level gait, though gait deviations do emerge after prolonged ambulation. Patient has expected post-operative deficits in  ankle/plantarflexor strength, post-op edema, gait stability/changes. Pt will continue to benefit from skilled PT services to address deficits and improve function.   OBJECTIVE IMPAIRMENTS: Abnormal gait, decreased activity tolerance, decreased balance, decreased mobility, difficulty walking, decreased ROM, decreased strength, hypomobility, increased edema, impaired flexibility, and pain.   ACTIVITY LIMITATIONS: lifting, standing, squatting, stairs, transfers, and locomotion level  PARTICIPATION LIMITATIONS: driving, community activity, yard work, and Aeronautical engineer  PERSONAL FACTORS: 3 comorbidities (CVD/CAD, anxiety, dyslipidemia) are also affecting patient's functional outcome.   REHAB POTENTIAL: Good  CLINICAL DECISION MAKING: Evolving/moderate complexity  EVALUATION COMPLEXITY: Moderate   GOALS: Goals reviewed with patient? Yes  SHORT TERM GOALS: Target date: 02/01/2024  Pt will be independent with HEP to improve strength and decrease ankle pain to improve pain-free function at home and work. Baseline: 01/11/24: Baseline home exercises reviewed.      02/26/24: Pt recounts exercise well and is maintaining HEP.  Goal status: ACHIEVED  Pt will attain dorsiflexion to 10 degrees or greater by 8 weeks as needed for normalized gait pattern Baseline: 01/11/24: -15 deg DF at baseline/eval.     02/26/24: DF > 10 deg Goal status: ACHIEVED   LONG TERM GOALS: Target date: 05/13/2024  Pt will ambulate with normalized gait pattern for 660 feet or greater without reproduction of ankle pain and without AD or boot indicative of ability to perform normal community-level ambulation Baseline: 01/11/24: Ambulating home-level distance with bilateral crutches and CAM boot, partial weightbearing 25 lbs.     02/26/24: Pt is able to ambulate at community-level distance with moderate gait deviation/antalgic pattern during last 1-2 laps.  Goal status: IN PROGRESS/ON-GOING  2.  Pt will negotiate full flight of  steps with reciprocal pattern and no major deviations or pain as needed for negotiating 2-story home with upstairs primary bedroom.  Baseline: 01/11/24: Pt has to scoot on stairs and use knee scooter at top/bottom of steps.     02/26/24: Pt able to complete reciprocal stair climb/descent safety.  Goal status: ACHIEVED   3.  Pt will increase LEFS score to 60/80 or greater in order demonstrate clinically significant improvement in ankle pain/level of function.       Baseline: 01/11/24: 21/80 = 26.3%    02/26/24: 43/80 = 53.8% Goal status: IN PROGRESS  4.  Pt will increase strength of tested ankle musculature to 4+/5 or greater MMT grade in order to demonstrate improvement in strength and function  Baseline: 01/11/24: 2 to 3+ ankle strength per MMT (see chart above).        02/26/24: Met for dorsiflexion and eversion, not met for plantarflexors and inversion.  Goal status: IN PROGRESS   PLAN: PT FREQUENCY: 1-2x/week  PT DURATION: 6-8 weeks  PLANNED INTERVENTIONS: Therapeutic exercises, Therapeutic activity, Neuromuscular re-education, Balance training, Gait training, Patient/Family education, Self Care, Joint mobilization, Joint manipulation, Vestibular training, Canalith repositioning, Orthotic/Fit training, DME instructions, Dry Needling, Electrical stimulation, Spinal manipulation, Spinal mobilization, Cryotherapy, Moist heat, Taping, Traction, Ultrasound, Ionotophoresis 4mg /ml Dexamethasone, Manual therapy, and Re-evaluation.  PLAN FOR NEXT SESSION: Progressive CKC loading, double-limb calf raise progression, balance and proprioceptive drills. Progress as tolerated per protocol.    Venetia Endo, PT, DPT #[id number]  Venetia ONEIDA Endo, PT 02/26/2024, 12:41 PM

## 2024-02-27 NOTE — Therapy (Unsigned)
 OUTPATIENT PHYSICAL THERAPY ANKLE POST-OP TREATMENT  Patient Name: Jake Wagner MRN: [id number] DOB:[birth year], 63 y.o., male Today's Date: 02/27/2024  END OF SESSION:     Past Medical History:  Diagnosis Date   Anginal pain    History of chicken pox    Hypercholesteremia    Past Surgical History:  Procedure Laterality Date   CARDIAC CATHETERIZATION Left 01/07/2016   Procedure: Left Heart Cath and Coronary Angiography;  Surgeon: Cara JONETTA Lovelace, MD;  Location: [facility];  Service: Cardiovascular;  Laterality: Left;   Patient Active Problem List   Diagnosis Date Noted   Achilles tendon tear 12/18/2023   Right knee pain 04/14/2023   History of pneumonia 02/01/2023   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 04/29/2022   History of COVID-19 08/01/2021   Left shoulder pain 07/27/2021   Respiratory illness 07/12/2021   Acute cough 05/20/2021   Erectile dysfunction 01/30/2021   Right ankle pain 03/12/2020   Stress 10/21/2018   Rash 10/21/2018   Blood in semen 10/21/2018   Memory change 01/14/2018   Hemorrhoid 01/14/2018   History of malignant melanoma of skin 06/15/2017   History of melanoma in situ 05/30/2017   Aortic aneurysm 10/22/2016   Chronic right shoulder pain 05/18/2016   Diastolic dysfunction 04/27/2016   Dilated aortic root 04/27/2016   Adhesive capsulitis of right shoulder 03/16/2016   Light headedness 02/21/2016   Right shoulder pain 02/18/2016   Fatigue 10/14/2015   CAD (coronary artery disease) 10/14/2015   History of colonic polyps 08/19/2015   Hypercholesterolemia 06/22/2015   History of knee surgery 06/22/2015   Health care maintenance 06/22/2015    PCP: Allena Hamilton, MD  REFERRING PROVIDER: Kaitlin M Picone, PA-C  REFERRING DIAG: [phone] (ICD-10-CM) - Strain of left Achilles tendon, initial encounter   Rationale for Evaluation and Treatment: Habilitation  THERAPY DIAG: Pain in left ankle and joints of left foot  Difficulty  in walking, not elsewhere classified  Muscle weakness (generalized)  ONSET DATE: L Achilles tendon repair (DOS 12/13/23)   FOLLOW-UP APPT SCHEDULED WITH REFERRING PROVIDER: Yes ; in 3 weeks from initial eval, next f/u with surgeon's office  PERTINENT HISTORY: Pt is a 63 year old male s/p L Achilles tendon repair (DOS: 12/13/23). Patient reports no post-op complications. Patient reports some tenderness along Achilles tendon region. Pt has upstairs bedroom and scoots on bottom to negotiate steps.   PAIN:    Pain Intensity: Present: 0/10, Worst: 0/10 Pain location: Achilles tendon  Radiating: No  Swelling: Yes ; minor swelling on ankle Numbness/Tingling: Yes; seldom tingling in foot  History of prior back, hip, knee, or ankle injury, pain, surgery, or therapy: Yes; L ankle dislocation in 8th grade, no other significant Hx  Imaging: No  Typical footwear:  Red flags: Negative for personal history of cancer, chills/fever, night sweats, nausea, vomiting, unrelenting pain): Negative  PRECAUTIONS: Other: Weightbearing restriction and no DF beyond neutral   WEIGHT BEARING RESTRICTIONS: Yes 50 lbs PWB, increase 25 lbs each successive week starting 01/11/24  FALLS: Has patient fallen in last 6 months? No  Living Environment Lives with: lives with their spouse and brother-in-law Lives in: House/apartment Stairs: Yes: Internal: 16 steps; on left going up and External: 3 steps; on right going up (going into garage) Has following equipment at home: Crutches, knee scooter   Prior level of function: Independent  Occupational demands: Photography business  Hobbies: Working on yard, landscape  Patient Goals: Return to baseline    OBJECTIVE (data from initial  evaluation unless otherwise dated):   Patient Surveys  LEFS: 21/80 = 26.3%   Gross Musculoskeletal Assessment Bulk: Normal Tone: Normal Moderate ankle edema, mild erythema along incision and ankle. No sign of acute infection or  delayed healing.   GAIT: Distance walked: 30 ft Assistive device utilized: Bilateral axillary crutches Level of assistance: SBA Comments: PWB LLE in bilateral axillary crutches in CAM boot only, proper use of upper limb support to offload surgical LE   AROM AROM (Normal range in degrees) AROM  01/11/24 AROM 02/26/24   Right Left Left  Knee     Flexion (135) WNL WNL   Extension (0) WNL WNL        Ankle     Dorsiflexion (20)  -15 12 (18 passively)  Plantarflexion (50)  40 WNL  Inversion (35)  20 35  Eversion (15)  WNL WNL  (* = pain; Blank rows = not tested)   LE MMT: MMT (out of 5) Right 01/11/24 Left 01/11/24 Right 02/26/24 Left 02/26/24  Hip flexion 4- 4    Hip extension      Hip abduction 4+ 4+    Hip adduction      Hip internal rotation      Hip external rotation      Knee flexion 5 5    Knee extension 5 5    Ankle dorsiflexion 5 3+  5  Ankle plantarflexion 5 2  3+ (no single- limb heel raise yet)  Ankle inversion 5 3+  4  Ankle eversion 5 3+  4+  (* = pain; Blank rows = not tested)  Sensation Grossly intact to light touch throughout bilateral LEs as determined by testing dermatomes L2-S2. Proprioception, stereognosis, and hot/cold testing deferred on this date.  Reflexes Deferred  Palpation Mid-substance Achilles 1, gastroc-Achilles musculotendinous junction 1, insertional Achilles 0, calcaneal tuberosity 0 Graded on 0-4 scale (0 = no pain, 1 = pain, 2 = pain with wincing/grimacing/flinching, 3 = pain with withdrawal, 4 = unwilling to allow palpation), (Blank rows = not tested)  Passive Accessory Motion Deferred  VASCULAR Dorsalis pedis and posterior tibial pulses are palpable Capillary refill WNL      TODAY'S TREATMENT: DATE: 02/27/2024  SUBJECTIVE STATEMENT:   Pt reports no notable pain at arrival. Pt feels he Is at 65-70% global rating of function at this time. Patient reports some difficulty with seated heel raise. He reports getting around home  and outdoor environment well overall - he states he is cautious on uneven terrain.    Therapeutic Exercise - for improved soft tissue flexibility and extensibility as needed for ROM, improved strength as needed to improve performance of CKC  activities/functional movements   NuStep; Level 5, x 6 minutes - for improved soft tissue mobility and increased tissue temperature to improve muscle performance    -subjective gathered during this time    Seated heel raise; 2 x 12, 10-lb cuff weight resting on patient's knee   Total Gym; Level 20; double-limb heel raise; 3 x 10   Single limb stance; 2 x 30 sec   *not today*  Seated towel scrunch; 2 x 1 min, with added 1-lb Dbell on towel Reclined ankle Tband; PF x 20 ea dir with Black Tband  Reclined ankle Tband; inversion and eversion x 10 ea dir with Black Tband   -for HEP review  -PT holding band for INV   Ankle pump; PF/DF AROM as tolerated; x20 into PF and DF Seated ankle inversion/eversion with towel; x 20  Active straight leg raise; 2 x 10    Therapeutic Activity - to improve capacity for gait with less reliance on AD, pre-gait activities to improve gait stability and adequate weight shift   Minisquat; 2 x 10, with mirror feedback for technique Forward step-up; 6-inch step, staircase in center of gym; x 15  PATIENT EDUCATION: Discussed current progress, prognosis, POC.    *not today* Forward to retro step along blue agility ladder; 5x D/B Toe tapping on 6-inch step to simulate side-to-side weight shifting with ambulation; 2 x 10, alt Forward gait in hallway, focus on heel to toe progression and symmetrical weight shift; x3 D/B 70-ft hallway  PATIENT EDUCATION:  Education details: see above for patient education details Person educated: Patient Education method: Explanation, Demonstration, and Handouts Education comprehension: verbalized understanding and returned demonstration   HOME EXERCISE PROGRAM:  Access Code:  [email or web address] URL: [email or web address] Date: 02/07/2024 Prepared by: Venetia Endo  Exercises - Supine Ankle Inversion Eversion AROM  - 2 x daily - 7 x weekly - 2 sets - 10 reps - Towel Scrunches  - 2 x daily - 7 x weekly - 1 sets - 1-2 min hold - Long Sitting Ankle Plantar Flexion with Resistance  - 1 x daily - 7 x weekly - 2 sets - 10 reps - Long Sitting Ankle Eversion with Resistance  - 1 x daily - 7 x weekly - 2 sets - 10 reps - Seated Heel Raise  - 1-2 x daily - 7 x weekly - 2-3 sets - 10 reps - Ankle Inversion with Resistance  - 1 x daily - 7 x weekly - 2 sets - 10 reps - Active Straight Leg Raise with Quad Set  - 1 x daily - 7 x weekly - 2 sets - 10 reps   ASSESSMENT:  CLINICAL IMPRESSION: Patient is essentially fully weaned from his CAM boot at this time and had has been cleared from physician for continued gait without CAM boot unless he needs it for discomfort with gait. He demonstrates good ankle ROM and minimal gait deviations at this time. We are continuing to progress with plantarflexor and closed-chain strengthening per protocol. Pt exhibits good postural control relative to age and post-op timeframe. His LEFS has significantly improved, but he has not yet met 60/80 long-term goal. Pt is functional with stair negotiation and community-level gait, though gait deviations do emerge after prolonged ambulation. Patient has expected post-operative deficits in ankle/plantarflexor strength, post-op edema, gait stability/changes. Pt will continue to benefit from skilled PT services to address deficits and improve function.   OBJECTIVE IMPAIRMENTS: Abnormal gait, decreased activity tolerance, decreased balance, decreased mobility, difficulty walking, decreased ROM, decreased strength, hypomobility, increased edema, impaired flexibility, and pain.   ACTIVITY LIMITATIONS: lifting, standing, squatting, stairs, transfers, and locomotion level  PARTICIPATION LIMITATIONS:  driving, community activity, yard work, and Aeronautical engineer  PERSONAL FACTORS: 3 comorbidities (CVD/CAD, anxiety, dyslipidemia) are also affecting patient's functional outcome.   REHAB POTENTIAL: Good  CLINICAL DECISION MAKING: Evolving/moderate complexity  EVALUATION COMPLEXITY: Moderate   GOALS: Goals reviewed with patient? Yes  SHORT TERM GOALS: Target date: 02/01/2024  Pt will be independent with HEP to improve strength and decrease ankle pain to improve pain-free function at home and work. Baseline: 01/11/24: Baseline home exercises reviewed.      02/26/24: Pt recounts exercise well and is maintaining HEP.  Goal status: ACHIEVED  Pt will attain dorsiflexion to 10 degrees or greater by 8 weeks as needed for normalized gait pattern Baseline: 01/11/24: -15  deg DF at baseline/eval.     02/26/24: DF > 10 deg Goal status: ACHIEVED   LONG TERM GOALS: Target date: 05/13/2024  Pt will ambulate with normalized gait pattern for 660 feet or greater without reproduction of ankle pain and without AD or boot indicative of ability to perform normal community-level ambulation Baseline: 01/11/24: Ambulating home-level distance with bilateral crutches and CAM boot, partial weightbearing 25 lbs.     02/26/24: Pt is able to ambulate at community-level distance with moderate gait deviation/antalgic pattern during last 1-2 laps.  Goal status: IN PROGRESS/ON-GOING  2.  Pt will negotiate full flight of steps with reciprocal pattern and no major deviations or pain as needed for negotiating 2-story home with upstairs primary bedroom.  Baseline: 01/11/24: Pt has to scoot on stairs and use knee scooter at top/bottom of steps.     02/26/24: Pt able to complete reciprocal stair climb/descent safety.  Goal status: ACHIEVED   3.  Pt will increase LEFS score to 60/80 or greater in order demonstrate clinically significant improvement in ankle pain/level of function.       Baseline: 01/11/24: 21/80 = 26.3%    02/26/24: 43/80 =  53.8% Goal status: IN PROGRESS  4.  Pt will increase strength of tested ankle musculature to 4+/5 or greater MMT grade in order to demonstrate improvement in strength and function  Baseline: 01/11/24: 2 to 3+ ankle strength per MMT (see chart above).        02/26/24: Met for dorsiflexion and eversion, not met for plantarflexors and inversion.  Goal status: IN PROGRESS   PLAN: PT FREQUENCY: 1-2x/week  PT DURATION: 6-8 weeks  PLANNED INTERVENTIONS: Therapeutic exercises, Therapeutic activity, Neuromuscular re-education, Balance training, Gait training, Patient/Family education, Self Care, Joint mobilization, Joint manipulation, Vestibular training, Canalith repositioning, Orthotic/Fit training, DME instructions, Dry Needling, Electrical stimulation, Spinal manipulation, Spinal mobilization, Cryotherapy, Moist heat, Taping, Traction, Ultrasound, Ionotophoresis 4mg /ml Dexamethasone, Manual therapy, and Re-evaluation.  PLAN FOR NEXT SESSION: Progressive CKC loading, double-limb calf raise progression, balance and proprioceptive drills. Progress as tolerated per protocol.    Venetia Endo, PT, DPT #[id number]  Venetia ONEIDA Endo, PT 02/27/2024, 9:29 PM

## 2024-02-28 ENCOUNTER — Ambulatory Visit: Attending: Orthopedic Surgery | Admitting: Physical Therapy

## 2024-02-28 DIAGNOSIS — M25572 Pain in left ankle and joints of left foot: Secondary | ICD-10-CM | POA: Diagnosis present

## 2024-02-28 DIAGNOSIS — R262 Difficulty in walking, not elsewhere classified: Secondary | ICD-10-CM | POA: Diagnosis present

## 2024-02-28 DIAGNOSIS — M6281 Muscle weakness (generalized): Secondary | ICD-10-CM | POA: Insufficient documentation

## 2024-02-29 ENCOUNTER — Other Ambulatory Visit

## 2024-03-05 ENCOUNTER — Ambulatory Visit: Admitting: Physical Therapy

## 2024-03-06 NOTE — Therapy (Unsigned)
 OUTPATIENT PHYSICAL THERAPY ANKLE POST-OP TREATMENT  Patient Name: Jake Wagner MRN: [id number] DOB:06/28/1960, 63 y.o., male Today's Date: 03/07/2024  END OF SESSION:  PT End of Session - 03/07/24 1036     Visit Number 12    Number of Visits 21    Date for Recertification  03/21/24    PT Start Time 1035    PT Stop Time 1115    PT Time Calculation (min) 40 min    Equipment Utilized During Treatment --   bilateral axillary crutches, L CAM boot   Activity Tolerance Patient tolerated treatment well    Behavior During Therapy WFL for tasks assessed/performed           Past Medical History:  Diagnosis Date   Anginal pain    History of chicken pox    Hypercholesteremia    Past Surgical History:  Procedure Laterality Date   CARDIAC CATHETERIZATION Left 01/07/2016   Procedure: Left Heart Cath and Coronary Angiography;  Surgeon: Cara JONETTA Lovelace, MD;  Location: [facility];  Service: Cardiovascular;  Laterality: Left;   Patient Active Problem List   Diagnosis Date Noted   Achilles tendon tear 12/18/2023   Right knee pain 04/14/2023   History of pneumonia 02/01/2023   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 04/29/2022   History of COVID-19 08/01/2021   Left shoulder pain 07/27/2021   Respiratory illness 07/12/2021   Acute cough 05/20/2021   Erectile dysfunction 01/30/2021   Right ankle pain 03/12/2020   Stress 10/21/2018   Rash 10/21/2018   Blood in semen 10/21/2018   Memory change 01/14/2018   Hemorrhoid 01/14/2018   History of malignant melanoma of skin 06/15/2017   History of melanoma in situ 05/30/2017   Aortic aneurysm 10/22/2016   Chronic right shoulder pain 05/18/2016   Diastolic dysfunction 04/27/2016   Dilated aortic root 04/27/2016   Adhesive capsulitis of right shoulder 03/16/2016   Light headedness 02/21/2016   Right shoulder pain 02/18/2016   Fatigue 10/14/2015   CAD (coronary artery disease) 10/14/2015   History of colonic polyps  08/19/2015   Hypercholesterolemia 06/22/2015   History of knee surgery 06/22/2015   Health care maintenance 06/22/2015    PCP: Allena Hamilton, MD  REFERRING PROVIDER: Kaitlin M Picone, PA-C  REFERRING DIAG: [phone] (ICD-10-CM) - Strain of left Achilles tendon, initial encounter   Rationale for Evaluation and Treatment: Habilitation  THERAPY DIAG: Pain in left ankle and joints of left foot  Difficulty in walking, not elsewhere classified  Muscle weakness (generalized)  ONSET DATE: L Achilles tendon repair (DOS 12/13/23)   FOLLOW-UP APPT SCHEDULED WITH REFERRING PROVIDER: Yes ; in 3 weeks from initial eval, next f/u with surgeon's office  PERTINENT HISTORY: Pt is a 63 year old male s/p L Achilles tendon repair (DOS: 12/13/23). Patient reports no post-op complications. Patient reports some tenderness along Achilles tendon region. Pt has upstairs bedroom and scoots on bottom to negotiate steps.   PAIN:    Pain Intensity: Present: 0/10, Worst: 0/10 Pain location: Achilles tendon  Radiating: No  Swelling: Yes ; minor swelling on ankle Numbness/Tingling: Yes; seldom tingling in foot  History of prior back, hip, knee, or ankle injury, pain, surgery, or therapy: Yes; L ankle dislocation in 8th grade, no other significant Hx  Imaging: No  Typical footwear:  Red flags: Negative for personal history of cancer, chills/fever, night sweats, nausea, vomiting, unrelenting pain): Negative  PRECAUTIONS: Other: Weightbearing restriction and no DF beyond neutral   WEIGHT BEARING RESTRICTIONS: Yes 50  lbs PWB, increase 25 lbs each successive week starting 01/11/24  FALLS: Has patient fallen in last 6 months? No  Living Environment Lives with: lives with their spouse and brother-in-law Lives in: House/apartment Stairs: Yes: Internal: 16 steps; on left going up and External: 3 steps; on right going up (going into garage) Has following equipment at home: Crutches, knee scooter   Prior level of  function: Independent  Occupational demands: Photography business  Hobbies: Working on yard, Psychologist, clinical  Patient Goals: Return to baseline    OBJECTIVE (data from initial evaluation unless otherwise dated):   Patient Surveys  LEFS: 21/80 = 26.3%   Gross Musculoskeletal Assessment Bulk: Normal Tone: Normal Moderate ankle edema, mild erythema along incision and ankle. No sign of acute infection or delayed healing.   GAIT: Distance walked: 30 ft Assistive device utilized: Bilateral axillary crutches Level of assistance: SBA Comments: PWB LLE in bilateral axillary crutches in CAM boot only, proper use of upper limb support to offload surgical LE   AROM AROM (Normal range in degrees) AROM  01/11/24 AROM 02/26/24   Right Left Left  Knee     Flexion (135) WNL WNL   Extension (0) WNL WNL        Ankle     Dorsiflexion (20)  -15 12 (18 passively)  Plantarflexion (50)  40 WNL  Inversion (35)  20 35  Eversion (15)  WNL WNL  (* = pain; Blank rows = not tested)   LE MMT: MMT (out of 5) Right 01/11/24 Left 01/11/24 Right 02/26/24 Left 02/26/24  Hip flexion 4- 4    Hip extension      Hip abduction 4+ 4+    Hip adduction      Hip internal rotation      Hip external rotation      Knee flexion 5 5    Knee extension 5 5    Ankle dorsiflexion 5 3+  5  Ankle plantarflexion 5 2  3+ (no single- limb heel raise yet)  Ankle inversion 5 3+  4  Ankle eversion 5 3+  4+  (* = pain; Blank rows = not tested)  Sensation Grossly intact to light touch throughout bilateral LEs as determined by testing dermatomes L2-S2. Proprioception, stereognosis, and hot/cold testing deferred on this date.  Reflexes Deferred  Palpation Mid-substance Achilles 1, gastroc-Achilles musculotendinous junction 1, insertional Achilles 0, calcaneal tuberosity 0 Graded on 0-4 scale (0 = no pain, 1 = pain, 2 = pain with wincing/grimacing/flinching, 3 = pain with withdrawal, 4 = unwilling to allow palpation),  (Blank rows = not tested)  Passive Accessory Motion Deferred  VASCULAR Dorsalis pedis and posterior tibial pulses are palpable Capillary refill WNL      TODAY'S TREATMENT: DATE: 03/07/2024  SUBJECTIVE STATEMENT:   Pt reports no notable pain at arrival. He reports some soreness with prolonged standing. He reports intermittent swelling - he is working on ice/elevation/compressive sock prn. He reports being able to walk up to 1 mi with mild heel soreness.    Manual Therapy - joint mobility, ROM; prevention of motion loss during period of immobilization/CAM boot, edema control   Pt reclined: L ankle PROM check within pt tolerance; x 1 min  -WNL for all directions, no dorsiflexion motion loss    Therapeutic Exercise - for improved soft tissue flexibility and extensibility as needed for ROM, improved strength as needed to improve performance of CKC  activities/functional movements    NuStep; Level 5, x 6 minutes - for improved  soft tissue mobility and increased tissue temperature to improve muscle performance    Standing heel raise; 2 x 12   Seated heel raise; 2 x 12, 15-lb cuff weight resting on patient's knee   Single limb stance on Airex; 2 x 30 sec     *not today* Tandem walk on Airex; 4x D/B, forward stepping only  Total Gym; Level 22; double-limb heel raise; 3 x 10  Seated towel scrunch; 2 x 1 min, with added 1-lb Dbell on towel Reclined ankle Tband; PF x 20 ea dir with Black Tband  Reclined ankle Tband; inversion and eversion x 10 ea dir with Black Tband   -for HEP review  -PT holding band for INV   Ankle pump; PF/DF AROM as tolerated; x20 into PF and DF Seated ankle inversion/eversion with towel; x 20 Active straight leg raise; 2 x 10    Therapeutic Activity - to improve capacity for gait with less reliance on AD, pre-gait activities to improve gait stability and adequate weight shift   Minisquat, with 8-lb Goblet hold; 2 x 10, with mirror feedback for  technique Forward step-up to hurdle step; 6-inch step, staircase in center of gym; x 20 Forward mini-lunge; 2 x 10  PATIENT EDUCATION: HEP update and review. Discussed safety with gradually progressing walking; precaution for uneven ground  and contraindication for plyometric loading.    *not today* Forward to retro step along blue agility ladder; 5x D/B Toe tapping on 6-inch step to simulate side-to-side weight shifting with ambulation; 2 x 10, alt Forward gait in hallway, focus on heel to toe progression and symmetrical weight shift; x3 D/B 70-ft hallway   PATIENT EDUCATION:  Education details: see above for patient education details Person educated: Patient Education method: Explanation, Demonstration, and Handouts Education comprehension: verbalized understanding and returned demonstration   HOME EXERCISE PROGRAM:  Access Code: [email or web address] URL: [email or web address] Date: 03/07/2024 Prepared by: Venetia Endo  Exercises - Long Sitting Ankle Plantar Flexion with Resistance  - 1 x daily - 7 x weekly - 2 sets - 10 reps - Ankle Inversion with Resistance  - 1 x daily - 7 x weekly - 2 sets - 10 reps - Seated Heel Raise  - 1-2 x daily - 7 x weekly - 2-3 sets - 10 reps - Standing Heel Raise  - 1-2 x daily - 7 x weekly - 2-3 sets - 10-15 reps - Single Leg Stance on Foam Pad  - 4-5 x weekly - 3 sets - 30sec hold - Goblet Squat with Kettlebell  - 1 x daily - 4-5 x weekly - 2 sets - 10 reps - Lunge with Counter Support  - 1 x daily - 4-5 x weekly - 2 sets - 10 reps   ASSESSMENT:  CLINICAL IMPRESSION: Patient is making excellent progress and is able to complete double-limb heel raises and increased load with seated heel raise. He exhibits good single-limb stance on compliant surface and is able to continue with CKC loading without notable Achilles discomfort. He does have intermittent swelling with prolonged weightbearing, though quantity of edema has improved per pt.  Patient has expected post-operative deficits in ankle/plantarflexor strength, post-op edema, gait stability/changes. Pt will continue to benefit from skilled PT services to address deficits and improve function.   OBJECTIVE IMPAIRMENTS: Abnormal gait, decreased activity tolerance, decreased balance, decreased mobility, difficulty walking, decreased ROM, decreased strength, hypomobility, increased edema, impaired flexibility, and pain.   ACTIVITY LIMITATIONS: lifting, standing, squatting, stairs, transfers, and locomotion level  PARTICIPATION LIMITATIONS:  driving, community activity, yard work, and Aeronautical engineer  PERSONAL FACTORS: 3 comorbidities (CVD/CAD, anxiety, dyslipidemia) are also affecting patient's functional outcome.   REHAB POTENTIAL: Good  CLINICAL DECISION MAKING: Evolving/moderate complexity  EVALUATION COMPLEXITY: Moderate   GOALS: Goals reviewed with patient? Yes  SHORT TERM GOALS: Target date: 02/01/2024  Pt will be independent with HEP to improve strength and decrease ankle pain to improve pain-free function at home and work. Baseline: 01/11/24: Baseline home exercises reviewed.      02/26/24: Pt recounts exercise well and is maintaining HEP.  Goal status: ACHIEVED  Pt will attain dorsiflexion to 10 degrees or greater by 8 weeks as needed for normalized gait pattern Baseline: 01/11/24: -15 deg DF at baseline/eval.     02/26/24: DF > 10 deg Goal status: ACHIEVED   LONG TERM GOALS: Target date: 05/13/2024  Pt will ambulate with normalized gait pattern for 660 feet or greater without reproduction of ankle pain and without AD or boot indicative of ability to perform normal community-level ambulation Baseline: 01/11/24: Ambulating home-level distance with bilateral crutches and CAM boot, partial weightbearing 25 lbs.     02/26/24: Pt is able to ambulate at community-level distance with moderate gait deviation/antalgic pattern during last 1-2 laps.  Goal status: IN  PROGRESS/ON-GOING  2.  Pt will negotiate full flight of steps with reciprocal pattern and no major deviations or pain as needed for negotiating 2-story home with upstairs primary bedroom.  Baseline: 01/11/24: Pt has to scoot on stairs and use knee scooter at top/bottom of steps.     02/26/24: Pt able to complete reciprocal stair climb/descent safety.  Goal status: ACHIEVED   3.  Pt will increase LEFS score to 60/80 or greater in order demonstrate clinically significant improvement in ankle pain/level of function.       Baseline: 01/11/24: 21/80 = 26.3%    02/26/24: 43/80 = 53.8% Goal status: IN PROGRESS  4.  Pt will increase strength of tested ankle musculature to 4+/5 or greater MMT grade in order to demonstrate improvement in strength and function  Baseline: 01/11/24: 2 to 3+ ankle strength per MMT (see chart above).        02/26/24: Met for dorsiflexion and eversion, not met for plantarflexors and inversion.  Goal status: IN PROGRESS   PLAN: PT FREQUENCY: 1-2x/week  PT DURATION: 6-8 weeks  PLANNED INTERVENTIONS: Therapeutic exercises, Therapeutic activity, Neuromuscular re-education, Balance training, Gait training, Patient/Family education, Self Care, Joint mobilization, Joint manipulation, Vestibular training, Canalith repositioning, Orthotic/Fit training, DME instructions, Dry Needling, Electrical stimulation, Spinal manipulation, Spinal mobilization, Cryotherapy, Moist heat, Taping, Traction, Ultrasound, Ionotophoresis 4mg /ml Dexamethasone, Manual therapy, and Re-evaluation.  PLAN FOR NEXT SESSION: Progressive CKC loading, double-limb calf raise progression, balance and proprioceptive drills. Progress as tolerated per protocol.    Venetia Endo, PT, DPT #[id number]  Venetia ONEIDA Endo, PT 03/07/2024, 11:16 AM

## 2024-03-07 ENCOUNTER — Ambulatory Visit: Admitting: Physical Therapy

## 2024-03-07 DIAGNOSIS — M6281 Muscle weakness (generalized): Secondary | ICD-10-CM

## 2024-03-07 DIAGNOSIS — M25572 Pain in left ankle and joints of left foot: Secondary | ICD-10-CM

## 2024-03-07 DIAGNOSIS — R262 Difficulty in walking, not elsewhere classified: Secondary | ICD-10-CM

## 2024-03-11 ENCOUNTER — Encounter: Payer: Self-pay | Admitting: Physical Therapy

## 2024-03-11 ENCOUNTER — Ambulatory Visit: Admitting: Physical Therapy

## 2024-03-11 DIAGNOSIS — M25572 Pain in left ankle and joints of left foot: Secondary | ICD-10-CM | POA: Diagnosis not present

## 2024-03-11 DIAGNOSIS — R262 Difficulty in walking, not elsewhere classified: Secondary | ICD-10-CM

## 2024-03-11 DIAGNOSIS — M6281 Muscle weakness (generalized): Secondary | ICD-10-CM

## 2024-03-11 NOTE — Therapy (Signed)
 OUTPATIENT PHYSICAL THERAPY ANKLE POST-OP TREATMENT  Patient Name: Jake Wagner MRN: [id number] DOB:[birth year], 63 y.o., male Today's Date: 03/11/2024  END OF SESSION:  PT End of Session - 03/11/24 0904     Visit Number 13    Number of Visits 21    Date for Recertification  03/21/24    PT Start Time 0904    PT Stop Time 0943    PT Time Calculation (min) 39 min    Equipment Utilized During Treatment --   bilateral axillary crutches, L CAM boot   Activity Tolerance Patient tolerated treatment well    Behavior During Therapy WFL for tasks assessed/performed            Past Medical History:  Diagnosis Date   Anginal pain    History of chicken pox    Hypercholesteremia    Past Surgical History:  Procedure Laterality Date   CARDIAC CATHETERIZATION Left 01/07/2016   Procedure: Left Heart Cath and Coronary Angiography;  Surgeon: Cara JONETTA Lovelace, MD;  Location: [facility];  Service: Cardiovascular;  Laterality: Left;   Patient Active Problem List   Diagnosis Date Noted   Achilles tendon tear 12/18/2023   Right knee pain 04/14/2023   History of pneumonia 02/01/2023   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 04/29/2022   History of COVID-19 08/01/2021   Left shoulder pain 07/27/2021   Respiratory illness 07/12/2021   Acute cough 05/20/2021   Erectile dysfunction 01/30/2021   Right ankle pain 03/12/2020   Stress 10/21/2018   Rash 10/21/2018   Blood in semen 10/21/2018   Memory change 01/14/2018   Hemorrhoid 01/14/2018   History of malignant melanoma of skin 06/15/2017   History of melanoma in situ 05/30/2017   Aortic aneurysm 10/22/2016   Chronic right shoulder pain 05/18/2016   Diastolic dysfunction 04/27/2016   Dilated aortic root 04/27/2016   Adhesive capsulitis of right shoulder 03/16/2016   Light headedness 02/21/2016   Right shoulder pain 02/18/2016   Fatigue 10/14/2015   CAD (coronary artery disease) 10/14/2015   History of colonic polyps  08/19/2015   Hypercholesterolemia 06/22/2015   History of knee surgery 06/22/2015   Health care maintenance 06/22/2015    PCP: Allena Hamilton, MD  REFERRING PROVIDER: Kaitlin M Picone, PA-C  REFERRING DIAG: [phone] (ICD-10-CM) - Strain of left Achilles tendon, initial encounter   Rationale for Evaluation and Treatment: Habilitation  THERAPY DIAG: Pain in left ankle and joints of left foot  Difficulty in walking, not elsewhere classified  Muscle weakness (generalized)  ONSET DATE: L Achilles tendon repair (DOS 12/13/23)   FOLLOW-UP APPT SCHEDULED WITH REFERRING PROVIDER: Yes ; in 3 weeks from initial eval, next f/u with surgeon's office  PERTINENT HISTORY: Pt is a 63 year old male s/p L Achilles tendon repair (DOS: 12/13/23). Patient reports no post-op complications. Patient reports some tenderness along Achilles tendon region. Pt has upstairs bedroom and scoots on bottom to negotiate steps.   PAIN:    Pain Intensity: Present: 0/10, Worst: 0/10 Pain location: Achilles tendon  Radiating: No  Swelling: Yes ; minor swelling on ankle Numbness/Tingling: Yes; seldom tingling in foot  History of prior back, hip, knee, or ankle injury, pain, surgery, or therapy: Yes; L ankle dislocation in 8th grade, no other significant Hx  Imaging: No  Typical footwear:  Red flags: Negative for personal history of cancer, chills/fever, night sweats, nausea, vomiting, unrelenting pain): Negative  PRECAUTIONS: Other: Weightbearing restriction and no DF beyond neutral   WEIGHT BEARING RESTRICTIONS: Yes  50 lbs PWB, increase 25 lbs each successive week starting 01/11/24  FALLS: Has patient fallen in last 6 months? No  Living Environment Lives with: lives with their spouse and brother-in-law Lives in: House/apartment Stairs: Yes: Internal: 16 steps; on left going up and External: 3 steps; on right going up (going into garage) Has following equipment at home: Crutches, knee scooter   Prior level of  function: Independent  Occupational demands: Photography business  Hobbies: Working on yard, Psychologist, clinical  Patient Goals: Return to baseline    OBJECTIVE (data from initial evaluation unless otherwise dated):   Patient Surveys  LEFS: 21/80 = 26.3%   Gross Musculoskeletal Assessment Bulk: Normal Tone: Normal Moderate ankle edema, mild erythema along incision and ankle. No sign of acute infection or delayed healing.   GAIT: Distance walked: 30 ft Assistive device utilized: Bilateral axillary crutches Level of assistance: SBA Comments: PWB LLE in bilateral axillary crutches in CAM boot only, proper use of upper limb support to offload surgical LE   AROM AROM (Normal range in degrees) AROM  01/11/24 AROM 02/26/24   Right Left Left  Knee     Flexion (135) WNL WNL   Extension (0) WNL WNL        Ankle     Dorsiflexion (20)  -15 12 (18 passively)  Plantarflexion (50)  40 WNL  Inversion (35)  20 35  Eversion (15)  WNL WNL  (* = pain; Blank rows = not tested)   LE MMT: MMT (out of 5) Right 01/11/24 Left 01/11/24 Right 02/26/24 Left 02/26/24  Hip flexion 4- 4    Hip extension      Hip abduction 4+ 4+    Hip adduction      Hip internal rotation      Hip external rotation      Knee flexion 5 5    Knee extension 5 5    Ankle dorsiflexion 5 3+  5  Ankle plantarflexion 5 2  3+ (no single- limb heel raise yet)  Ankle inversion 5 3+  4  Ankle eversion 5 3+  4+  (* = pain; Blank rows = not tested)  Sensation Grossly intact to light touch throughout bilateral LEs as determined by testing dermatomes L2-S2. Proprioception, stereognosis, and hot/cold testing deferred on this date.  Reflexes Deferred  Palpation Mid-substance Achilles 1, gastroc-Achilles musculotendinous junction 1, insertional Achilles 0, calcaneal tuberosity 0 Graded on 0-4 scale (0 = no pain, 1 = pain, 2 = pain with wincing/grimacing/flinching, 3 = pain with withdrawal, 4 = unwilling to allow palpation),  (Blank rows = not tested)  Passive Accessory Motion Deferred  VASCULAR Dorsalis pedis and posterior tibial pulses are palpable Capillary refill WNL      TODAY'S TREATMENT: DATE: 03/11/2024  SUBJECTIVE STATEMENT:   Pt reports no notable issues or major update since last visit. Patient reports compliance with HEP - he has not gotten out weights yet for progression of seated heel raise.    Manual Therapy - joint mobility, ROM; prevention of motion loss during period of immobilization/CAM boot, edema control   Pt reclined: L ankle PROM check within pt tolerance; x 1 min  -WNL for all directions, no dorsiflexion motion loss    Therapeutic Exercise - for improved soft tissue flexibility and extensibility as needed for ROM, improved strength as needed to improve performance of CKC  activities/functional movements    NuStep; Level 6, x 6 minutes - for improved soft tissue mobility and increased tissue temperature to improve  muscle performance    Standing heel raise; 2 x 15   Seated heel raise; 2 x 12, 15-lb cuff weight resting on patient's knee   Tandem stance on Airex; x 30 sec each position   Single limb stance on Airex; 2 x 30 sec    *not today* Tandem walk on Airex; 4x D/B, forward stepping only  Total Gym; Level 22; double-limb heel raise; 3 x 10  Seated towel scrunch; 2 x 1 min, with added 1-lb Dbell on towel Reclined ankle Tband; PF x 20 ea dir with Black Tband  Reclined ankle Tband; inversion and eversion x 10 ea dir with Black Tband   -for HEP review  -PT holding band for INV   Ankle pump; PF/DF AROM as tolerated; x20 into PF and DF Seated ankle inversion/eversion with towel; x 20 Active straight leg raise; 2 x 10    Therapeutic Activity - to improve capacity for gait with less reliance on AD, pre-gait activities to improve gait stability and adequate weight shift   Minisquat, with 8-lb Goblet hold; 2 x 12, with mirror feedback for technique Forward step-up  to hurdle step; 6-inch step, staircase in center of gym; x 20 Forward mini-lunge, to Airex; 2 x 10 Total Gym single-limb half squat; 2 x 10, Level 20  PATIENT EDUCATION: HEP update and review. Discussed safety with gradually progressing walking; precaution for uneven ground  and contraindication for plyometric loading.    *not today* Forward to retro step along blue agility ladder; 5x D/B Toe tapping on 6-inch step to simulate side-to-side weight shifting with ambulation; 2 x 10, alt Forward gait in hallway, focus on heel to toe progression and symmetrical weight shift; x3 D/B 70-ft hallway   PATIENT EDUCATION:  Education details: see above for patient education details Person educated: Patient Education method: Explanation, Demonstration, and Handouts Education comprehension: verbalized understanding and returned demonstration   HOME EXERCISE PROGRAM:  Access Code: [email or web address] URL: [email or web address] Date: 03/07/2024 Prepared by: Venetia Endo  Exercises - Long Sitting Ankle Plantar Flexion with Resistance  - 1 x daily - 7 x weekly - 2 sets - 10 reps - Ankle Inversion with Resistance  - 1 x daily - 7 x weekly - 2 sets - 10 reps - Seated Heel Raise  - 1-2 x daily - 7 x weekly - 2-3 sets - 10 reps - Standing Heel Raise  - 1-2 x daily - 7 x weekly - 2-3 sets - 10-15 reps - Single Leg Stance on Foam Pad  - 4-5 x weekly - 3 sets - 30sec hold - Goblet Squat with Kettlebell  - 1 x daily - 4-5 x weekly - 2 sets - 10 reps - Lunge with Counter Support  - 1 x daily - 4-5 x weekly - 2 sets - 10 reps   ASSESSMENT:  CLINICAL IMPRESSION: Patient has excellent ROM and is progressing well with CKC isotonics in clinic. He has updated HEP from Thursday and is continuing with standing heel raises and LE stability drills at home without any unipedal heel raise yet. We have worked into more unipedal exercises without notable discomfort per pt. Patient has minimal pain and is  appropriate for progression of HEP with less-frequent follow-ups through this month. Patient has expected post-operative deficits in ankle/plantarflexor strength, post-op edema, gait stability/changes. Pt will continue to benefit from skilled PT services to address deficits and improve function.   OBJECTIVE IMPAIRMENTS: Abnormal gait, decreased activity tolerance, decreased balance, decreased mobility, difficulty walking, decreased ROM,  decreased strength, hypomobility, increased edema, impaired flexibility, and pain.   ACTIVITY LIMITATIONS: lifting, standing, squatting, stairs, transfers, and locomotion level  PARTICIPATION LIMITATIONS: driving, community activity, yard work, and Aeronautical engineer  PERSONAL FACTORS: 3 comorbidities (CVD/CAD, anxiety, dyslipidemia) are also affecting patient's functional outcome.   REHAB POTENTIAL: Good  CLINICAL DECISION MAKING: Evolving/moderate complexity  EVALUATION COMPLEXITY: Moderate   GOALS: Goals reviewed with patient? Yes  SHORT TERM GOALS: Target date: 02/01/2024  Pt will be independent with HEP to improve strength and decrease ankle pain to improve pain-free function at home and work. Baseline: 01/11/24: Baseline home exercises reviewed.      02/26/24: Pt recounts exercise well and is maintaining HEP.  Goal status: ACHIEVED  Pt will attain dorsiflexion to 10 degrees or greater by 8 weeks as needed for normalized gait pattern Baseline: 01/11/24: -15 deg DF at baseline/eval.     02/26/24: DF > 10 deg Goal status: ACHIEVED   LONG TERM GOALS: Target date: 05/13/2024  Pt will ambulate with normalized gait pattern for 660 feet or greater without reproduction of ankle pain and without AD or boot indicative of ability to perform normal community-level ambulation Baseline: 01/11/24: Ambulating home-level distance with bilateral crutches and CAM boot, partial weightbearing 25 lbs.     02/26/24: Pt is able to ambulate at community-level distance with moderate  gait deviation/antalgic pattern during last 1-2 laps.  Goal status: IN PROGRESS/ON-GOING  2.  Pt will negotiate full flight of steps with reciprocal pattern and no major deviations or pain as needed for negotiating 2-story home with upstairs primary bedroom.  Baseline: 01/11/24: Pt has to scoot on stairs and use knee scooter at top/bottom of steps.     02/26/24: Pt able to complete reciprocal stair climb/descent safety.  Goal status: ACHIEVED   3.  Pt will increase LEFS score to 60/80 or greater in order demonstrate clinically significant improvement in ankle pain/level of function.       Baseline: 01/11/24: 21/80 = 26.3%    02/26/24: 43/80 = 53.8% Goal status: IN PROGRESS  4.  Pt will increase strength of tested ankle musculature to 4+/5 or greater MMT grade in order to demonstrate improvement in strength and function  Baseline: 01/11/24: 2 to 3+ ankle strength per MMT (see chart above).        02/26/24: Met for dorsiflexion and eversion, not met for plantarflexors and inversion.  Goal status: IN PROGRESS   PLAN: PT FREQUENCY: 1-2x/week  PT DURATION: 6-8 weeks  PLANNED INTERVENTIONS: Therapeutic exercises, Therapeutic activity, Neuromuscular re-education, Balance training, Gait training, Patient/Family education, Self Care, Joint mobilization, Joint manipulation, Vestibular training, Canalith repositioning, Orthotic/Fit training, DME instructions, Dry Needling, Electrical stimulation, Spinal manipulation, Spinal mobilization, Cryotherapy, Moist heat, Taping, Traction, Ultrasound, Ionotophoresis 4mg /ml Dexamethasone, Manual therapy, and Re-evaluation.  PLAN FOR NEXT SESSION: Progressive CKC loading, double-limb calf raise progression, balance and proprioceptive drills. Progress as tolerated per protocol.    Venetia Endo, PT, DPT #[id number]  Venetia ONEIDA Endo, PT 03/11/2024, 9:04 AM

## 2024-03-18 ENCOUNTER — Ambulatory Visit: Admitting: Physical Therapy

## 2024-03-18 ENCOUNTER — Encounter: Payer: Self-pay | Admitting: Physical Therapy

## 2024-03-18 DIAGNOSIS — R262 Difficulty in walking, not elsewhere classified: Secondary | ICD-10-CM

## 2024-03-18 DIAGNOSIS — M25572 Pain in left ankle and joints of left foot: Secondary | ICD-10-CM | POA: Diagnosis not present

## 2024-03-18 DIAGNOSIS — M6281 Muscle weakness (generalized): Secondary | ICD-10-CM

## 2024-03-18 NOTE — Therapy (Signed)
 OUTPATIENT PHYSICAL THERAPY ANKLE POST-OP TREATMENT  Patient Name: Jake Wagner MRN: [id number] DOB:11-21-1960, 63 y.o., male Today's Date: 03/18/2024  END OF SESSION:  PT End of Session - 03/18/24 0912     Visit Number 14    Number of Visits 21    Date for Recertification  03/21/24    PT Start Time 0905    PT Stop Time 0946    PT Time Calculation (min) 41 min    Equipment Utilized During Treatment --   bilateral axillary crutches, L CAM boot   Activity Tolerance Patient tolerated treatment well    Behavior During Therapy WFL for tasks assessed/performed           Past Medical History:  Diagnosis Date   Anginal pain    History of chicken pox    Hypercholesteremia    Past Surgical History:  Procedure Laterality Date   CARDIAC CATHETERIZATION Left 01/07/2016   Procedure: Left Heart Cath and Coronary Angiography;  Surgeon: Cara JONETTA Lovelace, MD;  Location: [facility];  Service: Cardiovascular;  Laterality: Left;   Patient Active Problem List   Diagnosis Date Noted   Achilles tendon tear 12/18/2023   Right knee pain 04/14/2023   History of pneumonia 02/01/2023   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 04/29/2022   History of COVID-19 08/01/2021   Left shoulder pain 07/27/2021   Respiratory illness 07/12/2021   Acute cough 05/20/2021   Erectile dysfunction 01/30/2021   Right ankle pain 03/12/2020   Stress 10/21/2018   Rash 10/21/2018   Blood in semen 10/21/2018   Memory change 01/14/2018   Hemorrhoid 01/14/2018   History of malignant melanoma of skin 06/15/2017   History of melanoma in situ 05/30/2017   Aortic aneurysm 10/22/2016   Chronic right shoulder pain 05/18/2016   Diastolic dysfunction 04/27/2016   Dilated aortic root 04/27/2016   Adhesive capsulitis of right shoulder 03/16/2016   Light headedness 02/21/2016   Right shoulder pain 02/18/2016   Fatigue 10/14/2015   CAD (coronary artery disease) 10/14/2015   History of colonic polyps  08/19/2015   Hypercholesterolemia 06/22/2015   History of knee surgery 06/22/2015   Health care maintenance 06/22/2015    PCP: Allena Hamilton, MD  REFERRING PROVIDER: Kaitlin M Picone, PA-C  REFERRING DIAG: [id number] (ICD-10-CM) - Strain of left Achilles tendon, initial encounter   Rationale for Evaluation and Treatment: Habilitation  THERAPY DIAG: Pain in left ankle and joints of left foot  Difficulty in walking, not elsewhere classified  Muscle weakness (generalized)  ONSET DATE: L Achilles tendon repair (DOS 12/13/23)   FOLLOW-UP APPT SCHEDULED WITH REFERRING PROVIDER: Yes ; in 3 weeks from initial eval, next f/u with surgeon's office  PERTINENT HISTORY: Pt is a 63 year old male s/p L Achilles tendon repair (DOS: 12/13/23). Patient reports no post-op complications. Patient reports some tenderness along Achilles tendon region. Pt has upstairs bedroom and scoots on bottom to negotiate steps.   PAIN:    Pain Intensity: Present: 0/10, Worst: 0/10 Pain location: Achilles tendon  Radiating: No  Swelling: Yes ; minor swelling on ankle Numbness/Tingling: Yes; seldom tingling in foot  History of prior back, hip, knee, or ankle injury, pain, surgery, or therapy: Yes; L ankle dislocation in 8th grade, no other significant Hx  Imaging: No  Typical footwear:  Red flags: Negative for personal history of cancer, chills/fever, night sweats, nausea, vomiting, unrelenting pain): Negative  PRECAUTIONS: Other: Weightbearing restriction and no DF beyond neutral   WEIGHT BEARING RESTRICTIONS: Yes 50  lbs PWB, increase 25 lbs each successive week starting 01/11/24  FALLS: Has patient fallen in last 6 months? No  Living Environment Lives with: lives with their spouse and brother-in-law Lives in: House/apartment Stairs: Yes: Internal: 16 steps; on left going up and External: 3 steps; on right going up (going into garage) Has following equipment at home: Crutches, knee scooter   Prior level of  function: Independent  Occupational demands: Photography business  Hobbies: Working on yard, Psychologist, clinical  Patient Goals: Return to baseline    OBJECTIVE (data from initial evaluation unless otherwise dated):   Patient Surveys  LEFS: 21/80 = 26.3%   Gross Musculoskeletal Assessment Bulk: Normal Tone: Normal Moderate ankle edema, mild erythema along incision and ankle. No sign of acute infection or delayed healing.   GAIT: Distance walked: 30 ft Assistive device utilized: Bilateral axillary crutches Level of assistance: SBA Comments: PWB LLE in bilateral axillary crutches in CAM boot only, proper use of upper limb support to offload surgical LE   AROM AROM (Normal range in degrees) AROM  01/11/24 AROM 02/26/24   Right Left Left  Knee     Flexion (135) WNL WNL   Extension (0) WNL WNL        Ankle     Dorsiflexion (20)  -15 12 (18 passively)  Plantarflexion (50)  40 WNL  Inversion (35)  20 35  Eversion (15)  WNL WNL  (* = pain; Blank rows = not tested)   LE MMT: MMT (out of 5) Right 01/11/24 Left 01/11/24 Right 02/26/24 Left 02/26/24  Hip flexion 4- 4    Hip extension      Hip abduction 4+ 4+    Hip adduction      Hip internal rotation      Hip external rotation      Knee flexion 5 5    Knee extension 5 5    Ankle dorsiflexion 5 3+  5  Ankle plantarflexion 5 2  3+ (no single- limb heel raise yet)  Ankle inversion 5 3+  4  Ankle eversion 5 3+  4+  (* = pain; Blank rows = not tested)  Sensation Grossly intact to light touch throughout bilateral LEs as determined by testing dermatomes L2-S2. Proprioception, stereognosis, and hot/cold testing deferred on this date.  Reflexes Deferred  Palpation Mid-substance Achilles 1, gastroc-Achilles musculotendinous junction 1, insertional Achilles 0, calcaneal tuberosity 0 Graded on 0-4 scale (0 = no pain, 1 = pain, 2 = pain with wincing/grimacing/flinching, 3 = pain with withdrawal, 4 = unwilling to allow palpation),  (Blank rows = not tested)  Passive Accessory Motion Deferred  VASCULAR Dorsalis pedis and posterior tibial pulses are palpable Capillary refill WNL      TODAY'S TREATMENT: DATE: 03/18/2024  SUBJECTIVE STATEMENT:   Pt reports no new concerns at arrival. No major updates. Pt reports no pain at arrival. Patient reports doing okay with his HEP.    Manual Therapy - joint mobility, ROM; prevention of motion loss during period of immobilization/CAM boot, edema control   Pt reclined: L ankle PROM check within pt tolerance; x 1 min  -WNL for all directions, no dorsiflexion motion loss STM L Achilles and gentle medial/lateral mobilization of tendon; x 2 minutes     Therapeutic Exercise - for improved soft tissue flexibility and extensibility as needed for ROM, improved strength as needed to improve performance of CKC  activities/functional movements    NuStep; Level 5 , x 6 minutes - for improved soft tissue mobility  and increased tissue temperature to improve muscle performance    Standing heel raise; 3 x 15   Single limb stance on Airex, with Med ball toss to rebounder; 3 x 15 with 6.6-lb ball    Seated heel raise; 2 x 12, 20-lb cuff weight (10-lb + 10-lb) resting on patient's knee  *not today* Tandem stance on Airex; x 30 sec each position Tandem walk on Airex; 4x D/B, forward stepping only  Total Gym; Level 22; double-limb heel raise; 3 x 10  Seated towel scrunch; 2 x 1 min, with added 1-lb Dbell on towel Reclined ankle Tband; PF x 20 ea dir with Black Tband  Reclined ankle Tband; inversion and eversion x 10 ea dir with Black Tband   -for HEP review  -PT holding band for INV   Ankle pump; PF/DF AROM as tolerated; x20 into PF and DF Seated ankle inversion/eversion with towel; x 20 Active straight leg raise; 2 x 10    Therapeutic Activity - to improve capacity for gait with less reliance on AD, pre-gait activities to improve gait stability and adequate weight  shift   Minisquat, with 10-lb Goblet hold; 2 x 12, with mirror feedback for technique Forward step-up to hurdle step; 12-inch step, staircase in center of gym; 2 x 12 Forward mini-lunge, to Airex; 2 x 12 Total Gym single-limb half squat; 2 x 15, Level 22  PATIENT EDUCATION: HEP update and review. Discussed ongoing activity modification and expected progression per protocol.    *not today* Forward to retro step along blue agility ladder; 5x D/B Toe tapping on 6-inch step to simulate side-to-side weight shifting with ambulation; 2 x 10, alt Forward gait in hallway, focus on heel to toe progression and symmetrical weight shift; x3 D/B 70-ft hallway   PATIENT EDUCATION:  Education details: see above for patient education details Person educated: Patient Education method: Explanation, Demonstration, and Handouts Education comprehension: verbalized understanding and returned demonstration   HOME EXERCISE PROGRAM:  Access Code: [email or web address] URL: [email or web address] Date: 03/18/2024 Prepared by: Venetia Endo  Exercises - Long Sitting Ankle Plantar Flexion with Resistance  - 1 x daily - 7 x weekly - 2 sets - 10 reps - Ankle Inversion with Resistance  - 1 x daily - 7 x weekly - 2 sets - 10 reps - Seated Heel Raise  - 1-2 x daily - 7 x weekly - 2-3 sets - 10 reps - Standing Heel Raise  - 1-2 x daily - 7 x weekly - 2-3 sets - 15 reps - Single Leg Stance on Foam Pad  - 4-5 x weekly - 3 sets - 30sec hold - Goblet Squat with Kettlebell  - 1 x daily - 4-5 x weekly - 2 sets - 12-15 reps - Lunge with Counter Support  - 1 x daily - 4-5 x weekly - 2 sets - 12-15 reps - Runner's Step Up/Down  - 1 x daily - 4-5 x weekly - 2 sets - 12-15 reps   ASSESSMENT:  CLINICAL IMPRESSION: Patient is progressing very well s/p L Achilles tendon repair and has no notable issues with progression in volume of CKC strengthening drills. We increased intensity of balance/proprioceptive training  today with pt having no notable pain. Pt has minimal L ankle swelling at this time and normal ROM. Patient has expected post-operative deficits in ankle/plantarflexor strength, post-op edema, gait stability/changes. Pt will continue to benefit from skilled PT services to address deficits and improve function.   OBJECTIVE IMPAIRMENTS: Abnormal gait, decreased activity tolerance,  decreased balance, decreased mobility, difficulty walking, decreased ROM, decreased strength, hypomobility, increased edema, impaired flexibility, and pain.   ACTIVITY LIMITATIONS: lifting, standing, squatting, stairs, transfers, and locomotion level  PARTICIPATION LIMITATIONS: driving, community activity, yard work, and Aeronautical engineer  PERSONAL FACTORS: 3 comorbidities (CVD/CAD, anxiety, dyslipidemia) are also affecting patient's functional outcome.   REHAB POTENTIAL: Good  CLINICAL DECISION MAKING: Evolving/moderate complexity  EVALUATION COMPLEXITY: Moderate   GOALS: Goals reviewed with patient? Yes  SHORT TERM GOALS: Target date: 02/01/2024  Pt will be independent with HEP to improve strength and decrease ankle pain to improve pain-free function at home and work. Baseline: 01/11/24: Baseline home exercises reviewed.      02/26/24: Pt recounts exercise well and is maintaining HEP.  Goal status: ACHIEVED  Pt will attain dorsiflexion to 10 degrees or greater by 8 weeks as needed for normalized gait pattern Baseline: 01/11/24: -15 deg DF at baseline/eval.     02/26/24: DF > 10 deg Goal status: ACHIEVED   LONG TERM GOALS: Target date: 05/13/2024  Pt will ambulate with normalized gait pattern for 660 feet or greater without reproduction of ankle pain and without AD or boot indicative of ability to perform normal community-level ambulation Baseline: 01/11/24: Ambulating home-level distance with bilateral crutches and CAM boot, partial weightbearing 25 lbs.     02/26/24: Pt is able to ambulate at community-level distance  with moderate gait deviation/antalgic pattern during last 1-2 laps.  Goal status: IN PROGRESS/ON-GOING  2.  Pt will negotiate full flight of steps with reciprocal pattern and no major deviations or pain as needed for negotiating 2-story home with upstairs primary bedroom.  Baseline: 01/11/24: Pt has to scoot on stairs and use knee scooter at top/bottom of steps.     02/26/24: Pt able to complete reciprocal stair climb/descent safety.  Goal status: ACHIEVED   3.  Pt will increase LEFS score to 60/80 or greater in order demonstrate clinically significant improvement in ankle pain/level of function.       Baseline: 01/11/24: 21/80 = 26.3%    02/26/24: 43/80 = 53.8% Goal status: IN PROGRESS  4.  Pt will increase strength of tested ankle musculature to 4+/5 or greater MMT grade in order to demonstrate improvement in strength and function  Baseline: 01/11/24: 2 to 3+ ankle strength per MMT (see chart above).        02/26/24: Met for dorsiflexion and eversion, not met for plantarflexors and inversion.  Goal status: IN PROGRESS   PLAN: PT FREQUENCY: 1-2x/week  PT DURATION: 6-8 weeks  PLANNED INTERVENTIONS: Therapeutic exercises, Therapeutic activity, Neuromuscular re-education, Balance training, Gait training, Patient/Family education, Self Care, Joint mobilization, Joint manipulation, Vestibular training, Canalith repositioning, Orthotic/Fit training, DME instructions, Dry Needling, Electrical stimulation, Spinal manipulation, Spinal mobilization, Cryotherapy, Moist heat, Taping, Traction, Ultrasound, Ionotophoresis 4mg /ml Dexamethasone, Manual therapy, and Re-evaluation.  PLAN FOR NEXT SESSION: Progressive CKC loading, double-limb calf raise progression, balance and proprioceptive drills. Progress as tolerated per protocol.    Venetia Endo, PT, DPT #[id number]  Venetia ONEIDA Endo, PT 03/18/2024, 9:59 AM

## 2024-03-25 ENCOUNTER — Ambulatory Visit: Admitting: Physical Therapy

## 2024-03-25 ENCOUNTER — Encounter: Payer: Self-pay | Admitting: Physical Therapy

## 2024-03-25 DIAGNOSIS — M6281 Muscle weakness (generalized): Secondary | ICD-10-CM

## 2024-03-25 DIAGNOSIS — R262 Difficulty in walking, not elsewhere classified: Secondary | ICD-10-CM

## 2024-03-25 DIAGNOSIS — M25572 Pain in left ankle and joints of left foot: Secondary | ICD-10-CM

## 2024-03-25 NOTE — Therapy (Signed)
 OUTPATIENT PHYSICAL THERAPY ANKLE POST-OP TREATMENT  Patient Name: Jake Wagner MRN: [id number] DOB:16-Aug-1960, 63 y.o., male Today's Date: 03/25/2024  END OF SESSION:  PT End of Session - 03/25/24 0910     Visit Number 15    Number of Visits 21    Date for Recertification  03/21/24    PT Start Time 0907    PT Stop Time 0946    PT Time Calculation (min) 39 min    Equipment Utilized During Treatment --   bilateral axillary crutches, L CAM boot   Activity Tolerance Patient tolerated treatment well    Behavior During Therapy WFL for tasks assessed/performed            Past Medical History:  Diagnosis Date   Anginal pain    History of chicken pox    Hypercholesteremia    Past Surgical History:  Procedure Laterality Date   CARDIAC CATHETERIZATION Left 01/07/2016   Procedure: Left Heart Cath and Coronary Angiography;  Surgeon: Cara JONETTA Lovelace, MD;  Location: [facility];  Service: Cardiovascular;  Laterality: Left;   Patient Active Problem List   Diagnosis Date Noted   Achilles tendon tear 12/18/2023   Right knee pain 04/14/2023   History of pneumonia 02/01/2023   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 04/29/2022   History of COVID-19 08/01/2021   Left shoulder pain 07/27/2021   Respiratory illness 07/12/2021   Acute cough 05/20/2021   Erectile dysfunction 01/30/2021   Right ankle pain 03/12/2020   Stress 10/21/2018   Rash 10/21/2018   Blood in semen 10/21/2018   Memory change 01/14/2018   Hemorrhoid 01/14/2018   History of malignant melanoma of skin 06/15/2017   History of melanoma in situ 05/30/2017   Aortic aneurysm 10/22/2016   Chronic right shoulder pain 05/18/2016   Diastolic dysfunction 04/27/2016   Dilated aortic root 04/27/2016   Adhesive capsulitis of right shoulder 03/16/2016   Light headedness 02/21/2016   Right shoulder pain 02/18/2016   Fatigue 10/14/2015   CAD (coronary artery disease) 10/14/2015   History of colonic polyps  08/19/2015   Hypercholesterolemia 06/22/2015   History of knee surgery 06/22/2015   Health care maintenance 06/22/2015    PCP: Allena Hamilton, MD  REFERRING PROVIDER: Kaitlin M Picone, PA-C  REFERRING DIAG: [phone] (ICD-10-CM) - Strain of left Achilles tendon, initial encounter   Rationale for Evaluation and Treatment: Habilitation  THERAPY DIAG: Pain in left ankle and joints of left foot  Difficulty in walking, not elsewhere classified  Muscle weakness (generalized)  ONSET DATE: L Achilles tendon repair (DOS 12/13/23)   FOLLOW-UP APPT SCHEDULED WITH REFERRING PROVIDER: Yes ; in 3 weeks from initial eval, next f/u with surgeon's office  PERTINENT HISTORY: Pt is a 63 year old male s/p L Achilles tendon repair (DOS: 12/13/23). Patient reports no post-op complications. Patient reports some tenderness along Achilles tendon region. Pt has upstairs bedroom and scoots on bottom to negotiate steps.   PAIN:    Pain Intensity: Present: 0/10, Worst: 0/10 Pain location: Achilles tendon  Radiating: No  Swelling: Yes ; minor swelling on ankle Numbness/Tingling: Yes; seldom tingling in foot  History of prior back, hip, knee, or ankle injury, pain, surgery, or therapy: Yes; L ankle dislocation in 8th grade, no other significant Hx  Imaging: No  Typical footwear:  Red flags: Negative for personal history of cancer, chills/fever, night sweats, nausea, vomiting, unrelenting pain): Negative  PRECAUTIONS: Other: Weightbearing restriction and no DF beyond neutral   WEIGHT BEARING RESTRICTIONS: Yes  50 lbs PWB, increase 25 lbs each successive week starting 01/11/24  FALLS: Has patient fallen in last 6 months? No  Living Environment Lives with: lives with their spouse and brother-in-law Lives in: House/apartment Stairs: Yes: Internal: 16 steps; on left going up and External: 3 steps; on right going up (going into garage) Has following equipment at home: Crutches, knee scooter   Prior level of  function: Independent  Occupational demands: Photography business  Hobbies: Working on yard, psychologist, clinical  Patient Goals: Return to baseline    OBJECTIVE (data from initial evaluation unless otherwise dated):   Patient Surveys  LEFS: 21/80 = 26.3%   Gross Musculoskeletal Assessment Bulk: Normal Tone: Normal Moderate ankle edema, mild erythema along incision and ankle. No sign of acute infection or delayed healing.   GAIT: Distance walked: 30 ft Assistive device utilized: Bilateral axillary crutches Level of assistance: SBA Comments: PWB LLE in bilateral axillary crutches in CAM boot only, proper use of upper limb support to offload surgical LE   AROM AROM (Normal range in degrees) AROM  01/11/24 AROM 02/26/24   Right Left Left  Knee     Flexion (135) WNL WNL   Extension (0) WNL WNL        Ankle     Dorsiflexion (20)  -15 12 (18 passively)  Plantarflexion (50)  40 WNL  Inversion (35)  20 35  Eversion (15)  WNL WNL  (* = pain; Blank rows = not tested)   LE MMT: MMT (out of 5) Right 01/11/24 Left 01/11/24 Right 02/26/24 Left 02/26/24  Hip flexion 4- 4    Hip extension      Hip abduction 4+ 4+    Hip adduction      Hip internal rotation      Hip external rotation      Knee flexion 5 5    Knee extension 5 5    Ankle dorsiflexion 5 3+  5  Ankle plantarflexion 5 2  3+ (no single- limb heel raise yet)  Ankle inversion 5 3+  4  Ankle eversion 5 3+  4+  (* = pain; Blank rows = not tested)  Sensation Grossly intact to light touch throughout bilateral LEs as determined by testing dermatomes L2-S2. Proprioception, stereognosis, and hot/cold testing deferred on this date.  Reflexes Deferred  Palpation Mid-substance Achilles 1, gastroc-Achilles musculotendinous junction 1, insertional Achilles 0, calcaneal tuberosity 0 Graded on 0-4 scale (0 = no pain, 1 = pain, 2 = pain with wincing/grimacing/flinching, 3 = pain with withdrawal, 4 = unwilling to allow palpation),  (Blank rows = not tested)  Passive Accessory Motion Deferred  VASCULAR Dorsalis pedis and posterior tibial pulses are palpable Capillary refill WNL      TODAY'S TREATMENT: DATE: 03/25/2024  SUBJECTIVE STATEMENT:   Pt reports no new concerns at arrival. No pain at arrival. He is preparing his mother-in-law's house for sale. He reports completing heel raises, but he has not completed his other closed-chain drills.       Therapeutic Exercise - for improved soft tissue flexibility and extensibility as needed for ROM, improved strength as needed to improve performance of CKC  activities/functional movements    SciFit seated elliptical; Level 8, x 6 minutes - for improved soft tissue mobility and increased tissue temperature to improve muscle performance    Standing heel raise; 2 x 15, slow eccentric phase (3-5 sec)   Single limb stance on Airex, with Med ball toss to rebounder; 3 x 15 with 6.6-lb ball  Seated heel raise; 2 x 15, 20-lb cuff weight (10-lb + 10-lb) resting on patient's knee  *not today* Tandem stance on Airex; x 30 sec each position Tandem walk on Airex; 4x D/B, forward stepping only  Total Gym; Level 22; double-limb heel raise; 3 x 10  Seated towel scrunch; 2 x 1 min, with added 1-lb Dbell on towel Reclined ankle Tband; PF x 20 ea dir with Black Tband  Reclined ankle Tband; inversion and eversion x 10 ea dir with Black Tband   -for HEP review  -PT holding band for INV   Ankle pump; PF/DF AROM as tolerated; x20 into PF and DF Seated ankle inversion/eversion with towel; x 20 Active straight leg raise; 2 x 10    Therapeutic Activity - to improve capacity for gait with less reliance on AD, pre-gait activities to improve gait stability and adequate weight shift   Minisquat, with 10-lb Goblet hold; 2 x 15, with mirror feedback for technique Forward step-up to hurdle step; 12-inch step, staircase in center of gym; 2 x 12 Forward lunge to BOSU, stepping on round  side; 2 x 12 Total Gym single-limb squat; 3 x 15, Level 22  PATIENT EDUCATION: HEP reviewed. Discussed using slow eccentric phase for calf raises.    *not today* Forward to retro step along blue agility ladder; 5x D/B Toe tapping on 6-inch step to simulate side-to-side weight shifting with ambulation; 2 x 10, alt Forward gait in hallway, focus on heel to toe progression and symmetrical weight shift; x3 D/B 70-ft hallway   PATIENT EDUCATION:  Education details: see above for patient education details Person educated: Patient Education method: Explanation, Demonstration, and Handouts Education comprehension: verbalized understanding and returned demonstration   HOME EXERCISE PROGRAM:  Access Code: [email or web address] URL: [email or web address] Date: 03/18/2024 Prepared by: Venetia Endo  Exercises - Long Sitting Ankle Plantar Flexion with Resistance  - 1 x daily - 7 x weekly - 2 sets - 10 reps - Ankle Inversion with Resistance  - 1 x daily - 7 x weekly - 2 sets - 10 reps - Seated Heel Raise  - 1-2 x daily - 7 x weekly - 2-3 sets - 10 reps - Standing Heel Raise  - 1-2 x daily - 7 x weekly - 2-3 sets - 15 reps - Single Leg Stance on Foam Pad  - 4-5 x weekly - 3 sets - 30sec hold - Goblet Squat with Kettlebell  - 1 x daily - 4-5 x weekly - 2 sets - 12-15 reps - Lunge with Counter Support  - 1 x daily - 4-5 x weekly - 2 sets - 12-15 reps - Runner's Step Up/Down  - 1 x daily - 4-5 x weekly - 2 sets - 12-15 reps   ASSESSMENT:  CLINICAL IMPRESSION: Patient tolerates higher volume and intensity CKC drills well without significant pain. He has normal ankle ROM and is doing well with double-limb calf raises in standing and seated position. We will progress with single-limb heel raises when permitted per protocol. Patient has expected post-operative deficits in ankle/plantarflexor strength, post-op edema, gait stability/changes. Pt will continue to benefit from skilled PT services to  address deficits and improve function.   OBJECTIVE IMPAIRMENTS: Abnormal gait, decreased activity tolerance, decreased balance, decreased mobility, difficulty walking, decreased ROM, decreased strength, hypomobility, increased edema, impaired flexibility, and pain.   ACTIVITY LIMITATIONS: lifting, standing, squatting, stairs, transfers, and locomotion level  PARTICIPATION LIMITATIONS: driving, community activity, yard work, and aeronautical engineer  PERSONAL FACTORS: 3 comorbidities (CVD/CAD, anxiety,  dyslipidemia) are also affecting patient's functional outcome.   REHAB POTENTIAL: Good  CLINICAL DECISION MAKING: Evolving/moderate complexity  EVALUATION COMPLEXITY: Moderate   GOALS: Goals reviewed with patient? Yes  SHORT TERM GOALS: Target date: 02/01/2024  Pt will be independent with HEP to improve strength and decrease ankle pain to improve pain-free function at home and work. Baseline: 01/11/24: Baseline home exercises reviewed.      02/26/24: Pt recounts exercise well and is maintaining HEP.  Goal status: ACHIEVED  Pt will attain dorsiflexion to 10 degrees or greater by 8 weeks as needed for normalized gait pattern Baseline: 01/11/24: -15 deg DF at baseline/eval.     02/26/24: DF > 10 deg Goal status: ACHIEVED   LONG TERM GOALS: Target date: 05/13/2024  Pt will ambulate with normalized gait pattern for 660 feet or greater without reproduction of ankle pain and without AD or boot indicative of ability to perform normal community-level ambulation Baseline: 01/11/24: Ambulating home-level distance with bilateral crutches and CAM boot, partial weightbearing 25 lbs.     02/26/24: Pt is able to ambulate at community-level distance with moderate gait deviation/antalgic pattern during last 1-2 laps.  Goal status: IN PROGRESS/ON-GOING  2.  Pt will negotiate full flight of steps with reciprocal pattern and no major deviations or pain as needed for negotiating 2-story home with upstairs primary  bedroom.  Baseline: 01/11/24: Pt has to scoot on stairs and use knee scooter at top/bottom of steps.     02/26/24: Pt able to complete reciprocal stair climb/descent safety.  Goal status: ACHIEVED   3.  Pt will increase LEFS score to 60/80 or greater in order demonstrate clinically significant improvement in ankle pain/level of function.       Baseline: 01/11/24: 21/80 = 26.3%    02/26/24: 43/80 = 53.8% Goal status: IN PROGRESS  4.  Pt will increase strength of tested ankle musculature to 4+/5 or greater MMT grade in order to demonstrate improvement in strength and function  Baseline: 01/11/24: 2 to 3+ ankle strength per MMT (see chart above).        02/26/24: Met for dorsiflexion and eversion, not met for plantarflexors and inversion.  Goal status: IN PROGRESS   PLAN: PT FREQUENCY: 1-2x/week  PT DURATION: 6-8 weeks  PLANNED INTERVENTIONS: Therapeutic exercises, Therapeutic activity, Neuromuscular re-education, Balance training, Gait training, Patient/Family education, Self Care, Joint mobilization, Joint manipulation, Vestibular training, Canalith repositioning, Orthotic/Fit training, DME instructions, Dry Needling, Electrical stimulation, Spinal manipulation, Spinal mobilization, Cryotherapy, Moist heat, Taping, Traction, Ultrasound, Ionotophoresis 4mg /ml Dexamethasone, Manual therapy, and Re-evaluation.  PLAN FOR NEXT SESSION: Progressive CKC loading, double-limb calf raise progression, balance and proprioceptive drills. Progress as tolerated per protocol.    Venetia Endo, PT, DPT #[id number]  Venetia ONEIDA Endo, PT 03/25/2024, 9:47 AM

## 2024-04-03 ENCOUNTER — Encounter: Payer: Self-pay | Admitting: Physical Therapy

## 2024-04-03 ENCOUNTER — Ambulatory Visit: Attending: Orthopedic Surgery | Admitting: Physical Therapy

## 2024-04-03 DIAGNOSIS — R262 Difficulty in walking, not elsewhere classified: Secondary | ICD-10-CM | POA: Diagnosis present

## 2024-04-03 DIAGNOSIS — M25572 Pain in left ankle and joints of left foot: Secondary | ICD-10-CM | POA: Diagnosis present

## 2024-04-03 DIAGNOSIS — M6281 Muscle weakness (generalized): Secondary | ICD-10-CM | POA: Insufficient documentation

## 2024-04-03 NOTE — Therapy (Signed)
 OUTPATIENT PHYSICAL THERAPY ANKLE POST-OP TREATMENT  Patient Name: Jake Wagner MRN: [id number] DOB:10-Sep-1960, 63 y.o., male Today's Date: 04/03/2024  END OF SESSION:  PT End of Session - 04/03/24 0859     Visit Number 16    Number of Visits 21    PT Start Time 0900    PT Stop Time 0941    PT Time Calculation (min) 41 min    Equipment Utilized During Treatment --   bilateral axillary crutches, L CAM boot   Activity Tolerance Patient tolerated treatment well    Behavior During Therapy WFL for tasks assessed/performed          Past Medical History:  Diagnosis Date   Anginal pain    History of chicken pox    Hypercholesteremia    Past Surgical History:  Procedure Laterality Date   CARDIAC CATHETERIZATION Left 01/07/2016   Procedure: Left Heart Cath and Coronary Angiography;  Surgeon: Cara JONETTA Lovelace, MD;  Location: [facility];  Service: Cardiovascular;  Laterality: Left;   Patient Active Problem List   Diagnosis Date Noted   Achilles tendon tear 12/18/2023   Right knee pain 04/14/2023   History of pneumonia 02/01/2023   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 04/29/2022   History of COVID-19 08/01/2021   Left shoulder pain 07/27/2021   Respiratory illness 07/12/2021   Acute cough 05/20/2021   Erectile dysfunction 01/30/2021   Right ankle pain 03/12/2020   Stress 10/21/2018   Rash 10/21/2018   Blood in semen 10/21/2018   Memory change 01/14/2018   Hemorrhoid 01/14/2018   History of malignant melanoma of skin 06/15/2017   History of melanoma in situ 05/30/2017   Aortic aneurysm 10/22/2016   Chronic right shoulder pain 05/18/2016   Diastolic dysfunction 04/27/2016   Dilated aortic root 04/27/2016   Adhesive capsulitis of right shoulder 03/16/2016   Light headedness 02/21/2016   Right shoulder pain 02/18/2016   Fatigue 10/14/2015   CAD (coronary artery disease) 10/14/2015   History of colonic polyps 08/19/2015   Hypercholesterolemia 06/22/2015    History of knee surgery 06/22/2015   Health care maintenance 06/22/2015    PCP: Allena Hamilton, MD  REFERRING PROVIDER: Kaitlin M Picone, PA-C  REFERRING DIAG: [phone] (ICD-10-CM) - Strain of left Achilles tendon, initial encounter   Rationale for Evaluation and Treatment: Habilitation  THERAPY DIAG: Pain in left ankle and joints of left foot  Difficulty in walking, not elsewhere classified  Muscle weakness (generalized)  ONSET DATE: L Achilles tendon repair (DOS 12/13/23)   FOLLOW-UP APPT SCHEDULED WITH REFERRING PROVIDER: Yes ; in 3 weeks from initial eval, next f/u with surgeon's office  PERTINENT HISTORY: Pt is a 63 year old male s/p L Achilles tendon repair (DOS: 12/13/23). Patient reports no post-op complications. Patient reports some tenderness along Achilles tendon region. Pt has upstairs bedroom and scoots on bottom to negotiate steps.   PAIN:    Pain Intensity: Present: 0/10, Worst: 0/10 Pain location: Achilles tendon  Radiating: No  Swelling: Yes ; minor swelling on ankle Numbness/Tingling: Yes; seldom tingling in foot  History of prior back, hip, knee, or ankle injury, pain, surgery, or therapy: Yes; L ankle dislocation in 8th grade, no other significant Hx  Imaging: No  Typical footwear:  Red flags: Negative for personal history of cancer, chills/fever, night sweats, nausea, vomiting, unrelenting pain): Negative  PRECAUTIONS: Other: Weightbearing restriction and no DF beyond neutral   WEIGHT BEARING RESTRICTIONS: Yes 50 lbs PWB, increase 25 lbs each successive week starting  01/11/24  FALLS: Has patient fallen in last 6 months? No  Living Environment Lives with: lives with their spouse and brother-in-law Lives in: House/apartment Stairs: Yes: Internal: 16 steps; on left going up and External: 3 steps; on right going up (going into garage) Has following equipment at home: Crutches, knee scooter   Prior level of function: Independent  Occupational demands:  Photography business  Hobbies: Working on yard, psychologist, clinical  Patient Goals: Return to baseline    OBJECTIVE (data from initial evaluation unless otherwise dated):   Patient Surveys  LEFS: 21/80 = 26.3%   Gross Musculoskeletal Assessment Bulk: Normal Tone: Normal Moderate ankle edema, mild erythema along incision and ankle. No sign of acute infection or delayed healing.   GAIT: Distance walked: 30 ft Assistive device utilized: Bilateral axillary crutches Level of assistance: SBA Comments: PWB LLE in bilateral axillary crutches in CAM boot only, proper use of upper limb support to offload surgical LE   AROM AROM (Normal range in degrees) AROM  01/11/24 AROM 02/26/24   Right Left Left  Knee     Flexion (135) WNL WNL   Extension (0) WNL WNL        Ankle     Dorsiflexion (20)  -15 12 (18 passively)  Plantarflexion (50)  40 WNL  Inversion (35)  20 35  Eversion (15)  WNL WNL  (* = pain; Blank rows = not tested)   LE MMT: MMT (out of 5) Right 01/11/24 Left 01/11/24 Right 02/26/24 Left 02/26/24  Hip flexion 4- 4    Hip extension      Hip abduction 4+ 4+    Hip adduction      Hip internal rotation      Hip external rotation      Knee flexion 5 5    Knee extension 5 5    Ankle dorsiflexion 5 3+  5  Ankle plantarflexion 5 2  3+ (no single- limb heel raise yet)  Ankle inversion 5 3+  4  Ankle eversion 5 3+  4+  (* = pain; Blank rows = not tested)  Sensation Grossly intact to light touch throughout bilateral LEs as determined by testing dermatomes L2-S2. Proprioception, stereognosis, and hot/cold testing deferred on this date.  Reflexes Deferred  Palpation Mid-substance Achilles 1, gastroc-Achilles musculotendinous junction 1, insertional Achilles 0, calcaneal tuberosity 0 Graded on 0-4 scale (0 = no pain, 1 = pain, 2 = pain with wincing/grimacing/flinching, 3 = pain with withdrawal, 4 = unwilling to allow palpation), (Blank rows = not tested)  Passive Accessory  Motion Deferred  VASCULAR Dorsalis pedis and posterior tibial pulses are palpable Capillary refill WNL      TODAY'S TREATMENT: DATE: 04/03/2024  SUBJECTIVE STATEMENT:   Pt reports some soreness in heel with prolonged standing. Pt denies notable pain at arrival. Pt reports doing well with his HEP. He reports some difficulty with sleeping due to brother-in-law with special needs being up throughout night.       Therapeutic Exercise - for improved soft tissue flexibility and extensibility as needed for ROM, improved strength as needed to improve performance of CKC  activities/functional movements    SciFit seated elliptical; Level 8, x 6 minutes - for improved soft tissue mobility and increased tissue temperature to improve muscle performance    Single-limb wobble board (PF/DF); x20 alternating back/forth intermittent UE support on adjacent railing   Total Gym; 2 up, 1 down; single limb eccentric; 2 x 10; Level 15   Standing heel raise; 2  x 15, slow eccentric phase (3-5 sec), with 9-lb Dbells in bilat hands   Single limb stance on Airex, with Med ball toss to rebounder; 3 x 15 with 6.6-lb ball    *not today* Seated heel raise; 2 x 15, 20-lb cuff weight (10-lb + 10-lb) resting on patient's knee Tandem stance on Airex; x 30 sec each position Tandem walk on Airex; 4x D/B, forward stepping only  Total Gym; Level 22; double-limb heel raise; 3 x 10  Seated towel scrunch; 2 x 1 min, with added 1-lb Dbell on towel Reclined ankle Tband; PF x 20 ea dir with Black Tband  Reclined ankle Tband; inversion and eversion x 10 ea dir with Black Tband   -for HEP review  -PT holding band for INV   Ankle pump; PF/DF AROM as tolerated; x20 into PF and DF Seated ankle inversion/eversion with towel; x 20 Active straight leg raise; 2 x 10    Therapeutic Activity - to improve capacity for gait with less reliance on AD, pre-gait activities to improve gait stability and adequate weight  shift  Minisquat, with 10-lb Goblet hold; 2 x 15, with mirror feedback for technique Forward step-up to hurdle step; 12-inch step + Airex, staircase in center of gym; 2 x 12 Forward lunge to BOSU, stepping on round side; 2 x 12  TRX single-leg skater squats; 2 x 10  PATIENT EDUCATION: HEP reviewed. Discussed addition of moderate-weight dumbbells for double-limb calf raises at home with goal of working toward single-limb heel raise at 4 months post-op.    *not today* Total Gym single-limb squat; 3 x 15, Level 22 Forward to retro step along blue agility ladder; 5x D/B Toe tapping on 6-inch step to simulate side-to-side weight shifting with ambulation; 2 x 10, alt Forward gait in hallway, focus on heel to toe progression and symmetrical weight shift; x3 D/B 70-ft hallway   PATIENT EDUCATION:  Education details: see above for patient education details Person educated: Patient Education method: Explanation, Demonstration, and Handouts Education comprehension: verbalized understanding and returned demonstration   HOME EXERCISE PROGRAM:  Access Code: [email or web address] URL: [email or web address] Date: 03/18/2024 Prepared by: Venetia Endo  Exercises - Long Sitting Ankle Plantar Flexion with Resistance  - 1 x daily - 7 x weekly - 2 sets - 10 reps - Ankle Inversion with Resistance  - 1 x daily - 7 x weekly - 2 sets - 10 reps - Seated Heel Raise  - 1-2 x daily - 7 x weekly - 2-3 sets - 10 reps - Standing Heel Raise  - 1-2 x daily - 7 x weekly - 2-3 sets - 15 reps - Single Leg Stance on Foam Pad  - 4-5 x weekly - 3 sets - 30sec hold - Goblet Squat with Kettlebell  - 1 x daily - 4-5 x weekly - 2 sets - 12-15 reps - Lunge with Counter Support  - 1 x daily - 4-5 x weekly - 2 sets - 12-15 reps - Runner's Step Up/Down  - 1 x daily - 4-5 x weekly - 2 sets - 12-15 reps   ASSESSMENT:  CLINICAL IMPRESSION: Patient is making excellent progress with performance of closed-chain exercises  and compound movements in outpatient rehab; he is most limited with muscle performance of plantarflexors at this time as expected following Achilles tendon repair. He is notably challenged with modified eccentrics on Total Gym with partial body weight. Pt is able to complete weighted double-limb heel raises (2 9-lb Dbells) without notable difficulty. We will  continue toward full single-limb heel raise at 4 months post-op (pt at 16 weeks post-op today). Patient has expected post-operative deficits in ankle/plantarflexor strength, post-op edema, gait stability/changes. Pt will continue to benefit from skilled PT services to address deficits and improve function.   OBJECTIVE IMPAIRMENTS: Abnormal gait, decreased activity tolerance, decreased balance, decreased mobility, difficulty walking, decreased ROM, decreased strength, hypomobility, increased edema, impaired flexibility, and pain.   ACTIVITY LIMITATIONS: lifting, standing, squatting, stairs, transfers, and locomotion level  PARTICIPATION LIMITATIONS: driving, community activity, yard work, and aeronautical engineer  PERSONAL FACTORS: 3 comorbidities (CVD/CAD, anxiety, dyslipidemia) are also affecting patient's functional outcome.   REHAB POTENTIAL: Good  CLINICAL DECISION MAKING: Evolving/moderate complexity  EVALUATION COMPLEXITY: Moderate   GOALS: Goals reviewed with patient? Yes  SHORT TERM GOALS: Target date: 02/01/2024  Pt will be independent with HEP to improve strength and decrease ankle pain to improve pain-free function at home and work. Baseline: 01/11/24: Baseline home exercises reviewed.      02/26/24: Pt recounts exercise well and is maintaining HEP.  Goal status: ACHIEVED  Pt will attain dorsiflexion to 10 degrees or greater by 8 weeks as needed for normalized gait pattern Baseline: 01/11/24: -15 deg DF at baseline/eval.     02/26/24: DF > 10 deg Goal status: ACHIEVED   LONG TERM GOALS: Target date: 05/13/2024  Pt will ambulate with  normalized gait pattern for 660 feet or greater without reproduction of ankle pain and without AD or boot indicative of ability to perform normal community-level ambulation Baseline: 01/11/24: Ambulating home-level distance with bilateral crutches and CAM boot, partial weightbearing 25 lbs.     02/26/24: Pt is able to ambulate at community-level distance with moderate gait deviation/antalgic pattern during last 1-2 laps.  Goal status: IN PROGRESS/ON-GOING  2.  Pt will negotiate full flight of steps with reciprocal pattern and no major deviations or pain as needed for negotiating 2-story home with upstairs primary bedroom.  Baseline: 01/11/24: Pt has to scoot on stairs and use knee scooter at top/bottom of steps.     02/26/24: Pt able to complete reciprocal stair climb/descent safety.  Goal status: ACHIEVED   3.  Pt will increase LEFS score to 60/80 or greater in order demonstrate clinically significant improvement in ankle pain/level of function.       Baseline: 01/11/24: 21/80 = 26.3%    02/26/24: 43/80 = 53.8% Goal status: IN PROGRESS  4.  Pt will increase strength of tested ankle musculature to 4+/5 or greater MMT grade in order to demonstrate improvement in strength and function  Baseline: 01/11/24: 2 to 3+ ankle strength per MMT (see chart above).        02/26/24: Met for dorsiflexion and eversion, not met for plantarflexors and inversion.  Goal status: IN PROGRESS   PLAN: PT FREQUENCY: 1-2x/week  PT DURATION: 6-8 weeks  PLANNED INTERVENTIONS: Therapeutic exercises, Therapeutic activity, Neuromuscular re-education, Balance training, Gait training, Patient/Family education, Self Care, Joint mobilization, Joint manipulation, Vestibular training, Canalith repositioning, Orthotic/Fit training, DME instructions, Dry Needling, Electrical stimulation, Spinal manipulation, Spinal mobilization, Cryotherapy, Moist heat, Taping, Traction, Ultrasound, Ionotophoresis 4mg /ml Dexamethasone, Manual therapy, and  Re-evaluation.  PLAN FOR NEXT SESSION: Progressive CKC loading, double-limb calf raise progression, balance and proprioceptive drills. Progress as tolerated per protocol. Working toward single-leg heel raise progression at 4-6 months.    Venetia Endo, PT, DPT #[id number]  Venetia ONEIDA Endo, PT 04/03/2024, 9:59 AM

## 2024-04-09 ENCOUNTER — Ambulatory Visit: Admitting: Physical Therapy

## 2024-04-09 DIAGNOSIS — M25572 Pain in left ankle and joints of left foot: Secondary | ICD-10-CM | POA: Diagnosis not present

## 2024-04-09 DIAGNOSIS — R262 Difficulty in walking, not elsewhere classified: Secondary | ICD-10-CM

## 2024-04-09 DIAGNOSIS — M6281 Muscle weakness (generalized): Secondary | ICD-10-CM

## 2024-04-09 NOTE — Therapy (Signed)
 OUTPATIENT PHYSICAL THERAPY ANKLE POST-OP TREATMENT  Patient Name: Jake Wagner MRN: [id number] DOB:[birth year], 63 y.o., male Today's Date: 04/09/2024  END OF SESSION:  PT End of Session - 04/09/24 0810     Visit Number 17    Number of Visits 21    PT Start Time 0812    PT Stop Time 0854    PT Time Calculation (min) 42 min    Equipment Utilized During Treatment --   bilateral axillary crutches, L CAM boot   Activity Tolerance Patient tolerated treatment well    Behavior During Therapy WFL for tasks assessed/performed           Past Medical History:  Diagnosis Date   Anginal pain    History of chicken pox    Hypercholesteremia    Past Surgical History:  Procedure Laterality Date   CARDIAC CATHETERIZATION Left 01/07/2016   Procedure: Left Heart Cath and Coronary Angiography;  Surgeon: Cara JONETTA Lovelace, MD;  Location: [facility];  Service: Cardiovascular;  Laterality: Left;   Patient Active Problem List   Diagnosis Date Noted   Achilles tendon tear 12/18/2023   Right knee pain 04/14/2023   History of pneumonia 02/01/2023   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 04/29/2022   History of COVID-19 08/01/2021   Left shoulder pain 07/27/2021   Respiratory illness 07/12/2021   Acute cough 05/20/2021   Erectile dysfunction 01/30/2021   Right ankle pain 03/12/2020   Stress 10/21/2018   Rash 10/21/2018   Blood in semen 10/21/2018   Memory change 01/14/2018   Hemorrhoid 01/14/2018   History of malignant melanoma of skin 06/15/2017   History of melanoma in situ 05/30/2017   Aortic aneurysm 10/22/2016   Chronic right shoulder pain 05/18/2016   Diastolic dysfunction 04/27/2016   Dilated aortic root 04/27/2016   Adhesive capsulitis of right shoulder 03/16/2016   Light headedness 02/21/2016   Right shoulder pain 02/18/2016   Fatigue 10/14/2015   CAD (coronary artery disease) 10/14/2015   History of colonic polyps 08/19/2015   Hypercholesterolemia 06/22/2015    History of knee surgery 06/22/2015   Health care maintenance 06/22/2015    PCP: Allena Hamilton, MD  REFERRING PROVIDER: Kaitlin M Picone, PA-C  REFERRING DIAG: [phone] (ICD-10-CM) - Strain of left Achilles tendon, initial encounter   Rationale for Evaluation and Treatment: Habilitation  THERAPY DIAG: Pain in left ankle and joints of left foot  Difficulty in walking, not elsewhere classified  Muscle weakness (generalized)  ONSET DATE: L Achilles tendon repair (DOS 12/13/23)   FOLLOW-UP APPT SCHEDULED WITH REFERRING PROVIDER: Yes ; in 3 weeks from initial eval, next f/u with surgeon's office  PERTINENT HISTORY: Pt is a 63 year old male s/p L Achilles tendon repair (DOS: 12/13/23). Patient reports no post-op complications. Patient reports some tenderness along Achilles tendon region. Pt has upstairs bedroom and scoots on bottom to negotiate steps.   PAIN:    Pain Intensity: Present: 0/10, Worst: 0/10 Pain location: Achilles tendon  Radiating: No  Swelling: Yes ; minor swelling on ankle Numbness/Tingling: Yes; seldom tingling in foot  History of prior back, hip, knee, or ankle injury, pain, surgery, or therapy: Yes; L ankle dislocation in 8th grade, no other significant Hx  Imaging: No  Typical footwear:  Red flags: Negative for personal history of cancer, chills/fever, night sweats, nausea, vomiting, unrelenting pain): Negative  PRECAUTIONS: Other: Weightbearing restriction and no DF beyond neutral   WEIGHT BEARING RESTRICTIONS: Yes 50 lbs PWB, increase 25 lbs each successive week  starting 01/11/24  FALLS: Has patient fallen in last 6 months? No  Living Environment Lives with: lives with their spouse and brother-in-law Lives in: House/apartment Stairs: Yes: Internal: 16 steps; on left going up and External: 3 steps; on right going up (going into garage) Has following equipment at home: Crutches, knee scooter   Prior level of function: Independent  Occupational demands:  Photography business  Hobbies: Working on yard, psychologist, clinical  Patient Goals: Return to baseline    OBJECTIVE (data from initial evaluation unless otherwise dated):   Patient Surveys  LEFS: 21/80 = 26.3%   Gross Musculoskeletal Assessment Bulk: Normal Tone: Normal Moderate ankle edema, mild erythema along incision and ankle. No sign of acute infection or delayed healing.   GAIT: Distance walked: 30 ft Assistive device utilized: Bilateral axillary crutches Level of assistance: SBA Comments: PWB LLE in bilateral axillary crutches in CAM boot only, proper use of upper limb support to offload surgical LE   AROM AROM (Normal range in degrees) AROM  01/11/24 AROM 02/26/24   Right Left Left  Knee     Flexion (135) WNL WNL   Extension (0) WNL WNL        Ankle     Dorsiflexion (20)  -15 12 (18 passively)  Plantarflexion (50)  40 WNL  Inversion (35)  20 35  Eversion (15)  WNL WNL  (* = pain; Blank rows = not tested)   LE MMT: MMT (out of 5) Right 01/11/24 Left 01/11/24 Right 02/26/24 Left 02/26/24  Hip flexion 4- 4    Hip extension      Hip abduction 4+ 4+    Hip adduction      Hip internal rotation      Hip external rotation      Knee flexion 5 5    Knee extension 5 5    Ankle dorsiflexion 5 3+  5  Ankle plantarflexion 5 2  3+ (no single- limb heel raise yet)  Ankle inversion 5 3+  4  Ankle eversion 5 3+  4+  (* = pain; Blank rows = not tested)  Sensation Grossly intact to light touch throughout bilateral LEs as determined by testing dermatomes L2-S2. Proprioception, stereognosis, and hot/cold testing deferred on this date.  Reflexes Deferred  Palpation Mid-substance Achilles 1, gastroc-Achilles musculotendinous junction 1, insertional Achilles 0, calcaneal tuberosity 0 Graded on 0-4 scale (0 = no pain, 1 = pain, 2 = pain with wincing/grimacing/flinching, 3 = pain with withdrawal, 4 = unwilling to allow palpation), (Blank rows = not tested)  Passive Accessory  Motion Deferred  VASCULAR Dorsalis pedis and posterior tibial pulses are palpable Capillary refill WNL      TODAY'S TREATMENT: DATE: 04/09/2024  SUBJECTIVE STATEMENT:   Pt reports intermittent soreness affecting his heel. Pt reports he feels this with prolonged weightbearing activity. Patient is just short of 4 months post-op following L Achilles repair.      Therapeutic Exercise - for improved soft tissue flexibility and extensibility as needed for ROM, improved strength as needed to improve performance of CKC  activities/functional movements    SciFit seated elliptical; Level 8.5, x 5 minutes - for improved soft tissue mobility and increased tissue temperature to improve muscle performance    Single-limb wobble board (PF/DF); x20 alternating back/forth intermittent UE support on adjacent railing   Total Gym; 2 up, 1 down; single limb eccentric; 2 x 10; Level 15   Standing heel raise; 2 x 15, slow eccentric phase (3-5 sec), with 9-lb Dbells  in bilat hands   Single limb stance on Airex, with Med ball toss to rebounder; 1 x 15, 2 x 20 with 6.6-lb ball    *not today* Seated heel raise; 2 x 15, 20-lb cuff weight (10-lb + 10-lb) resting on patient's knee Tandem stance on Airex; x 30 sec each position Tandem walk on Airex; 4x D/B, forward stepping only  Total Gym; Level 22; double-limb heel raise; 3 x 10  Seated towel scrunch; 2 x 1 min, with added 1-lb Dbell on towel Reclined ankle Tband; PF x 20 ea dir with Black Tband  Reclined ankle Tband; inversion and eversion x 10 ea dir with Black Tband   -for HEP review  -PT holding band for INV   Ankle pump; PF/DF AROM as tolerated; x20 into PF and DF Seated ankle inversion/eversion with towel; x 20 Active straight leg raise; 2 x 10    Therapeutic Activity - to improve capacity for gait with less reliance on AD, pre-gait activities to improve gait stability and adequate weight shift     TRX single-leg skater squats; 2 x 10      Lateral stepdown; 6-inch step, staircase center of gym;   Minisquat, with 10-lb Goblet hold; 2 x 15, with mirror feedback for technique  BOSU squat, on flat side; 2 x 10   PATIENT EDUCATION: HEP updated and reviewed. Discussed prognosis/expected progression of PT with subsequent weeks.   *not today* Forward step-up to hurdle step; 12-inch step + Airex, staircase in center of gym; 2 x 12 Total Gym single-limb squat; 3 x 15, Level 22 Forward to retro step along blue agility ladder; 5x D/B Toe tapping on 6-inch step to simulate side-to-side weight shifting with ambulation; 2 x 10, alt Forward gait in hallway, focus on heel to toe progression and symmetrical weight shift; x3 D/B 70-ft hallway   PATIENT EDUCATION:  Education details: see above for patient education details Person educated: Patient Education method: Explanation, Demonstration, and Handouts Education comprehension: verbalized understanding and returned demonstration   HOME EXERCISE PROGRAM:  Access Code: [email or web address] URL: [email or web address] Date: 03/18/2024 Prepared by: Venetia Endo  Exercises - Long Sitting Ankle Plantar Flexion with Resistance  - 1 x daily - 7 x weekly - 2 sets - 10 reps - Ankle Inversion with Resistance  - 1 x daily - 7 x weekly - 2 sets - 10 reps - Seated Heel Raise  - 1-2 x daily - 7 x weekly - 2-3 sets - 10 reps - Standing Heel Raise  - 1-2 x daily - 7 x weekly - 2-3 sets - 15 reps - Single Leg Stance on Foam Pad  - 4-5 x weekly - 3 sets - 30sec hold - Goblet Squat with Kettlebell  - 1 x daily - 4-5 x weekly - 2 sets - 12-15 reps - Lunge with Counter Support  - 1 x daily - 4-5 x weekly - 2 sets - 12-15 reps - Runner's Step Up/Down  - 1 x daily - 4-5 x weekly - 2 sets - 12-15 reps   ASSESSMENT:  CLINICAL IMPRESSION: Patient is continuing to progress with CKC strengthening drills, unipedal stance exercises, and single-limb strengthening with goal to work toward full standing  single-limb heel raise over next 1-2 weeks. Pt has minimal pain at this time, but he does report some heel soreness with prolonged standing/walking activity. HEP was updated today for higher-level CKC compound exercise. Patient has expected post-operative deficits in ankle/plantarflexor strength, post-op edema, gait stability/changes. Pt will continue  to benefit from skilled PT services to address deficits and improve function.   OBJECTIVE IMPAIRMENTS: Abnormal gait, decreased activity tolerance, decreased balance, decreased mobility, difficulty walking, decreased ROM, decreased strength, hypomobility, increased edema, impaired flexibility, and pain.   ACTIVITY LIMITATIONS: lifting, standing, squatting, stairs, transfers, and locomotion level  PARTICIPATION LIMITATIONS: driving, community activity, yard work, and aeronautical engineer  PERSONAL FACTORS: 3 comorbidities (CVD/CAD, anxiety, dyslipidemia) are also affecting patient's functional outcome.   REHAB POTENTIAL: Good  CLINICAL DECISION MAKING: Evolving/moderate complexity  EVALUATION COMPLEXITY: Moderate   GOALS: Goals reviewed with patient? Yes  SHORT TERM GOALS: Target date: 02/01/2024  Pt will be independent with HEP to improve strength and decrease ankle pain to improve pain-free function at home and work. Baseline: 01/11/24: Baseline home exercises reviewed.      02/26/24: Pt recounts exercise well and is maintaining HEP.  Goal status: ACHIEVED  Pt will attain dorsiflexion to 10 degrees or greater by 8 weeks as needed for normalized gait pattern Baseline: 01/11/24: -15 deg DF at baseline/eval.     02/26/24: DF > 10 deg Goal status: ACHIEVED   LONG TERM GOALS: Target date: 05/13/2024  Pt will ambulate with normalized gait pattern for 660 feet or greater without reproduction of ankle pain and without AD or boot indicative of ability to perform normal community-level ambulation Baseline: 01/11/24: Ambulating home-level distance with  bilateral crutches and CAM boot, partial weightbearing 25 lbs.     02/26/24: Pt is able to ambulate at community-level distance with moderate gait deviation/antalgic pattern during last 1-2 laps.  Goal status: IN PROGRESS/ON-GOING  2.  Pt will negotiate full flight of steps with reciprocal pattern and no major deviations or pain as needed for negotiating 2-story home with upstairs primary bedroom.  Baseline: 01/11/24: Pt has to scoot on stairs and use knee scooter at top/bottom of steps.     02/26/24: Pt able to complete reciprocal stair climb/descent safety.  Goal status: ACHIEVED   3.  Pt will increase LEFS score to 60/80 or greater in order demonstrate clinically significant improvement in ankle pain/level of function.       Baseline: 01/11/24: 21/80 = 26.3%    02/26/24: 43/80 = 53.8% Goal status: IN PROGRESS  4.  Pt will increase strength of tested ankle musculature to 4+/5 or greater MMT grade in order to demonstrate improvement in strength and function  Baseline: 01/11/24: 2 to 3+ ankle strength per MMT (see chart above).        02/26/24: Met for dorsiflexion and eversion, not met for plantarflexors and inversion.  Goal status: IN PROGRESS   PLAN: PT FREQUENCY: 1-2x/week  PT DURATION: 6-8 weeks  PLANNED INTERVENTIONS: Therapeutic exercises, Therapeutic activity, Neuromuscular re-education, Balance training, Gait training, Patient/Family education, Self Care, Joint mobilization, Joint manipulation, Vestibular training, Canalith repositioning, Orthotic/Fit training, DME instructions, Dry Needling, Electrical stimulation, Spinal manipulation, Spinal mobilization, Cryotherapy, Moist heat, Taping, Traction, Ultrasound, Ionotophoresis 4mg /ml Dexamethasone, Manual therapy, and Re-evaluation.  PLAN FOR NEXT SESSION: Progressive CKC loading, double-limb calf raise progression, balance and proprioceptive drills. Progress as tolerated per protocol. Working toward single-leg heel raise progression at 4-6  months.    Venetia Endo, PT, DPT #[id number]  Venetia ONEIDA Endo, PT 04/09/2024, 8:54 AM

## 2024-04-15 ENCOUNTER — Ambulatory Visit: Admitting: Physical Therapy

## 2024-04-15 DIAGNOSIS — R262 Difficulty in walking, not elsewhere classified: Secondary | ICD-10-CM

## 2024-04-15 DIAGNOSIS — M25572 Pain in left ankle and joints of left foot: Secondary | ICD-10-CM

## 2024-04-15 DIAGNOSIS — M6281 Muscle weakness (generalized): Secondary | ICD-10-CM

## 2024-04-15 NOTE — Therapy (Signed)
 OUTPATIENT PHYSICAL THERAPY ANKLE POST-OP TREATMENT  Patient Name: Jake Wagner MRN: [id number] DOB:June 20, 1960, 63 y.o., male Today's Date: 04/15/2024  END OF SESSION:  PT End of Session - 04/15/24 0729     Visit Number 18    Number of Visits 21    PT Start Time 0730    PT Stop Time 0809    PT Time Calculation (min) 39 min    Equipment Utilized During Treatment --    Activity Tolerance Patient tolerated treatment well    Behavior During Therapy WFL for tasks assessed/performed           Past Medical History:  Diagnosis Date   Anginal pain    History of chicken pox    Hypercholesteremia    Past Surgical History:  Procedure Laterality Date   CARDIAC CATHETERIZATION Left 01/07/2016   Procedure: Left Heart Cath and Coronary Angiography;  Surgeon: Cara JONETTA Lovelace, MD;  Location: [facility];  Service: Cardiovascular;  Laterality: Left;   Patient Active Problem List   Diagnosis Date Noted   Achilles tendon tear 12/18/2023   Right knee pain 04/14/2023   History of pneumonia 02/01/2023   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 04/29/2022   History of COVID-19 08/01/2021   Left shoulder pain 07/27/2021   Respiratory illness 07/12/2021   Acute cough 05/20/2021   Erectile dysfunction 01/30/2021   Right ankle pain 03/12/2020   Stress 10/21/2018   Rash 10/21/2018   Blood in semen 10/21/2018   Memory change 01/14/2018   Hemorrhoid 01/14/2018   History of malignant melanoma of skin 06/15/2017   History of melanoma in situ 05/30/2017   Aortic aneurysm 10/22/2016   Chronic right shoulder pain 05/18/2016   Diastolic dysfunction 04/27/2016   Dilated aortic root 04/27/2016   Adhesive capsulitis of right shoulder 03/16/2016   Light headedness 02/21/2016   Right shoulder pain 02/18/2016   Fatigue 10/14/2015   CAD (coronary artery disease) 10/14/2015   History of colonic polyps 08/19/2015   Hypercholesterolemia 06/22/2015   History of knee surgery 06/22/2015    Health care maintenance 06/22/2015    PCP: Allena Hamilton, MD  REFERRING PROVIDER: Kaitlin M Picone, PA-C  REFERRING DIAG: [phone] (ICD-10-CM) - Strain of left Achilles tendon, initial encounter   Rationale for Evaluation and Treatment: Habilitation  THERAPY DIAG: Pain in left ankle and joints of left foot  Difficulty in walking, not elsewhere classified  Muscle weakness (generalized)  ONSET DATE: L Achilles tendon repair (DOS 12/13/23)   FOLLOW-UP APPT SCHEDULED WITH REFERRING PROVIDER: Yes ; in 3 weeks from initial eval, next f/u with surgeon's office  PERTINENT HISTORY: Pt is a 63 year old male s/p L Achilles tendon repair (DOS: 12/13/23). Patient reports no post-op complications. Patient reports some tenderness along Achilles tendon region. Pt has upstairs bedroom and scoots on bottom to negotiate steps.   PAIN:    Pain Intensity: Present: 0/10, Worst: 0/10 Pain location: Achilles tendon  Radiating: No  Swelling: Yes ; minor swelling on ankle Numbness/Tingling: Yes; seldom tingling in foot  History of prior back, hip, knee, or ankle injury, pain, surgery, or therapy: Yes; L ankle dislocation in 8th grade, no other significant Hx  Imaging: No  Typical footwear:  Red flags: Negative for personal history of cancer, chills/fever, night sweats, nausea, vomiting, unrelenting pain): Negative  PRECAUTIONS: Other: Weightbearing restriction and no DF beyond neutral   WEIGHT BEARING RESTRICTIONS: Yes 50 lbs PWB, increase 25 lbs each successive week starting 01/11/24  FALLS: Has patient fallen  in last 6 months? No  Living Environment Lives with: lives with their spouse and brother-in-law Lives in: House/apartment Stairs: Yes: Internal: 16 steps; on left going up and External: 3 steps; on right going up (going into garage) Has following equipment at home: Crutches, knee scooter   Prior level of function: Independent  Occupational demands: Photography business  Hobbies: Working  on yard, psychologist, clinical  Patient Goals: Return to baseline    OBJECTIVE (data from initial evaluation unless otherwise dated):   Patient Surveys  LEFS: 21/80 = 26.3%   Gross Musculoskeletal Assessment Bulk: Normal Tone: Normal Moderate ankle edema, mild erythema along incision and ankle. No sign of acute infection or delayed healing.   GAIT: Distance walked: 30 ft Assistive device utilized: Bilateral axillary crutches Level of assistance: SBA Comments: PWB LLE in bilateral axillary crutches in CAM boot only, proper use of upper limb support to offload surgical LE   AROM AROM (Normal range in degrees) AROM  01/11/24 AROM 02/26/24   Right Left Left  Knee     Flexion (135) WNL WNL   Extension (0) WNL WNL        Ankle     Dorsiflexion (20)  -15 12 (18 passively)  Plantarflexion (50)  40 WNL  Inversion (35)  20 35  Eversion (15)  WNL WNL  (* = pain; Blank rows = not tested)   LE MMT: MMT (out of 5) Right 01/11/24 Left 01/11/24 Right 02/26/24 Left 02/26/24  Hip flexion 4- 4    Hip extension      Hip abduction 4+ 4+    Hip adduction      Hip internal rotation      Hip external rotation      Knee flexion 5 5    Knee extension 5 5    Ankle dorsiflexion 5 3+  5  Ankle plantarflexion 5 2  3+ (no single- limb heel raise yet)  Ankle inversion 5 3+  4  Ankle eversion 5 3+  4+  (* = pain; Blank rows = not tested)  Sensation Grossly intact to light touch throughout bilateral LEs as determined by testing dermatomes L2-S2. Proprioception, stereognosis, and hot/cold testing deferred on this date.  Reflexes Deferred  Palpation Mid-substance Achilles 1, gastroc-Achilles musculotendinous junction 1, insertional Achilles 0, calcaneal tuberosity 0 Graded on 0-4 scale (0 = no pain, 1 = pain, 2 = pain with wincing/grimacing/flinching, 3 = pain with withdrawal, 4 = unwilling to allow palpation), (Blank rows = not tested)  Passive Accessory Motion Deferred  VASCULAR Dorsalis pedis  and posterior tibial pulses are palpable Capillary refill WNL      TODAY'S TREATMENT: DATE: 04/15/2024  SUBJECTIVE STATEMENT:   Pt reports no notable pain at arrival. He has some heel soreness with prolonged weightbearing/standing. Patient reports no other major update today. He denies notable soreness after last visit.      Therapeutic Exercise - for improved soft tissue flexibility and extensibility as needed for ROM, improved strength as needed to improve performance of CKC  activities/functional movements    SciFit seated elliptical; Level 8.5, x 5 minutes - for improved soft tissue mobility and increased tissue temperature to improve muscle performance    Single-limb wobble board (PF/DF); x20 alternating back/forth intermittent UE support on adjacent railing   Total Gym; 2 up, 1 down; single limb eccentric; 2 x 10 Level 22, 1 x 10 Level 20   Standing heel raise; 2 x 15, slow eccentric phase (3-5 sec), with 9-lb Dbells in  bilat hands   Single limb stance on Airex, with Med ball toss to rebounder; 3 x 15 with 6.6-lb ball    *not today* Seated heel raise; 2 x 15, 20-lb cuff weight (10-lb + 10-lb) resting on patient's knee Tandem stance on Airex; x 30 sec each position Tandem walk on Airex; 4x D/B, forward stepping only  Total Gym; Level 22; double-limb heel raise; 3 x 10  Seated towel scrunch; 2 x 1 min, with added 1-lb Dbell on towel Reclined ankle Tband; PF x 20 ea dir with Black Tband  Reclined ankle Tband; inversion and eversion x 10 ea dir with Black Tband   -for HEP review  -PT holding band for INV   Ankle pump; PF/DF AROM as tolerated; x20 into PF and DF Seated ankle inversion/eversion with towel; x 20 Active straight leg raise; 2 x 10    Therapeutic Activity - to improve capacity for gait with less reliance on AD, pre-gait activities to improve gait stability and adequate weight shift     TRX single-leg skater squats; 2 x 10     Lateral stepdown; 6-inch step,  staircase center of gym; 2 x 12  Minisquat, with 10-lb Goblet hold; 2 x 15, with mirror feedback for technique  BOSU squat, on flat side; 2 x 10   PATIENT EDUCATION: HEP updated and reviewed. Discussed prognosis/expected progression of PT with subsequent weeks.   *not today* Forward step-up to hurdle step; 12-inch step + Airex, staircase in center of gym; 2 x 12 Total Gym single-limb squat; 3 x 15, Level 22 Forward to retro step along blue agility ladder; 5x D/B Toe tapping on 6-inch step to simulate side-to-side weight shifting with ambulation; 2 x 10, alt Forward gait in hallway, focus on heel to toe progression and symmetrical weight shift; x3 D/B 70-ft hallway   PATIENT EDUCATION:  Education details: see above for patient education details Person educated: Patient Education method: Explanation, Demonstration, and Handouts Education comprehension: verbalized understanding and returned demonstration   HOME EXERCISE PROGRAM:  Access Code: [email or web address] URL: [email or web address] Date: 03/18/2024 Prepared by: Venetia Endo  Exercises - Long Sitting Ankle Plantar Flexion with Resistance  - 1 x daily - 7 x weekly - 2 sets - 10 reps - Ankle Inversion with Resistance  - 1 x daily - 7 x weekly - 2 sets - 10 reps - Seated Heel Raise  - 1-2 x daily - 7 x weekly - 2-3 sets - 10 reps - Standing Heel Raise  - 1-2 x daily - 7 x weekly - 2-3 sets - 15 reps - Single Leg Stance on Foam Pad  - 4-5 x weekly - 3 sets - 30sec hold - Goblet Squat with Kettlebell  - 1 x daily - 4-5 x weekly - 2 sets - 12-15 reps - Lunge with Counter Support  - 1 x daily - 4-5 x weekly - 2 sets - 12-15 reps - Runner's Step Up/Down  - 1 x daily - 4-5 x weekly - 2 sets - 12-15 reps   ASSESSMENT:  CLINICAL IMPRESSION: Patient is doing very well with progression of CKC strengthening and compound exercise. Patient is still limited with single-limb heel raise and calf power. We further progressed with  eccentrics with partial bodyweight. Pt exhibits excellent postural control with minimal LOB with single-limb stance stability drills.  Patient has expected post-operative deficits in ankle/plantarflexor strength, post-op edema, gait stability/changes. Pt will continue to benefit from skilled PT services to address deficits and improve function.  OBJECTIVE IMPAIRMENTS: Abnormal gait, decreased activity tolerance, decreased balance, decreased mobility, difficulty walking, decreased ROM, decreased strength, hypomobility, increased edema, impaired flexibility, and pain.   ACTIVITY LIMITATIONS: lifting, standing, squatting, stairs, transfers, and locomotion level  PARTICIPATION LIMITATIONS: driving, community activity, yard work, and aeronautical engineer  PERSONAL FACTORS: 3 comorbidities (CVD/CAD, anxiety, dyslipidemia) are also affecting patient's functional outcome.   REHAB POTENTIAL: Good  CLINICAL DECISION MAKING: Evolving/moderate complexity  EVALUATION COMPLEXITY: Moderate   GOALS: Goals reviewed with patient? Yes  SHORT TERM GOALS: Target date: 02/01/2024  Pt will be independent with HEP to improve strength and decrease ankle pain to improve pain-free function at home and work. Baseline: 01/11/24: Baseline home exercises reviewed.      02/26/24: Pt recounts exercise well and is maintaining HEP.  Goal status: ACHIEVED  Pt will attain dorsiflexion to 10 degrees or greater by 8 weeks as needed for normalized gait pattern Baseline: 01/11/24: -15 deg DF at baseline/eval.     02/26/24: DF > 10 deg Goal status: ACHIEVED   LONG TERM GOALS: Target date: 05/13/2024  Pt will ambulate with normalized gait pattern for 660 feet or greater without reproduction of ankle pain and without AD or boot indicative of ability to perform normal community-level ambulation Baseline: 01/11/24: Ambulating home-level distance with bilateral crutches and CAM boot, partial weightbearing 25 lbs.     02/26/24: Pt is able to  ambulate at community-level distance with moderate gait deviation/antalgic pattern during last 1-2 laps.  Goal status: IN PROGRESS/ON-GOING  2.  Pt will negotiate full flight of steps with reciprocal pattern and no major deviations or pain as needed for negotiating 2-story home with upstairs primary bedroom.  Baseline: 01/11/24: Pt has to scoot on stairs and use knee scooter at top/bottom of steps.     02/26/24: Pt able to complete reciprocal stair climb/descent safety.  Goal status: ACHIEVED   3.  Pt will increase LEFS score to 60/80 or greater in order demonstrate clinically significant improvement in ankle pain/level of function.       Baseline: 01/11/24: 21/80 = 26.3%    02/26/24: 43/80 = 53.8% Goal status: IN PROGRESS  4.  Pt will increase strength of tested ankle musculature to 4+/5 or greater MMT grade in order to demonstrate improvement in strength and function  Baseline: 01/11/24: 2 to 3+ ankle strength per MMT (see chart above).        02/26/24: Met for dorsiflexion and eversion, not met for plantarflexors and inversion.  Goal status: IN PROGRESS   PLAN: PT FREQUENCY: 1-2x/week  PT DURATION: 6-8 weeks  PLANNED INTERVENTIONS: Therapeutic exercises, Therapeutic activity, Neuromuscular re-education, Balance training, Gait training, Patient/Family education, Self Care, Joint mobilization, Joint manipulation, Vestibular training, Canalith repositioning, Orthotic/Fit training, DME instructions, Dry Needling, Electrical stimulation, Spinal manipulation, Spinal mobilization, Cryotherapy, Moist heat, Taping, Traction, Ultrasound, Ionotophoresis 4mg /ml Dexamethasone, Manual therapy, and Re-evaluation.  PLAN FOR NEXT SESSION: Progressive CKC loading, double-limb calf raise progression, balance and proprioceptive drills. Progress as tolerated per protocol. Working toward single-leg heel raise progression at 4-6 months.    Venetia Endo, PT, DPT #[id number]  Venetia ONEIDA Endo, PT 04/15/2024, 8:14  AM

## 2024-04-16 ENCOUNTER — Ambulatory Visit: Admitting: Physical Therapy

## 2024-04-23 ENCOUNTER — Ambulatory Visit: Admitting: Physical Therapy

## 2024-04-23 ENCOUNTER — Encounter: Payer: Self-pay | Admitting: Physical Therapy

## 2024-04-23 DIAGNOSIS — R262 Difficulty in walking, not elsewhere classified: Secondary | ICD-10-CM

## 2024-04-23 DIAGNOSIS — M25572 Pain in left ankle and joints of left foot: Secondary | ICD-10-CM

## 2024-04-23 DIAGNOSIS — M6281 Muscle weakness (generalized): Secondary | ICD-10-CM

## 2024-04-23 NOTE — Therapy (Signed)
 OUTPATIENT PHYSICAL THERAPY ANKLE POST-OP TREATMENT  Patient Name: Jake Wagner MRN: [id number] DOB:Oct 19, 1960, 63 y.o., male Today's Date: 04/23/2024  END OF SESSION:  PT End of Session - 04/23/24 1032     Visit Number 19    Number of Visits 21    PT Start Time 1030    PT Stop Time 1109    PT Time Calculation (min) 39 min    Activity Tolerance Patient tolerated treatment well    Behavior During Therapy WFL for tasks assessed/performed          Past Medical History:  Diagnosis Date   Anginal pain    History of chicken pox    Hypercholesteremia    Past Surgical History:  Procedure Laterality Date   CARDIAC CATHETERIZATION Left 01/07/2016   Procedure: Left Heart Cath and Coronary Angiography;  Surgeon: Cara JONETTA Lovelace, MD;  Location: [facility];  Service: Cardiovascular;  Laterality: Left;   Patient Active Problem List   Diagnosis Date Noted   Achilles tendon tear 12/18/2023   Right knee pain 04/14/2023   History of pneumonia 02/01/2023   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 04/29/2022   History of COVID-19 08/01/2021   Left shoulder pain 07/27/2021   Respiratory illness 07/12/2021   Acute cough 05/20/2021   Erectile dysfunction 01/30/2021   Right ankle pain 03/12/2020   Stress 10/21/2018   Rash 10/21/2018   Blood in semen 10/21/2018   Memory change 01/14/2018   Hemorrhoid 01/14/2018   History of malignant melanoma of skin 06/15/2017   History of melanoma in situ 05/30/2017   Aortic aneurysm 10/22/2016   Chronic right shoulder pain 05/18/2016   Diastolic dysfunction 04/27/2016   Dilated aortic root 04/27/2016   Adhesive capsulitis of right shoulder 03/16/2016   Light headedness 02/21/2016   Right shoulder pain 02/18/2016   Fatigue 10/14/2015   CAD (coronary artery disease) 10/14/2015   History of colonic polyps 08/19/2015   Hypercholesterolemia 06/22/2015   History of knee surgery 06/22/2015   Health care maintenance 06/22/2015    PCP:  Allena Hamilton, MD  REFERRING PROVIDER: Kaitlin M Picone, PA-C  REFERRING DIAG: [phone] (ICD-10-CM) - Strain of left Achilles tendon, initial encounter   Rationale for Evaluation and Treatment: Habilitation  THERAPY DIAG: Pain in left ankle and joints of left foot  Difficulty in walking, not elsewhere classified  Muscle weakness (generalized)  ONSET DATE: L Achilles tendon repair (DOS 12/13/23)   FOLLOW-UP APPT SCHEDULED WITH REFERRING PROVIDER: Yes ; in 3 weeks from initial eval, next f/u with surgeon's office  PERTINENT HISTORY: Pt is a 63 year old male s/p L Achilles tendon repair (DOS: 12/13/23). Patient reports no post-op complications. Patient reports some tenderness along Achilles tendon region. Pt has upstairs bedroom and scoots on bottom to negotiate steps.   PAIN:    Pain Intensity: Present: 0/10, Worst: 0/10 Pain location: Achilles tendon  Radiating: No  Swelling: Yes ; minor swelling on ankle Numbness/Tingling: Yes; seldom tingling in foot  History of prior back, hip, knee, or ankle injury, pain, surgery, or therapy: Yes; L ankle dislocation in 8th grade, no other significant Hx  Imaging: No  Typical footwear:  Red flags: Negative for personal history of cancer, chills/fever, night sweats, nausea, vomiting, unrelenting pain): Negative  PRECAUTIONS: Other: Weightbearing restriction and no DF beyond neutral   WEIGHT BEARING RESTRICTIONS: Yes 50 lbs PWB, increase 25 lbs each successive week starting 01/11/24  FALLS: Has patient fallen in last 6 months? No  Living Environment Lives  with: lives with their spouse and brother-in-law Lives in: House/apartment Stairs: Yes: Internal: 16 steps; on left going up and External: 3 steps; on right going up (going into garage) Has following equipment at home: Crutches, knee scooter   Prior level of function: Independent  Occupational demands: Photography business  Hobbies: Working on yard, psychologist, clinical  Patient Goals: Return to  baseline    OBJECTIVE (data from initial evaluation unless otherwise dated):   Patient Surveys  LEFS: 21/80 = 26.3%   Gross Musculoskeletal Assessment Bulk: Normal Tone: Normal Moderate ankle edema, mild erythema along incision and ankle. No sign of acute infection or delayed healing.   GAIT: Distance walked: 30 ft Assistive device utilized: Bilateral axillary crutches Level of assistance: SBA Comments: PWB LLE in bilateral axillary crutches in CAM boot only, proper use of upper limb support to offload surgical LE   AROM AROM (Normal range in degrees) AROM  01/11/24 AROM 02/26/24   Right Left Left  Knee     Flexion (135) WNL WNL   Extension (0) WNL WNL        Ankle     Dorsiflexion (20)  -15 12 (18 passively)  Plantarflexion (50)  40 WNL  Inversion (35)  20 35  Eversion (15)  WNL WNL  (* = pain; Blank rows = not tested)   LE MMT: MMT (out of 5) Right 01/11/24 Left 01/11/24 Right 02/26/24 Left 02/26/24  Hip flexion 4- 4    Hip extension      Hip abduction 4+ 4+    Hip adduction      Hip internal rotation      Hip external rotation      Knee flexion 5 5    Knee extension 5 5    Ankle dorsiflexion 5 3+  5  Ankle plantarflexion 5 2  3+ (no single- limb heel raise yet)  Ankle inversion 5 3+  4  Ankle eversion 5 3+  4+  (* = pain; Blank rows = not tested)  Sensation Grossly intact to light touch throughout bilateral LEs as determined by testing dermatomes L2-S2. Proprioception, stereognosis, and hot/cold testing deferred on this date.  Reflexes Deferred  Palpation Mid-substance Achilles 1, gastroc-Achilles musculotendinous junction 1, insertional Achilles 0, calcaneal tuberosity 0 Graded on 0-4 scale (0 = no pain, 1 = pain, 2 = pain with wincing/grimacing/flinching, 3 = pain with withdrawal, 4 = unwilling to allow palpation), (Blank rows = not tested)  Passive Accessory Motion Deferred  VASCULAR Dorsalis pedis and posterior tibial pulses are  palpable Capillary refill WNL      TODAY'S TREATMENT: DATE: 04/23/2024  SUBJECTIVE STATEMENT:   Pt reports some intermittent usual soreness with prolonged weightbearing/standing activity. Pt reports no pain at arrival. Patient reports doing okay with recent exercise progressions.   Therapeutic Exercise - for improved soft tissue flexibility and extensibility as needed for ROM, improved strength as needed to improve performance of CKC  activities/functional movements    SciFit seated elliptical; Level 7.5, x 5 minutes - for improved soft tissue mobility and increased tissue temperature to improve muscle performance    Total Gym, single-limb heel raise; Level 16; 2 x 10  Total Gym, single-limb eccentrics, 2-up 1-down Level 20;  2 x 10   Single limb stance on Airex, with Med ball toss to rebounder; 3 x 15 with 6.6-lb ball    Single-limb RDL; 2 x 10  -demo and cueing for technique, mirror feedback for form   *not today*  Standing heel raise;  2 x 15, slow eccentric phase (3-5 sec), with 9-lb Dbells in bilat hands  Single-limb wobble board (PF/DF); x20 alternating back/forth intermittent UE support on adjacent railing Seated heel raise; 2 x 15, 20-lb cuff weight (10-lb + 10-lb) resting on patient's knee Tandem stance on Airex; x 30 sec each position Tandem walk on Airex; 4x D/B, forward stepping only  Total Gym; Level 22; double-limb heel raise; 3 x 10  Seated towel scrunch; 2 x 1 min, with added 1-lb Dbell on towel Reclined ankle Tband; PF x 20 ea dir with Black Tband  Reclined ankle Tband; inversion and eversion x 10 ea dir with Black Tband   -for HEP review  -PT holding band for INV   Ankle pump; PF/DF AROM as tolerated; x20 into PF and DF Seated ankle inversion/eversion with towel; x 20 Active straight leg raise; 2 x 10    Therapeutic Activity - to improve capacity for gait with less reliance on AD, pre-gait activities to improve gait stability and adequate weight shift      TRX single-leg skater squats; 2 x 12     Lateral stepdown; 6-inch step, staircase center of gym; 2 x 12  BOSU squat, on flat side; 2 x 12   PATIENT EDUCATION: HEP reviewed. Discussed modified single-limb heel raise in bilat upper limb supported position.    *not today* Minisquat, with 10-lb Goblet hold; 2 x 15, with mirror feedback for technique Forward step-up to hurdle step; 12-inch step + Airex, staircase in center of gym; 2 x 12 Total Gym single-limb squat; 3 x 15, Level 22 Forward to retro step along blue agility ladder; 5x D/B Toe tapping on 6-inch step to simulate side-to-side weight shifting with ambulation; 2 x 10, alt Forward gait in hallway, focus on heel to toe progression and symmetrical weight shift; x3 D/B 70-ft hallway   PATIENT EDUCATION:  Education details: see above for patient education details Person educated: Patient Education method: Explanation, Demonstration, and Handouts Education comprehension: verbalized understanding and returned demonstration   HOME EXERCISE PROGRAM:  Access Code: [email or web address] URL: [email or web address] Date: 03/18/2024 Prepared by: Venetia Endo  Exercises - Long Sitting Ankle Plantar Flexion with Resistance  - 1 x daily - 7 x weekly - 2 sets - 10 reps - Ankle Inversion with Resistance  - 1 x daily - 7 x weekly - 2 sets - 10 reps - Seated Heel Raise  - 1-2 x daily - 7 x weekly - 2-3 sets - 10 reps - Standing Heel Raise  - 1-2 x daily - 7 x weekly - 2-3 sets - 15 reps - Single Leg Stance on Foam Pad  - 4-5 x weekly - 3 sets - 30sec hold - Goblet Squat with Kettlebell  - 1 x daily - 4-5 x weekly - 2 sets - 12-15 reps - Lunge with Counter Support  - 1 x daily - 4-5 x weekly - 2 sets - 12-15 reps - Runner's Step Up/Down  - 1 x daily - 4-5 x weekly - 2 sets - 12-15 reps   ASSESSMENT:  CLINICAL IMPRESSION: Patient is able to progress into modified single-limb heel raises in modified positions (e.g. reclined on Total  Gym for partial body weight and use of bilat support on railing to decreased LE load) and he is able to continue with advanced CKC drills and proprioceptive work. Pt is making excellent progress to date, but he needs further work on plantarflexor power. Patient has expected post-operative deficits in ankle/plantarflexor strength, post-op  edema, gait stability/changes. Pt will continue to benefit from skilled PT services to address deficits and improve function.   OBJECTIVE IMPAIRMENTS: Abnormal gait, decreased activity tolerance, decreased balance, decreased mobility, difficulty walking, decreased ROM, decreased strength, hypomobility, increased edema, impaired flexibility, and pain.   ACTIVITY LIMITATIONS: lifting, standing, squatting, stairs, transfers, and locomotion level  PARTICIPATION LIMITATIONS: driving, community activity, yard work, and aeronautical engineer  PERSONAL FACTORS: 3 comorbidities (CVD/CAD, anxiety, dyslipidemia) are also affecting patient's functional outcome.   REHAB POTENTIAL: Good  CLINICAL DECISION MAKING: Evolving/moderate complexity  EVALUATION COMPLEXITY: Moderate   GOALS: Goals reviewed with patient? Yes  SHORT TERM GOALS: Target date: 02/01/2024  Pt will be independent with HEP to improve strength and decrease ankle pain to improve pain-free function at home and work. Baseline: 01/11/24: Baseline home exercises reviewed.      02/26/24: Pt recounts exercise well and is maintaining HEP.  Goal status: ACHIEVED  Pt will attain dorsiflexion to 10 degrees or greater by 8 weeks as needed for normalized gait pattern Baseline: 01/11/24: -15 deg DF at baseline/eval.     02/26/24: DF > 10 deg Goal status: ACHIEVED   LONG TERM GOALS: Target date: 05/13/2024  Pt will ambulate with normalized gait pattern for 660 feet or greater without reproduction of ankle pain and without AD or boot indicative of ability to perform normal community-level ambulation Baseline: 01/11/24:  Ambulating home-level distance with bilateral crutches and CAM boot, partial weightbearing 25 lbs.     02/26/24: Pt is able to ambulate at community-level distance with moderate gait deviation/antalgic pattern during last 1-2 laps.  Goal status: IN PROGRESS/ON-GOING  2.  Pt will negotiate full flight of steps with reciprocal pattern and no major deviations or pain as needed for negotiating 2-story home with upstairs primary bedroom.  Baseline: 01/11/24: Pt has to scoot on stairs and use knee scooter at top/bottom of steps.     02/26/24: Pt able to complete reciprocal stair climb/descent safety.  Goal status: ACHIEVED   3.  Pt will increase LEFS score to 60/80 or greater in order demonstrate clinically significant improvement in ankle pain/level of function.       Baseline: 01/11/24: 21/80 = 26.3%    02/26/24: 43/80 = 53.8% Goal status: IN PROGRESS  4.  Pt will increase strength of tested ankle musculature to 4+/5 or greater MMT grade in order to demonstrate improvement in strength and function  Baseline: 01/11/24: 2 to 3+ ankle strength per MMT (see chart above).        02/26/24: Met for dorsiflexion and eversion, not met for plantarflexors and inversion.  Goal status: IN PROGRESS   PLAN: PT FREQUENCY: 1-2x/week  PT DURATION: 6-8 weeks  PLANNED INTERVENTIONS: Therapeutic exercises, Therapeutic activity, Neuromuscular re-education, Balance training, Gait training, Patient/Family education, Self Care, Joint mobilization, Joint manipulation, Vestibular training, Canalith repositioning, Orthotic/Fit training, DME instructions, Dry Needling, Electrical stimulation, Spinal manipulation, Spinal mobilization, Cryotherapy, Moist heat, Taping, Traction, Ultrasound, Ionotophoresis 4mg /ml Dexamethasone, Manual therapy, and Re-evaluation.  PLAN FOR NEXT SESSION: Progressive CKC loading, double-limb calf raise progression, balance and proprioceptive drills. Progress as tolerated per protocol. Working toward  single-leg heel raise progression at 4-6 months.    Venetia Endo, PT, DPT #[id number]  Venetia ONEIDA Endo, PT 04/23/2024, 10:33 AM

## 2024-05-06 NOTE — Addendum Note (Signed)
 Addended by: ANDRA DITCH T on: 05/06/2024 09:05 PM   Modules accepted: Orders

## 2024-05-08 ENCOUNTER — Encounter: Payer: Self-pay | Admitting: Physical Therapy

## 2024-05-08 ENCOUNTER — Ambulatory Visit: Admitting: Physical Therapy

## 2024-05-08 DIAGNOSIS — R262 Difficulty in walking, not elsewhere classified: Secondary | ICD-10-CM

## 2024-05-08 DIAGNOSIS — M25572 Pain in left ankle and joints of left foot: Secondary | ICD-10-CM

## 2024-05-08 DIAGNOSIS — M6281 Muscle weakness (generalized): Secondary | ICD-10-CM

## 2024-05-08 NOTE — Therapy (Signed)
 OUTPATIENT PHYSICAL THERAPY ANKLE POST-OP TREATMENT &  Physical Therapy Progress Note   Dates of reporting period  02/26/24   to   05/08/24   Patient Name: Jake Wagner MRN: [id number] DOB:[birth year], 63 y.o., male Today's Date: 05/08/2024  END OF SESSION:  PT End of Session - 05/08/24 0943     Visit Number 20    Number of Visits 21    PT Start Time 0945    PT Stop Time 1026    PT Time Calculation (min) 41 min    Activity Tolerance Patient tolerated treatment well    Behavior During Therapy WFL for tasks assessed/performed           Past Medical History:  Diagnosis Date   Anginal pain    History of chicken pox    Hypercholesteremia    Past Surgical History:  Procedure Laterality Date   CARDIAC CATHETERIZATION Left 01/07/2016   Procedure: Left Heart Cath and Coronary Angiography;  Surgeon: Cara JONETTA Lovelace, MD;  Location: [facility];  Service: Cardiovascular;  Laterality: Left;   Patient Active Problem List   Diagnosis Date Noted   Achilles tendon tear 12/18/2023   Right knee pain 04/14/2023   History of pneumonia 02/01/2023   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 04/29/2022   History of COVID-19 08/01/2021   Left shoulder pain 07/27/2021   Respiratory illness 07/12/2021   Acute cough 05/20/2021   Erectile dysfunction 01/30/2021   Right ankle pain 03/12/2020   Stress 10/21/2018   Rash 10/21/2018   Blood in semen 10/21/2018   Memory change 01/14/2018   Hemorrhoid 01/14/2018   History of malignant melanoma of skin 06/15/2017   History of melanoma in situ 05/30/2017   Aortic aneurysm 10/22/2016   Chronic right shoulder pain 05/18/2016   Diastolic dysfunction 04/27/2016   Dilated aortic root 04/27/2016   Adhesive capsulitis of right shoulder 03/16/2016   Light headedness 02/21/2016   Right shoulder pain 02/18/2016   Fatigue 10/14/2015   CAD (coronary artery disease) 10/14/2015   History of colonic polyps 08/19/2015   Hypercholesterolemia  06/22/2015   History of knee surgery 06/22/2015   Health care maintenance 06/22/2015    PCP: Allena Hamilton, MD  REFERRING PROVIDER: Kaitlin M Picone, PA-C  REFERRING DIAG: [id number] (ICD-10-CM) - Strain of left Achilles tendon, initial encounter   Rationale for Evaluation and Treatment: Habilitation  THERAPY DIAG: Pain in left ankle and joints of left foot  Difficulty in walking, not elsewhere classified  Muscle weakness (generalized)  ONSET DATE: L Achilles tendon repair (DOS 12/13/23)   FOLLOW-UP APPT SCHEDULED WITH REFERRING PROVIDER: Yes ; in 3 weeks from initial eval, next f/u with surgeon's office  PERTINENT HISTORY: Pt is a 63 year old male s/p L Achilles tendon repair (DOS: 12/13/23). Patient reports no post-op complications. Patient reports some tenderness along Achilles tendon region. Pt has upstairs bedroom and scoots on bottom to negotiate steps.   PAIN:    Pain Intensity: Present: 0/10, Worst: 0/10 Pain location: Achilles tendon  Radiating: No  Swelling: Yes ; minor swelling on ankle Numbness/Tingling: Yes; seldom tingling in foot  History of prior back, hip, knee, or ankle injury, pain, surgery, or therapy: Yes; L ankle dislocation in 8th grade, no other significant Hx  Imaging: No  Typical footwear:  Red flags: Negative for personal history of cancer, chills/fever, night sweats, nausea, vomiting, unrelenting pain): Negative  PRECAUTIONS: Other: Weightbearing restriction and no DF beyond neutral   WEIGHT BEARING RESTRICTIONS: Yes 50 lbs PWB,  increase 25 lbs each successive week starting 01/11/24  FALLS: Has patient fallen in last 6 months? No  Living Environment Lives with: lives with their spouse and brother-in-law Lives in: House/apartment Stairs: Yes: Internal: 16 steps; on left going up and External: 3 steps; on right going up (going into garage) Has following equipment at home: Crutches, knee scooter   Prior level of function:  Independent  Occupational demands: Photography business  Hobbies: Working on yard, psychologist, clinical  Patient Goals: Return to baseline    OBJECTIVE (data from initial evaluation unless otherwise dated):   Patient Surveys  LEFS: 21/80 = 26.3%   Gross Musculoskeletal Assessment Bulk: Normal Tone: Normal Moderate ankle edema, mild erythema along incision and ankle. No sign of acute infection or delayed healing.   GAIT: Distance walked: 30 ft Assistive device utilized: Bilateral axillary crutches Level of assistance: SBA Comments: PWB LLE in bilateral axillary crutches in CAM boot only, proper use of upper limb support to offload surgical LE   AROM AROM (Normal range in degrees) AROM  01/11/24 AROM 02/26/24   Right Left Left  Knee     Flexion (135) WNL WNL   Extension (0) WNL WNL        Ankle     Dorsiflexion (20)  -15 12 (18 passively)  Plantarflexion (50)  40 WNL  Inversion (35)  20 35  Eversion (15)  WNL WNL  (* = pain; Blank rows = not tested)   LE MMT: MMT (out of 5) Right 01/11/24 Left 01/11/24 Right 02/26/24 Left 02/26/24 Left 05/08/24  Hip flexion 4- 4     Hip extension       Hip abduction 4+ 4+     Hip adduction       Hip internal rotation       Hip external rotation       Knee flexion 5 5     Knee extension 5 5     Ankle dorsiflexion 5 3+  5 5  Ankle plantarflexion 5 2  3+ (no single- limb heel raise yet) 3+ (no single- limb heel raise yet)  Ankle inversion 5 3+  4 5  Ankle eversion 5 3+  4+ 4+  (* = pain; Blank rows = not tested)  Sensation Grossly intact to light touch throughout bilateral LEs as determined by testing dermatomes L2-S2. Proprioception, stereognosis, and hot/cold testing deferred on this date.  Reflexes Deferred  Palpation Mid-substance Achilles 1, gastroc-Achilles musculotendinous junction 1, insertional Achilles 0, calcaneal tuberosity 0 Graded on 0-4 scale (0 = no pain, 1 = pain, 2 = pain with wincing/grimacing/flinching, 3 = pain  with withdrawal, 4 = unwilling to allow palpation), (Blank rows = not tested)  Passive Accessory Motion Deferred  VASCULAR Dorsalis pedis and posterior tibial pulses are palpable Capillary refill WNL      TODAY'S TREATMENT: DATE: 05/08/2024   SUBJECTIVE STATEMENT:   Denies pain on arrival. Patient reports continued difficulty with single leg calf raise.   Physical therapy treatment session today consisted of completing assessment of goals and administration of testing as demonstrated and documented in flow sheet, treatment, and goals section of this note. Addition treatments may be found below.    LEFS: 65/80  Ambulation: continues to demonstrate decreased push off with LLE   MMT: met DF, eversion, and inversion; 3+/5 for L plantar flexors (unable to complete single limb heel raise   Therapeutic Exercise - for improved soft tissue flexibility and extensibility as needed for ROM, improved strength as needed  to improve performance of CKC  activities/functional movements    SciFit seated elliptical; Level 7.5, x 5 minutes - for improved soft tissue mobility and increased tissue temperature to improve muscle performance    Total Gym, single-limb heel raise; Level 16;  x 10  Total Gym, single-limb eccentrics, 2-up 1-down Level 20;  2 x 10   Single limb eccentrics on 6 step (2 up, 1 down) 2 x 10 with BUE support     Single-limb RDL; 2 x 10  -demo and cueing for technique, mirror feedback for form   *not today*  Standing heel raise; 2 x 15, slow eccentric phase (3-5 sec), with 9-lb Dbells in bilat hands  Single-limb wobble board (PF/DF); x20 alternating back/forth intermittent UE support on adjacent railing Seated heel raise; 2 x 15, 20-lb cuff weight (10-lb + 10-lb) resting on patient's knee Tandem stance on Airex; x 30 sec each position Tandem walk on Airex; 4x D/B, forward stepping only  Total Gym; Level 22; double-limb heel raise; 3 x 10  Seated towel scrunch; 2 x 1 min,  with added 1-lb Dbell on towel Reclined ankle Tband; PF x 20 ea dir with Black Tband  Reclined ankle Tband; inversion and eversion x 10 ea dir with Black Tband   -for HEP review  -PT holding band for INV   Ankle pump; PF/DF AROM as tolerated; x20 into PF and DF Seated ankle inversion/eversion with towel; x 20 Active straight leg raise; 2 x 10    Therapeutic Activity - to improve capacity for gait with less reliance on AD, pre-gait activities to improve gait stability and adequate weight shift    TRX single-leg skater squats; 3 x 12    Lateral stepdown; 6-inch step, staircase center of gym; 2 x 12  BOSU squat, on flat side; 3 x 12   PATIENT EDUCATION: HEP reviewed. Discussed modified single-limb heel raise in bilat upper limb supported position.    *not today* Minisquat, with 10-lb Goblet hold; 2 x 15, with mirror feedback for technique Forward step-up to hurdle step; 12-inch step + Airex, staircase in center of gym; 2 x 12 Total Gym single-limb squat; 3 x 15, Level 22 Forward to retro step along blue agility ladder; 5x D/B Toe tapping on 6-inch step to simulate side-to-side weight shifting with ambulation; 2 x 10, alt Forward gait in hallway, focus on heel to toe progression and symmetrical weight shift; x3 D/B 70-ft hallway   PATIENT EDUCATION:  Education details: see above for patient education details Person educated: Patient Education method: Explanation, Demonstration, and Handouts Education comprehension: verbalized understanding and returned demonstration   HOME EXERCISE PROGRAM:  Access Code: [email or web address] URL: [email or web address] Date: 03/18/2024 Prepared by: Venetia Endo  Exercises - Long Sitting Ankle Plantar Flexion with Resistance  - 1 x daily - 7 x weekly - 2 sets - 10 reps - Ankle Inversion with Resistance  - 1 x daily - 7 x weekly - 2 sets - 10 reps - Seated Heel Raise  - 1-2 x daily - 7 x weekly - 2-3 sets - 10 reps - Standing Heel Raise   - 1-2 x daily - 7 x weekly - 2-3 sets - 15 reps - Single Leg Stance on Foam Pad  - 4-5 x weekly - 3 sets - 30sec hold - Goblet Squat with Kettlebell  - 1 x daily - 4-5 x weekly - 2 sets - 12-15 reps - Lunge with Counter Support  - 1 x daily - 4-5 x  weekly - 2 sets - 12-15 reps - Runner's Step Up/Down  - 1 x daily - 4-5 x weekly - 2 sets - 12-15 reps   ASSESSMENT:  CLINICAL IMPRESSION:   Pt is making excellent progress to date, but he needs further work on plantarflexor power. He has met his LEFS goal with 65/80 score. Continued focus on plantar flexor strength with eccentrics. Patient has expected post-operative deficits in ankle/plantarflexor strength, post-op edema, gait stability/changes. Pt will continue to benefit from skilled PT services to address deficits and improve function.   OBJECTIVE IMPAIRMENTS: Abnormal gait, decreased activity tolerance, decreased balance, decreased mobility, difficulty walking, decreased ROM, decreased strength, hypomobility, increased edema, impaired flexibility, and pain.   ACTIVITY LIMITATIONS: lifting, standing, squatting, stairs, transfers, and locomotion level  PARTICIPATION LIMITATIONS: driving, community activity, yard work, and aeronautical engineer  PERSONAL FACTORS: 3 comorbidities (CVD/CAD, anxiety, dyslipidemia) are also affecting patient's functional outcome.   REHAB POTENTIAL: Good  CLINICAL DECISION MAKING: Evolving/moderate complexity  EVALUATION COMPLEXITY: Moderate   GOALS: Goals reviewed with patient? Yes  SHORT TERM GOALS: Target date: 02/01/2024  Pt will be independent with HEP to improve strength and decrease ankle pain to improve pain-free function at home and work. Baseline: 01/11/24: Baseline home exercises reviewed.      02/26/24: Pt recounts exercise well and is maintaining HEP.  Goal status: ACHIEVED  Pt will attain dorsiflexion to 10 degrees or greater by 8 weeks as needed for normalized gait pattern Baseline: 01/11/24: -15 deg  DF at baseline/eval.     02/26/24: DF > 10 deg Goal status: ACHIEVED   LONG TERM GOALS: Target date: 05/13/2024  Pt will ambulate with normalized gait pattern for 660 feet or greater without reproduction of ankle pain and without AD or boot indicative of ability to perform normal community-level ambulation Baseline: 01/11/24: Ambulating home-level distance with bilateral crutches and CAM boot, partial weightbearing 25 lbs.     02/26/24: Pt is able to ambulate at community-level distance with moderate gait deviation/antalgic pattern during last 1-2 laps.; 05/08/24: decreased push off on L foot due to weak plantar flexors   Goal status: IN PROGRESS/ON-GOING  2.  Pt will negotiate full flight of steps with reciprocal pattern and no major deviations or pain as needed for negotiating 2-story home with upstairs primary bedroom.  Baseline: 01/11/24: Pt has to scoot on stairs and use knee scooter at top/bottom of steps.     02/26/24: Pt able to complete reciprocal stair climb/descent safety.  Goal status: ACHIEVED   3.  Pt will increase LEFS score to 60/80 or greater in order demonstrate clinically significant improvement in ankle pain/level of function.       Baseline: 01/11/24: 21/80 = 26.3%    02/26/24: 43/80 = 53.8%; 05/08/24: 65/80 = 81.3% Goal status: ACHIEVED  4.  Pt will increase strength of tested ankle musculature to 4+/5 or greater MMT grade in order to demonstrate improvement in strength and function  Baseline: 01/11/24: 2 to 3+ ankle strength per MMT (see chart above).        02/26/24: Met for dorsiflexion and eversion, not met for plantarflexors and inversion.; 05/08/24: Met for DF, eversion, and inversion, continues 3+/5 for plantar flexors (unable to complete single limb heel raise) Goal status: IN PROGRESS   PLAN: PT FREQUENCY: 1-2x/week  PT DURATION: 6-8 weeks  PLANNED INTERVENTIONS: Therapeutic exercises, Therapeutic activity, Neuromuscular re-education, Balance training, Gait training,  Patient/Family education, Self Care, Joint mobilization, Joint manipulation, Vestibular training, Canalith repositioning, Orthotic/Fit training, DME instructions, Dry Needling,  Electrical stimulation, Spinal manipulation, Spinal mobilization, Cryotherapy, Moist heat, Taping, Traction, Ultrasound, Ionotophoresis 4mg /ml Dexamethasone, Manual therapy, and Re-evaluation.  PLAN FOR NEXT SESSION: Progressive CKC loading, double-limb calf raise progression, balance and proprioceptive drills. Progress as tolerated per protocol. Working toward single-leg heel raise progression at 4-6 months.    Maryanne Finder, PT, DPT Physical Therapist - [facility]  Maryanne DELENA Finder, PT 05/08/2024, 9:44 AM

## 2024-05-14 ENCOUNTER — Ambulatory Visit: Admitting: Physical Therapy

## 2024-05-14 ENCOUNTER — Encounter: Payer: Self-pay | Admitting: Physical Therapy

## 2024-05-14 DIAGNOSIS — M25572 Pain in left ankle and joints of left foot: Secondary | ICD-10-CM

## 2024-05-14 DIAGNOSIS — M6281 Muscle weakness (generalized): Secondary | ICD-10-CM

## 2024-05-14 DIAGNOSIS — R262 Difficulty in walking, not elsewhere classified: Secondary | ICD-10-CM

## 2024-05-14 NOTE — Therapy (Signed)
 OUTPATIENT PHYSICAL THERAPY ANKLE POST-OP TREATMENT    Patient Name: Jake Wagner MRN: [id number] DOB:07-Nov-1960, 63 y.o., male Today's Date: 05/14/2024  END OF SESSION:  PT End of Session - 05/14/24 0731     Visit Number 21    Number of Visits 28    Date for Recertification  05/20/24    PT Start Time 0732    PT Stop Time 0812    PT Time Calculation (min) 40 min    Activity Tolerance Patient tolerated treatment well    Behavior During Therapy [facility] for tasks assessed/performed           Past Medical History:  Diagnosis Date   Anginal pain    History of chicken pox    Hypercholesteremia    Past Surgical History:  Procedure Laterality Date   CARDIAC CATHETERIZATION Left 01/07/2016   Procedure: Left Heart Cath and Coronary Angiography;  Surgeon: Cara JONETTA Lovelace, MD;  Location: [facility] INVASIVE CV [facility];  Service: Cardiovascular;  Laterality: Left;   Patient Active Problem List   Diagnosis Date Noted   Achilles tendon tear 12/18/2023   Right knee pain 04/14/2023   History of pneumonia 02/01/2023   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 04/29/2022   History of COVID-19 08/01/2021   Left shoulder pain 07/27/2021   Respiratory illness 07/12/2021   Acute cough 05/20/2021   Erectile dysfunction 01/30/2021   Right ankle pain 03/12/2020   Stress 10/21/2018   Rash 10/21/2018   Blood in semen 10/21/2018   Memory change 01/14/2018   Hemorrhoid 01/14/2018   History of malignant melanoma of skin 06/15/2017   History of melanoma in situ 05/30/2017   Aortic aneurysm 10/22/2016   Chronic right shoulder pain 05/18/2016   Diastolic dysfunction 04/27/2016   Dilated aortic root 04/27/2016   Adhesive capsulitis of right shoulder 03/16/2016   Light headedness 02/21/2016   Right shoulder pain 02/18/2016   Fatigue 10/14/2015   CAD (coronary artery disease) 10/14/2015   History of colonic polyps 08/19/2015   Hypercholesterolemia 06/22/2015   History of knee surgery 06/22/2015    Health care maintenance 06/22/2015    PCP: Allena Hamilton, MD  REFERRING PROVIDER: Kaitlin M Picone, PA-C  REFERRING DIAG: [phone] (ICD-10-CM) - Strain of left Achilles tendon, initial encounter   Rationale for Evaluation and Treatment: Habilitation  THERAPY DIAG: No diagnosis found.  ONSET DATE: L Achilles tendon repair (DOS 12/13/23)   FOLLOW-UP APPT SCHEDULED WITH REFERRING PROVIDER: Yes ; in 3 weeks from initial eval, next f/u with surgeon's office  PERTINENT HISTORY: Pt is a 63 year old male s/p L Achilles tendon repair (DOS: 12/13/23). Patient reports no post-op complications. Patient reports some tenderness along Achilles tendon region. Pt has upstairs bedroom and scoots on bottom to negotiate steps.   PAIN:    Pain Intensity: Present: 0/10, Worst: 0/10 Pain location: Achilles tendon  Radiating: No  Swelling: Yes ; minor swelling on ankle Numbness/Tingling: Yes; seldom tingling in foot  History of prior back, hip, knee, or ankle injury, pain, surgery, or therapy: Yes; L ankle dislocation in 8th grade, no other significant Hx  Imaging: No  Typical footwear:  Red flags: Negative for personal history of cancer, chills/fever, night sweats, nausea, vomiting, unrelenting pain): Negative  PRECAUTIONS: Other: Weightbearing restriction and no DF beyond neutral   WEIGHT BEARING RESTRICTIONS: Yes 50 lbs PWB, increase 25 lbs each successive week starting 01/11/24  FALLS: Has patient fallen in last 6 months? No  Living Environment Lives with: lives with their spouse and  brother-in-law Lives in: House/apartment Stairs: Yes: Internal: 16 steps; on left going up and External: 3 steps; on right going up (going into garage) Has following equipment at home: Crutches, knee scooter   Prior level of function: Independent  Occupational demands: Photography business  Hobbies: Working on yard, psychologist, clinical  Patient Goals: Return to baseline    OBJECTIVE (data from initial evaluation  unless otherwise dated):   Patient Surveys  LEFS: 21/80 = 26.3%   Gross Musculoskeletal Assessment Bulk: Normal Tone: Normal Moderate ankle edema, mild erythema along incision and ankle. No sign of acute infection or delayed healing.   GAIT: Distance walked: 30 ft Assistive device utilized: Bilateral axillary crutches Level of assistance: SBA Comments: PWB LLE in bilateral axillary crutches in CAM boot only, proper use of upper limb support to offload surgical LE   AROM AROM (Normal range in degrees) AROM  01/11/24 AROM 02/26/24   Right Left Left  Knee     Flexion (135) WNL WNL   Extension (0) WNL WNL        Ankle     Dorsiflexion (20)  -15 12 (18 passively)  Plantarflexion (50)  40 WNL  Inversion (35)  20 35  Eversion (15)  WNL WNL  (* = pain; Blank rows = not tested)   LE MMT: MMT (out of 5) Right 01/11/24 Left 01/11/24 Right 02/26/24 Left 02/26/24 Left 05/08/24  Hip flexion 4- 4     Hip extension       Hip abduction 4+ 4+     Hip adduction       Hip internal rotation       Hip external rotation       Knee flexion 5 5     Knee extension 5 5     Ankle dorsiflexion 5 3+  5 5  Ankle plantarflexion 5 2  3+ (no single- limb heel raise yet) 3+ (no single- limb heel raise yet)  Ankle inversion 5 3+  4 5  Ankle eversion 5 3+  4+ 4+  (* = pain; Blank rows = not tested)  Sensation Grossly intact to light touch throughout bilateral LEs as determined by testing dermatomes L2-S2. Proprioception, stereognosis, and hot/cold testing deferred on this date.  Reflexes Deferred  Palpation Mid-substance Achilles 1, gastroc-Achilles musculotendinous junction 1, insertional Achilles 0, calcaneal tuberosity 0 Graded on 0-4 scale (0 = no pain, 1 = pain, 2 = pain with wincing/grimacing/flinching, 3 = pain with withdrawal, 4 = unwilling to allow palpation), (Blank rows = not tested)  Passive Accessory Motion Deferred  VASCULAR Dorsalis pedis and posterior tibial pulses are  palpable Capillary refill WNL      TODAY'S TREATMENT: DATE: 05/14/2024   SUBJECTIVE STATEMENT:   Patient denies significant complaint at arrival. Patient reports still lacking in calf strength. He feels that it is improving, but he still has notable work to do do achieve single-limb heel raise.    Therapeutic Exercise - for improved soft tissue flexibility and extensibility as needed for ROM, improved strength as needed to improve performance of CKC  activities/functional movements    SciFit seated elliptical; Level 8, x 5 minutes - for improved soft tissue mobility and increased tissue temperature to improve muscle performance    Single limb eccentrics on 6 step (2 up, 1 down) 3 x 10 with BUE support    Total Gym, single-limb heel raise; Level 15;  2 x 10  Total Gym, single-limb eccentrics, 2-up 1-down Level 21;  2 x 10  Single-limb RDL; 2 x 10, with 6-lb Dbell  -demo and cueing for technique, mirror feedback for form   *not today*  Standing heel raise; 2 x 15, slow eccentric phase (3-5 sec), with 9-lb Dbells in bilat hands  Single-limb wobble board (PF/DF); x20 alternating back/forth intermittent UE support on adjacent railing Seated heel raise; 2 x 15, 20-lb cuff weight (10-lb + 10-lb) resting on patient's knee Tandem stance on Airex; x 30 sec each position Tandem walk on Airex; 4x D/B, forward stepping only  Total Gym; Level 22; double-limb heel raise; 3 x 10  Seated towel scrunch; 2 x 1 min, with added 1-lb Dbell on towel Reclined ankle Tband; PF x 20 ea dir with Black Tband  Reclined ankle Tband; inversion and eversion x 10 ea dir with Black Tband   -for HEP review  -PT holding band for INV   Ankle pump; PF/DF AROM as tolerated; x20 into PF and DF Seated ankle inversion/eversion with towel; x 20 Active straight leg raise; 2 x 10    Therapeutic Activity - to improve capacity for gait with less reliance on AD, pre-gait activities to improve gait stability and  adequate weight shift   BOSU squat, on flat side; 2 x 12   Single-leg skater squats (unsupported); 2 x 10   Lateral stepdown; 6-inch step, staircase center of gym; 1 x 12  Lateral stepdown; 12-inch step with Airex on floor to dec height, staircase center of gym; 2 x 10   PATIENT EDUCATION: Discussed current progress and discussed progression of heel raise exercise at home.    *not today* Minisquat, with 10-lb Goblet hold; 2 x 15, with mirror feedback for technique Forward step-up to hurdle step; 12-inch step + Airex, staircase in center of gym; 2 x 12 Total Gym single-limb squat; 3 x 15, Level 22 Forward to retro step along blue agility ladder; 5x D/B Toe tapping on 6-inch step to simulate side-to-side weight shifting with ambulation; 2 x 10, alt Forward gait in hallway, focus on heel to toe progression and symmetrical weight shift; x3 D/B 70-ft hallway   PATIENT EDUCATION:  Education details: see above for patient education details Person educated: Patient Education method: Explanation, Demonstration, and Handouts Education comprehension: verbalized understanding and returned demonstration   HOME EXERCISE PROGRAM:  Access Code: [email or web address] URL: [email or web address] Date: 03/18/2024 Prepared by: Venetia Endo  Exercises - Long Sitting Ankle Plantar Flexion with Resistance  - 1 x daily - 7 x weekly - 2 sets - 10 reps - Ankle Inversion with Resistance  - 1 x daily - 7 x weekly - 2 sets - 10 reps - Seated Heel Raise  - 1-2 x daily - 7 x weekly - 2-3 sets - 10 reps - Standing Heel Raise  - 1-2 x daily - 7 x weekly - 2-3 sets - 15 reps - Single Leg Stance on Foam Pad  - 4-5 x weekly - 3 sets - 30sec hold - Goblet Squat with Kettlebell  - 1 x daily - 4-5 x weekly - 2 sets - 12-15 reps - Lunge with Counter Support  - 1 x daily - 4-5 x weekly - 2 sets - 12-15 reps - Runner's Step Up/Down  - 1 x daily - 4-5 x weekly - 2 sets - 12-15 reps   ASSESSMENT:  CLINICAL  IMPRESSION:   Patient has ongoing calf strength and power deficits limiting his toe-off during terminal stance and his ability to complete single-limb heel raise. We are continuing to progress  with Total Gym and UE-supported heel raise drills and have discussed strategies for progressing calf strengthening at home. Pt has fortunately returned to most daily activities well, and he has excellent capacity for compound lower extremity drills e.g. squat/single-limb squat/lunging. Patient has expected post-operative deficits in ankle/plantarflexor strength, post-op edema, gait stability/changes. Pt will continue to benefit from skilled PT services to address deficits and improve function.   OBJECTIVE IMPAIRMENTS: Abnormal gait, decreased activity tolerance, decreased balance, decreased mobility, difficulty walking, decreased ROM, decreased strength, hypomobility, increased edema, impaired flexibility, and pain.   ACTIVITY LIMITATIONS: lifting, standing, squatting, stairs, transfers, and locomotion level  PARTICIPATION LIMITATIONS: driving, community activity, yard work, and aeronautical engineer  PERSONAL FACTORS: 3 comorbidities (CVD/CAD, anxiety, dyslipidemia) are also affecting patient's functional outcome.   REHAB POTENTIAL: Good  CLINICAL DECISION MAKING: Evolving/moderate complexity  EVALUATION COMPLEXITY: Moderate   GOALS: Goals reviewed with patient? Yes  SHORT TERM GOALS: Target date: 02/01/2024  Pt will be independent with HEP to improve strength and decrease ankle pain to improve pain-free function at home and work. Baseline: 01/11/24: Baseline home exercises reviewed.      02/26/24: Pt recounts exercise well and is maintaining HEP.  Goal status: ACHIEVED  Pt will attain dorsiflexion to 10 degrees or greater by 8 weeks as needed for normalized gait pattern Baseline: 01/11/24: -15 deg DF at baseline/eval.     02/26/24: DF > 10 deg Goal status: ACHIEVED   LONG TERM GOALS: Target date:  05/13/2024  Pt will ambulate with normalized gait pattern for 660 feet or greater without reproduction of ankle pain and without AD or boot indicative of ability to perform normal community-level ambulation Baseline: 01/11/24: Ambulating home-level distance with bilateral crutches and CAM boot, partial weightbearing 25 lbs.     02/26/24: Pt is able to ambulate at community-level distance with moderate gait deviation/antalgic pattern during last 1-2 laps.; 05/08/24: decreased push off on L foot due to weak plantar flexors   Goal status: IN PROGRESS/ON-GOING  2.  Pt will negotiate full flight of steps with reciprocal pattern and no major deviations or pain as needed for negotiating 2-story home with upstairs primary bedroom.  Baseline: 01/11/24: Pt has to scoot on stairs and use knee scooter at top/bottom of steps.     02/26/24: Pt able to complete reciprocal stair climb/descent safety.  Goal status: ACHIEVED   3.  Pt will increase LEFS score to 60/80 or greater in order demonstrate clinically significant improvement in ankle pain/level of function.       Baseline: 01/11/24: 21/80 = 26.3%    02/26/24: 43/80 = 53.8%; 05/08/24: 65/80 = 81.3% Goal status: ACHIEVED  4.  Pt will increase strength of tested ankle musculature to 4+/5 or greater MMT grade in order to demonstrate improvement in strength and function  Baseline: 01/11/24: 2 to 3+ ankle strength per MMT (see chart above).        02/26/24: Met for dorsiflexion and eversion, not met for plantarflexors and inversion.; 05/08/24: Met for DF, eversion, and inversion, continues 3+/5 for plantar flexors (unable to complete single limb heel raise) Goal status: IN PROGRESS   PLAN: PT FREQUENCY: 1-2x/week  PT DURATION: 6-8 weeks  PLANNED INTERVENTIONS: Therapeutic exercises, Therapeutic activity, Neuromuscular re-education, Balance training, Gait training, Patient/Family education, Self Care, Joint mobilization, Joint manipulation, Vestibular training,  Canalith repositioning, Orthotic/Fit training, DME instructions, Dry Needling, Electrical stimulation, Spinal manipulation, Spinal mobilization, Cryotherapy, Moist heat, Taping, Traction, Ultrasound, Ionotophoresis 4mg /ml Dexamethasone, Manual therapy, and Re-evaluation.  PLAN FOR NEXT SESSION: Progressive CKC  loading, double-limb to single-limb calf raise progression, balance and proprioceptive drills. Progress as tolerated per protocol.    Venetia Endo, PT, DPT #[id number]  Venetia ONEIDA Endo, PT 05/14/2024, 7:32 AM

## 2024-05-20 NOTE — Therapy (Unsigned)
 " OUTPATIENT PHYSICAL THERAPY ANKLE POST-OP TREATMENT    Patient Name: Jake Wagner MRN: [id number] DOB:11/30/1960, 63 y.o., male Today's Date: 05/21/2024  END OF SESSION:  PT End of Session - 05/21/24 0730     Visit Number 22    Number of Visits 28    Date for Recertification  05/20/24    PT Start Time 0731    PT Stop Time 0811    PT Time Calculation (min) 40 min    Activity Tolerance Patient tolerated treatment well    Behavior During Therapy [facility] for tasks assessed/performed            Past Medical History:  Diagnosis Date   Anginal pain    History of chicken pox    Hypercholesteremia    Past Surgical History:  Procedure Laterality Date   CARDIAC CATHETERIZATION Left 01/07/2016   Procedure: Left Heart Cath and Coronary Angiography;  Surgeon: Cara JONETTA Lovelace, MD;  Location: [facility] INVASIVE CV LAB;  Service: Cardiovascular;  Laterality: Left;   Patient Active Problem List   Diagnosis Date Noted   Achilles tendon tear 12/18/2023   Right knee pain 04/14/2023   History of pneumonia 02/01/2023   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 04/29/2022   History of COVID-19 08/01/2021   Left shoulder pain 07/27/2021   Respiratory illness 07/12/2021   Acute cough 05/20/2021   Erectile dysfunction 01/30/2021   Right ankle pain 03/12/2020   Stress 10/21/2018   Rash 10/21/2018   Blood in semen 10/21/2018   Memory change 01/14/2018   Hemorrhoid 01/14/2018   History of malignant melanoma of skin 06/15/2017   History of melanoma in situ 05/30/2017   Aortic aneurysm 10/22/2016   Chronic right shoulder pain 05/18/2016   Diastolic dysfunction 04/27/2016   Dilated aortic root 04/27/2016   Adhesive capsulitis of right shoulder 03/16/2016   Light headedness 02/21/2016   Right shoulder pain 02/18/2016   Fatigue 10/14/2015   CAD (coronary artery disease) 10/14/2015   History of colonic polyps 08/19/2015   Hypercholesterolemia 06/22/2015   History of knee surgery 06/22/2015    Health care maintenance 06/22/2015    PCP: Allena Hamilton, MD  REFERRING PROVIDER: Kaitlin M Picone, PA-C  REFERRING DIAG: [phone] (ICD-10-CM) - Strain of left Achilles tendon, initial encounter   Rationale for Evaluation and Treatment: Habilitation  THERAPY DIAG: Pain in left ankle and joints of left foot  Difficulty in walking, not elsewhere classified  Muscle weakness (generalized)  ONSET DATE: L Achilles tendon repair (DOS 12/13/23)   FOLLOW-UP APPT SCHEDULED WITH REFERRING PROVIDER: Yes ; in 3 weeks from initial eval, next f/u with surgeon's office  PERTINENT HISTORY: Pt is a 63 year old male s/p L Achilles tendon repair (DOS: 12/13/23). Patient reports no post-op complications. Patient reports some tenderness along Achilles tendon region. Pt has upstairs bedroom and scoots on bottom to negotiate steps.   PAIN:    Pain Intensity: Present: 0/10, Worst: 0/10 Pain location: Achilles tendon  Radiating: No  Swelling: Yes ; minor swelling on ankle Numbness/Tingling: Yes; seldom tingling in foot  History of prior back, hip, knee, or ankle injury, pain, surgery, or therapy: Yes; L ankle dislocation in 8th grade, no other significant Hx  Imaging: No  Typical footwear:  Red flags: Negative for personal history of cancer, chills/fever, night sweats, nausea, vomiting, unrelenting pain): Negative  PRECAUTIONS: Other: Weightbearing restriction and no DF beyond neutral   WEIGHT BEARING RESTRICTIONS: Yes 50 lbs PWB, increase 25 lbs each successive week starting 01/11/24  FALLS: Has patient fallen in last 6 months? No  Living Environment Lives with: lives with their spouse and brother-in-law Lives in: House/apartment Stairs: Yes: Internal: 16 steps; on left going up and External: 3 steps; on right going up (going into garage) Has following equipment at home: Crutches, knee scooter   Prior level of function: Independent  Occupational demands: Photography business  Hobbies: Working  on yard, psychologist, clinical  Patient Goals: Return to baseline    OBJECTIVE (data from initial evaluation unless otherwise dated):   Patient Surveys  LEFS: 21/80 = 26.3%   Gross Musculoskeletal Assessment Bulk: Normal Tone: Normal Moderate ankle edema, mild erythema along incision and ankle. No sign of acute infection or delayed healing.   GAIT: Distance walked: 30 ft Assistive device utilized: Bilateral axillary crutches Level of assistance: SBA Comments: PWB LLE in bilateral axillary crutches in CAM boot only, proper use of upper limb support to offload surgical LE   AROM AROM (Normal range in degrees) AROM  01/11/24 AROM 02/26/24   Right Left Left  Knee     Flexion (135) WNL WNL   Extension (0) WNL WNL        Ankle     Dorsiflexion (20)  -15 12 (18 passively)  Plantarflexion (50)  40 WNL  Inversion (35)  20 35  Eversion (15)  WNL WNL  (* = pain; Blank rows = not tested)   LE MMT: MMT (out of 5) Right 01/11/24 Left 01/11/24 Right 02/26/24 Left 02/26/24 Left 05/08/24  Hip flexion 4- 4     Hip extension       Hip abduction 4+ 4+     Hip adduction       Hip internal rotation       Hip external rotation       Knee flexion 5 5     Knee extension 5 5     Ankle dorsiflexion 5 3+  5 5  Ankle plantarflexion 5 2  3+ (no single- limb heel raise yet) 3+ (no single- limb heel raise yet)  Ankle inversion 5 3+  4 5  Ankle eversion 5 3+  4+ 4+  (* = pain; Blank rows = not tested)  Sensation Grossly intact to light touch throughout bilateral LEs as determined by testing dermatomes L2-S2. Proprioception, stereognosis, and hot/cold testing deferred on this date.  Reflexes Deferred  Palpation Mid-substance Achilles 1, gastroc-Achilles musculotendinous junction 1, insertional Achilles 0, calcaneal tuberosity 0 Graded on 0-4 scale (0 = no pain, 1 = pain, 2 = pain with wincing/grimacing/flinching, 3 = pain with withdrawal, 4 = unwilling to allow palpation), (Blank rows = not  tested)  Passive Accessory Motion Deferred  VASCULAR Dorsalis pedis and posterior tibial pulses are palpable Capillary refill WNL      TODAY'S TREATMENT: DATE: 05/21/2024   SUBJECTIVE STATEMENT:   Patient reports some numbness around heel - sometimes feels like it's going to sleep on him. Patient reports no notable pain in his heel/ankle at arrival to PT. Patient reports compliance with his HEP.    Therapeutic Exercise - for improved soft tissue flexibility and extensibility as needed for ROM, improved strength as needed to improve performance of CKC  activities/functional movements    SciFit seated elliptical; Level 8, x 5 minutes - for improved soft tissue mobility and increased tissue temperature to improve muscle performance    Total Gym, single-limb heel raise; Level 18;  2 x 10   Single limb eccentrics on 6 step (2 up, 1 down)  3 x 10 with BUE support      Single leg stance on BOSU, round side up, multiple attempts with up to 10-20 sec obtained    Single-limb RDL; 2 x 10, with 8-lb Dbell  -demo and cueing for technique, mirror feedback for form   *not today*  Standing heel raise; 2 x 15, slow eccentric phase (3-5 sec), with 9-lb Dbells in bilat hands  Single-limb wobble board (PF/DF); x20 alternating back/forth intermittent UE support on adjacent railing Seated heel raise; 2 x 15, 20-lb cuff weight (10-lb + 10-lb) resting on patient's knee Tandem stance on Airex; x 30 sec each position Tandem walk on Airex; 4x D/B, forward stepping only  Total Gym; Level 22; double-limb heel raise; 3 x 10  Seated towel scrunch; 2 x 1 min, with added 1-lb Dbell on towel Reclined ankle Tband; PF x 20 ea dir with Black Tband  Reclined ankle Tband; inversion and eversion x 10 ea dir with Black Tband   -for HEP review  -PT holding band for INV   Ankle pump; PF/DF AROM as tolerated; x20 into PF and DF Seated ankle inversion/eversion with towel; x 20 Active straight leg raise; 2 x  10    Therapeutic Activity - to improve capacity for gait with less reliance on AD, pre-gait activities to improve gait stability and adequate weight shift   BOSU squat, on flat side; 2 x 12   Single-leg skater squats (unsupported); 2 x 12   -mirror feedback and cueing to limit overpronation or valgus   Lateral stepdown; 12-inch step with Airex on floor to dec height, staircase center of gym; 1x 10  Lateral stepdown; 12-inch step; 1x 10   PATIENT EDUCATION: Discussed current progress and discussed progression of heel raise exercise at home.    *not today* Lateral stepdown; 6-inch step, staircase center of gym; 1 x 12 Minisquat, with 10-lb Goblet hold; 2 x 15, with mirror feedback for technique Forward step-up to hurdle step; 12-inch step + Airex, staircase in center of gym; 2 x 12 Total Gym single-limb squat; 3 x 15, Level 22 Forward to retro step along blue agility ladder; 5x D/B Toe tapping on 6-inch step to simulate side-to-side weight shifting with ambulation; 2 x 10, alt Forward gait in hallway, focus on heel to toe progression and symmetrical weight shift; x3 D/B 70-ft hallway   PATIENT EDUCATION:  Education details: see above for patient education details Person educated: Patient Education method: Explanation, Demonstration, and Handouts Education comprehension: verbalized understanding and returned demonstration   HOME EXERCISE PROGRAM:  Access Code: [email or web address] URL: [email or web address] Date: 05/21/2024 Prepared by: Venetia Endo  Exercises - Single Leg Stance on Foam Pad  - 4-5 x weekly - 3 sets - 30sec hold - Lunge with Counter Support  - 1 x daily - 4-5 x weekly - 2 sets - 12-15 reps - Standing Lateral Step-Down Heel Tap  - 1 x daily - 4-5 x weekly - 2 sets - 10-12 reps - Eccentric Heel Lowering on Step  - 1 x daily - 4-5 x weekly - 2 sets - 10 reps - Single Leg Heel Raise with Unilateral Counter Support  - 1 x daily - 4-5 x weekly - 2 sets -  10-12 reps   ASSESSMENT:  CLINICAL IMPRESSION:   Patient is able to progress intensity of single-limb calf strengthening and advanced balance/proprioceptive drills. He is continuing into advanced-phase rehab. He does not have single-limb heel raise required to exhibit adequate strength for running/plyometrics. He  is doing remarkably well with LE compound movements (squat, lunge, stepdown) and ADLs. Patient has expected post-operative deficits in ankle/plantarflexor strength/calf power and ability to manage plyometric loads. Pt will continue to benefit from skilled PT services to address deficits and improve function.   OBJECTIVE IMPAIRMENTS: Abnormal gait, decreased activity tolerance, decreased balance, decreased mobility, difficulty walking, decreased ROM, decreased strength, hypomobility, increased edema, impaired flexibility, and pain.   ACTIVITY LIMITATIONS: lifting, standing, squatting, stairs, transfers, and locomotion level  PARTICIPATION LIMITATIONS: driving, community activity, yard work, and aeronautical engineer  PERSONAL FACTORS: 3 comorbidities (CVD/CAD, anxiety, dyslipidemia) are also affecting patient's functional outcome.   REHAB POTENTIAL: Good  CLINICAL DECISION MAKING: Evolving/moderate complexity  EVALUATION COMPLEXITY: Moderate   GOALS: Goals reviewed with patient? Yes  SHORT TERM GOALS: Target date: 02/01/2024  Pt will be independent with HEP to improve strength and decrease ankle pain to improve pain-free function at home and work. Baseline: 01/11/24: Baseline home exercises reviewed.      02/26/24: Pt recounts exercise well and is maintaining HEP.  Goal status: ACHIEVED  Pt will attain dorsiflexion to 10 degrees or greater by 8 weeks as needed for normalized gait pattern Baseline: 01/11/24: -15 deg DF at baseline/eval.     02/26/24: DF > 10 deg Goal status: ACHIEVED   LONG TERM GOALS: Target date: 05/13/2024  Pt will ambulate with normalized gait pattern for 660 feet  or greater without reproduction of ankle pain and without AD or boot indicative of ability to perform normal community-level ambulation Baseline: 01/11/24: Ambulating home-level distance with bilateral crutches and CAM boot, partial weightbearing 25 lbs.     02/26/24: Pt is able to ambulate at community-level distance with moderate gait deviation/antalgic pattern during last 1-2 laps.; 05/08/24: decreased push off on L foot due to weak plantar flexors   Goal status: IN PROGRESS/ON-GOING  2.  Pt will negotiate full flight of steps with reciprocal pattern and no major deviations or pain as needed for negotiating 2-story home with upstairs primary bedroom.  Baseline: 01/11/24: Pt has to scoot on stairs and use knee scooter at top/bottom of steps.     02/26/24: Pt able to complete reciprocal stair climb/descent safety.  Goal status: ACHIEVED   3.  Pt will increase LEFS score to 60/80 or greater in order demonstrate clinically significant improvement in ankle pain/level of function.       Baseline: 01/11/24: 21/80 = 26.3%    02/26/24: 43/80 = 53.8%; 05/08/24: 65/80 = 81.3% Goal status: ACHIEVED  4.  Pt will increase strength of tested ankle musculature to 4+/5 or greater MMT grade in order to demonstrate improvement in strength and function  Baseline: 01/11/24: 2 to 3+ ankle strength per MMT (see chart above).        02/26/24: Met for dorsiflexion and eversion, not met for plantarflexors and inversion.; 05/08/24: Met for DF, eversion, and inversion, continues 3+/5 for plantar flexors (unable to complete single limb heel raise) Goal status: IN PROGRESS   PLAN: PT FREQUENCY: 1-2x/week  PT DURATION: 6-8 weeks  PLANNED INTERVENTIONS: Therapeutic exercises, Therapeutic activity, Neuromuscular re-education, Balance training, Gait training, Patient/Family education, Self Care, Joint mobilization, Joint manipulation, Vestibular training, Canalith repositioning, Orthotic/Fit training, DME instructions, Dry Needling,  Electrical stimulation, Spinal manipulation, Spinal mobilization, Cryotherapy, Moist heat, Taping, Traction, Ultrasound, Ionotophoresis 4mg /ml Dexamethasone, Manual therapy, and Re-evaluation.  PLAN FOR NEXT SESSION: Progressive CKC loading, double-limb to single-limb calf raise progression, balance and proprioceptive drills. Progress as tolerated per protocol.    Venetia Endo, PT, DPT [phone]  Venetia ONEIDA Endo, PT 05/21/2024, 7:31 AM  "

## 2024-05-21 ENCOUNTER — Ambulatory Visit: Admitting: Physical Therapy

## 2024-05-21 ENCOUNTER — Encounter: Payer: Self-pay | Admitting: Physical Therapy

## 2024-05-21 DIAGNOSIS — M25572 Pain in left ankle and joints of left foot: Secondary | ICD-10-CM

## 2024-05-21 DIAGNOSIS — M6281 Muscle weakness (generalized): Secondary | ICD-10-CM

## 2024-05-21 DIAGNOSIS — R262 Difficulty in walking, not elsewhere classified: Secondary | ICD-10-CM

## 2024-05-28 ENCOUNTER — Ambulatory Visit: Admitting: Physical Therapy

## 2024-06-11 ENCOUNTER — Encounter: Payer: Self-pay | Admitting: Physical Therapy

## 2024-06-11 ENCOUNTER — Ambulatory Visit: Attending: Orthopedic Surgery | Admitting: Physical Therapy

## 2024-06-11 DIAGNOSIS — M25572 Pain in left ankle and joints of left foot: Secondary | ICD-10-CM | POA: Insufficient documentation

## 2024-06-11 DIAGNOSIS — R262 Difficulty in walking, not elsewhere classified: Secondary | ICD-10-CM | POA: Insufficient documentation

## 2024-06-11 DIAGNOSIS — M6281 Muscle weakness (generalized): Secondary | ICD-10-CM | POA: Diagnosis present

## 2024-06-11 NOTE — Therapy (Signed)
 " OUTPATIENT PHYSICAL THERAPY ANKLE POST-OP TREATMENT/GOAL UPDATE AND RE-CERTIFICATION   Patient Name: Jake Wagner MRN: [id number] DOB:1960-11-25, 64 y.o., male Today's Date: 06/11/2024  END OF SESSION:  PT End of Session - 06/11/24 0830     Visit Number 23    PT Start Time 0821    PT Stop Time 0900    PT Time Calculation (min) 39 min    Activity Tolerance Patient tolerated treatment well    Behavior During Therapy WFL for tasks assessed/performed            Past Medical History:  Diagnosis Date   Anginal pain    History of chicken pox    Hypercholesteremia    Past Surgical History:  Procedure Laterality Date   CARDIAC CATHETERIZATION Left 01/07/2016   Procedure: Left Heart Cath and Coronary Angiography;  Surgeon: Cara JONETTA Lovelace, MD;  Location: [facility];  Service: Cardiovascular;  Laterality: Left;   Patient Active Problem List   Diagnosis Date Noted   Achilles tendon tear 12/18/2023   Right knee pain 04/14/2023   History of pneumonia 02/01/2023   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 04/29/2022   History of COVID-19 08/01/2021   Left shoulder pain 07/27/2021   Respiratory illness 07/12/2021   Acute cough 05/20/2021   Erectile dysfunction 01/30/2021   Right ankle pain 03/12/2020   Stress 10/21/2018   Rash 10/21/2018   Blood in semen 10/21/2018   Memory change 01/14/2018   Hemorrhoid 01/14/2018   History of malignant melanoma of skin 06/15/2017   History of melanoma in situ 05/30/2017   Aortic aneurysm 10/22/2016   Chronic right shoulder pain 05/18/2016   Diastolic dysfunction 04/27/2016   Dilated aortic root 04/27/2016   Adhesive capsulitis of right shoulder 03/16/2016   Light headedness 02/21/2016   Right shoulder pain 02/18/2016   Fatigue 10/14/2015   CAD (coronary artery disease) 10/14/2015   History of colonic polyps 08/19/2015   Hypercholesterolemia 06/22/2015   History of knee surgery 06/22/2015   Health care maintenance  06/22/2015    PCP: Allena Hamilton, MD  REFERRING PROVIDER: Kaitlin M Picone, PA-C  REFERRING DIAG: [phone] (ICD-10-CM) - Strain of left Achilles tendon, initial encounter   Rationale for Evaluation and Treatment: Habilitation  THERAPY DIAG: Pain in left ankle and joints of left foot  Difficulty in walking, not elsewhere classified  Muscle weakness (generalized)  ONSET DATE: L Achilles tendon repair (DOS 12/13/23)   FOLLOW-UP APPT SCHEDULED WITH REFERRING PROVIDER: Yes ; in 3 weeks from initial eval, next f/u with surgeon's office  PERTINENT HISTORY: Pt is a 64 year old male s/p L Achilles tendon repair (DOS: 12/13/23). Patient reports no post-op complications. Patient reports some tenderness along Achilles tendon region. Pt has upstairs bedroom and scoots on bottom to negotiate steps.   PAIN:    Pain Intensity: Present: 0/10, Worst: 0/10 Pain location: Achilles tendon  Radiating: No  Swelling: Yes ; minor swelling on ankle Numbness/Tingling: Yes; seldom tingling in foot  History of prior back, hip, knee, or ankle injury, pain, surgery, or therapy: Yes; L ankle dislocation in 8th grade, no other significant Hx  Imaging: No  Typical footwear:  Red flags: Negative for personal history of cancer, chills/fever, night sweats, nausea, vomiting, unrelenting pain): Negative  PRECAUTIONS: Other: Weightbearing restriction and no DF beyond neutral   WEIGHT BEARING RESTRICTIONS: Yes 50 lbs PWB, increase 25 lbs each successive week starting 01/11/24  FALLS: Has patient fallen in last 6 months? No  Living Environment Lives  with: lives with their spouse and brother-in-law Lives in: House/apartment Stairs: Yes: Internal: 16 steps; on left going up and External: 3 steps; on right going up (going into garage) Has following equipment at home: Crutches, knee scooter   Prior level of function: Independent  Occupational demands: Photography business  Hobbies: Working on yard,  psychologist, clinical  Patient Goals: Return to baseline    OBJECTIVE (data from initial evaluation unless otherwise dated):   Patient Surveys  LEFS: 21/80 = 26.3%   Gross Musculoskeletal Assessment Bulk: Normal Tone: Normal Moderate ankle edema, mild erythema along incision and ankle. No sign of acute infection or delayed healing.   GAIT: Distance walked: 30 ft Assistive device utilized: Bilateral axillary crutches Level of assistance: SBA Comments: PWB LLE in bilateral axillary crutches in CAM boot only, proper use of upper limb support to offload surgical LE   AROM AROM (Normal range in degrees) AROM  01/11/24 AROM 02/26/24   Right Left Left  Knee     Flexion (135) WNL WNL   Extension (0) WNL WNL        Ankle     Dorsiflexion (20)  -15 12 (18 passively)  Plantarflexion (50)  40 WNL  Inversion (35)  20 35  Eversion (15)  WNL WNL  (* = pain; Blank rows = not tested)   LE MMT: MMT (out of 5) Right 01/11/24 Left 01/11/24 Right 02/26/24 Left 02/26/24 Left 05/08/24  Hip flexion 4- 4     Hip extension       Hip abduction 4+ 4+     Hip adduction       Hip internal rotation       Hip external rotation       Knee flexion 5 5     Knee extension 5 5     Ankle dorsiflexion 5 3+  5 5  Ankle plantarflexion 5 2  3+ (no single- limb heel raise yet) 3+ (no single- limb heel raise yet)  Ankle inversion 5 3+  4 5  Ankle eversion 5 3+  4+ 4+  (* = pain; Blank rows = not tested)  Sensation Grossly intact to light touch throughout bilateral LEs as determined by testing dermatomes L2-S2. Proprioception, stereognosis, and hot/cold testing deferred on this date.  Reflexes Deferred  Palpation Mid-substance Achilles 1, gastroc-Achilles musculotendinous junction 1, insertional Achilles 0, calcaneal tuberosity 0 Graded on 0-4 scale (0 = no pain, 1 = pain, 2 = pain with wincing/grimacing/flinching, 3 = pain with withdrawal, 4 = unwilling to allow palpation), (Blank rows = not  tested)  Passive Accessory Motion Deferred  VASCULAR Dorsalis pedis and posterior tibial pulses are palpable Capillary refill WNL      TODAY'S TREATMENT: DATE: 06/11/2024   SUBJECTIVE STATEMENT:   Patient had flu around Christmas time and is recovering from this. He's had limited participation with HEP due to illness. Pt reports ongoing challenge with calf strength. Pt reports that it typically feels numb and weak in surgical calf/Achilles. Pt has returned to most normal ADLs, but he is not yet able to do any high-impact activity.    *GOAL UPDATE PERFORMED   Therapeutic Exercise - for improved soft tissue flexibility and extensibility as needed for ROM, improved strength as needed to improve performance of CKC  activities/functional movements    SciFit seated elliptical; Level 8, x 5 minutes - for improved soft tissue mobility and increased tissue temperature to improve muscle performance    Total Gym, single-limb heel raise;  Level 20;  1 x 10 Level 22;  2 x 10   Single limb eccentrics on 6 step (2 up, 1 down) 2 x 10 with BUE support      Single leg stance on BOSU, round side up, multiple attempts with up to 10-20 sec obtained    Single-limb RDL; 2 x 10, with 9-lb Dbell  -demo and cueing for technique, mirror feedback for form   *not today*  Standing heel raise; 2 x 15, slow eccentric phase (3-5 sec), with 9-lb Dbells in bilat hands  Single-limb wobble board (PF/DF); x20 alternating back/forth intermittent UE support on adjacent railing Seated heel raise; 2 x 15, 20-lb cuff weight (10-lb + 10-lb) resting on patient's knee Tandem stance on Airex; x 30 sec each position Tandem walk on Airex; 4x D/B, forward stepping only  Total Gym; Level 22; double-limb heel raise; 3 x 10  Seated towel scrunch; 2 x 1 min, with added 1-lb Dbell on towel Reclined ankle Tband; PF x 20 ea dir with Black Tband  Reclined ankle Tband; inversion and eversion x 10 ea dir with Black Tband    -for HEP review  -PT holding band for INV   Ankle pump; PF/DF AROM as tolerated; x20 into PF and DF Seated ankle inversion/eversion with towel; x 20 Active straight leg raise; 2 x 10    Therapeutic Activity - to improve capacity for gait with less reliance on AD, pre-gait activities to improve gait stability and adequate weight shift    Single-leg skater squats (unsupported); 2 x 12   -mirror feedback and cueing to limit overpronation or valgus   Lateral stepdown; 12-inch step with Airex on floor to dec height, staircase center of gym; 1x 10  Lateral stepdown; 12-inch step; 1x 10   PATIENT EDUCATION: Discussed current progress/PT POC and discussed progression of heel raise exercise at home.    *not today* BOSU squat, on flat side; 2 x 12 Lateral stepdown; 6-inch step, staircase center of gym; 1 x 12 Minisquat, with 10-lb Goblet hold; 2 x 15, with mirror feedback for technique Forward step-up to hurdle step; 12-inch step + Airex, staircase in center of gym; 2 x 12 Total Gym single-limb squat; 3 x 15, Level 22 Forward to retro step along blue agility ladder; 5x D/B Toe tapping on 6-inch step to simulate side-to-side weight shifting with ambulation; 2 x 10, alt Forward gait in hallway, focus on heel to toe progression and symmetrical weight shift; x3 D/B 70-ft hallway   PATIENT EDUCATION:  Education details: see above for patient education details Person educated: Patient Education method: Explanation, Demonstration, and Handouts Education comprehension: verbalized understanding and returned demonstration   HOME EXERCISE PROGRAM:  Access Code: [email or web address] URL: [email or web address] Date: 06/11/2024 Prepared by: Venetia Endo  Exercises - Single Leg Stance on Foam Pad  - 4-5 x weekly - 3 sets - 30sec hold - Single-Leg Quarter Squat   - 2 x daily - 4-5 x weekly - 2 sets - 10 reps - Standing Lateral Step-Down Heel Tap  - 1 x daily - 4-5 x weekly - 2 sets -  10-12 reps - Eccentric Heel Lowering on Step  - 1 x daily - 4-5 x weekly - 2 sets - 10 reps - Single Leg Heel Raise with Unilateral Counter Support  - 1 x daily - 4-5 x weekly - 2 sets - 10-12 reps   ASSESSMENT:  CLINICAL IMPRESSION:   Patient has made substantial progress to date and has met most PT goals; his  need for skilled PT intervention is decreasing, and we have increased emphasis on home-based progressive strengthening. Pt has current deficits primarily related to plantarflexor strength/power; he still is limited with single-limb heel raise on affected limb, though this has slowly improved. Pt has returned to most normal ADLs and independent community-level ambulation, but he is not yet ready for managing plyometric loads and higher-impact activity. Patient has expected post-operative deficits in ankle/plantarflexor strength/calf power and ability to manage plyometric loads. Pt will continue to benefit from skilled PT services to address deficits and improve function.   OBJECTIVE IMPAIRMENTS: Abnormal gait, decreased activity tolerance, decreased balance, decreased mobility, difficulty walking, decreased ROM, decreased strength, hypomobility, increased edema, impaired flexibility, and pain.   ACTIVITY LIMITATIONS: lifting, standing, squatting, stairs, transfers, and locomotion level  PARTICIPATION LIMITATIONS: driving, community activity, yard work, and aeronautical engineer  PERSONAL FACTORS: 3 comorbidities (CVD/CAD, anxiety, dyslipidemia) are also affecting patient's functional outcome.   REHAB POTENTIAL: Good  CLINICAL DECISION MAKING: Evolving/moderate complexity  EVALUATION COMPLEXITY: Moderate   GOALS: Goals reviewed with patient? Yes  SHORT TERM GOALS: Target date: 02/01/2024  Pt will be independent with HEP to improve strength and decrease ankle pain to improve pain-free function at home and work. Baseline: 01/11/24: Baseline home exercises reviewed.      02/26/24: Pt recounts  exercise well and is maintaining HEP.  Goal status: ACHIEVED  Pt will attain dorsiflexion to 10 degrees or greater by 8 weeks as needed for normalized gait pattern Baseline: 01/11/24: -15 deg DF at baseline/eval.     02/26/24: DF > 10 deg Goal status: ACHIEVED   LONG TERM GOALS: Target date: 05/13/2024  Pt will ambulate with normalized gait pattern for 660 feet or greater without reproduction of ankle pain and without AD or boot indicative of ability to perform normal community-level ambulation Baseline: 01/11/24: Ambulating home-level distance with bilateral crutches and CAM boot, partial weightbearing 25 lbs.     02/26/24: Pt is able to ambulate at community-level distance with moderate gait deviation/antalgic pattern during last 1-2 laps.; 05/08/24: decreased push off on L foot due to weak plantar flexors. 06/11/24: Adequate push-off and WNL gait pattern for home-level distance; dec push-off at higher-distance gait.  Goal status: MOSTLY MET/IN PROGRESS  2.  Pt will negotiate full flight of steps with reciprocal pattern and no major deviations or pain as needed for negotiating 2-story home with upstairs primary bedroom.  Baseline: 01/11/24: Pt has to scoot on stairs and use knee scooter at top/bottom of steps.     02/26/24: Pt able to complete reciprocal stair climb/descent safety.  Goal status: ACHIEVED   3.  Pt will increase LEFS score to 60/80 or greater in order demonstrate clinically significant improvement in ankle pain/level of function.       Baseline: 01/11/24: 21/80 = 26.3%    02/26/24: 43/80 = 53.8%; 05/08/24: 65/80 = 81.3% Goal status: ACHIEVED  4.  Pt will increase strength of tested ankle musculature to 4+/5 or greater MMT grade in order to demonstrate improvement in strength and function  Baseline: 01/11/24: 2 to 3+ ankle strength per MMT (see chart above).        02/26/24: Met for dorsiflexion and eversion, not met for plantarflexors and inversion.; 05/08/24: Met for DF, eversion, and  inversion, continues 3+/5 for plantar flexors (unable to complete single limb heel raise). 06/11/24: Pt able to complete partial-range single-limb heel raise Goal status: IN PROGRESS   PLAN: PT FREQUENCY: 1x every 1-2 weeks  PT DURATION: 4-6 weeks  PLANNED  INTERVENTIONS: Therapeutic exercises, Therapeutic activity, Neuromuscular re-education, Balance training, Gait training, Patient/Family education, Self Care, Joint mobilization, Joint manipulation, Vestibular training, Canalith repositioning, Orthotic/Fit training, DME instructions, Dry Needling, Electrical stimulation, Spinal manipulation, Spinal mobilization, Cryotherapy, Moist heat, Taping, Traction, Ultrasound, Ionotophoresis 4mg /ml Dexamethasone, Manual therapy, and Re-evaluation.  PLAN FOR NEXT SESSION: Progressive CKC loading, double-limb to single-limb calf raise progression, balance and proprioceptive drills. Progress as tolerated per protocol.    Venetia Endo, PT, DPT #[id number]  Venetia ONEIDA Endo, PT 06/11/2024, 8:31 AM  "

## 2024-06-24 ENCOUNTER — Ambulatory Visit: Admitting: Physical Therapy

## 2024-08-12 ENCOUNTER — Ambulatory Visit: Admitting: Internal Medicine
